# Patient Record
Sex: Male | Born: 1940 | Race: Black or African American | Hispanic: No | Marital: Married | Smoking: Former smoker
Health system: Southern US, Community
[De-identification: ages and names within clinical notes are randomized; demographics above are authoritative.]

## PROBLEM LIST (undated history)

## (undated) DIAGNOSIS — C799 Secondary malignant neoplasm of unspecified site: Secondary | ICD-10-CM

## (undated) DIAGNOSIS — C801 Malignant (primary) neoplasm, unspecified: Secondary | ICD-10-CM

## (undated) DIAGNOSIS — Z5189 Encounter for other specified aftercare: Secondary | ICD-10-CM

## (undated) DIAGNOSIS — R011 Cardiac murmur, unspecified: Secondary | ICD-10-CM

## (undated) HISTORY — PX: OTHER SURGICAL HISTORY: SHX169

## (undated) HISTORY — PX: ROTATOR CUFF REPAIR: SHX139

## (undated) HISTORY — PX: COLON SURGERY: SHX602

---
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
## 1998-10-04 ENCOUNTER — Ambulatory Visit: Admission: RE | Admit: 1998-10-04 | Discharge: 1998-10-04 | Payer: Self-pay | Admitting: Family Medicine

## 2001-05-01 ENCOUNTER — Ambulatory Visit (HOSPITAL_COMMUNITY): Admission: RE | Admit: 2001-05-01 | Discharge: 2001-05-01 | Payer: Self-pay | Admitting: Family Medicine

## 2001-05-01 ENCOUNTER — Encounter: Payer: Self-pay | Admitting: Family Medicine

## 2001-09-19 ENCOUNTER — Encounter: Payer: Self-pay | Admitting: Family Medicine

## 2001-09-19 ENCOUNTER — Ambulatory Visit (HOSPITAL_COMMUNITY): Admission: RE | Admit: 2001-09-19 | Discharge: 2001-09-19 | Payer: Self-pay | Admitting: Family Medicine

## 2004-05-09 ENCOUNTER — Emergency Department (HOSPITAL_COMMUNITY): Admission: EM | Admit: 2004-05-09 | Discharge: 2004-05-09 | Payer: Self-pay | Admitting: Emergency Medicine

## 2007-05-01 HISTORY — PX: ROBOT ASSISTED LAPAROSCOPIC RADICAL PROSTATECTOMY: SHX5141

## 2007-09-24 ENCOUNTER — Inpatient Hospital Stay (HOSPITAL_COMMUNITY): Admission: AD | Admit: 2007-09-24 | Discharge: 2007-09-25 | Payer: Self-pay | Admitting: Internal Medicine

## 2007-11-15 ENCOUNTER — Ambulatory Visit: Payer: Self-pay | Admitting: Internal Medicine

## 2007-11-15 ENCOUNTER — Inpatient Hospital Stay (HOSPITAL_COMMUNITY): Admission: EM | Admit: 2007-11-15 | Discharge: 2007-11-19 | Payer: Self-pay | Admitting: Emergency Medicine

## 2007-11-17 ENCOUNTER — Encounter (INDEPENDENT_AMBULATORY_CARE_PROVIDER_SITE_OTHER): Payer: Self-pay | Admitting: Internal Medicine

## 2007-12-03 ENCOUNTER — Ambulatory Visit (HOSPITAL_COMMUNITY): Admission: RE | Admit: 2007-12-03 | Discharge: 2007-12-03 | Payer: Self-pay | Admitting: Urology

## 2008-01-01 ENCOUNTER — Inpatient Hospital Stay (HOSPITAL_COMMUNITY): Admission: RE | Admit: 2008-01-01 | Discharge: 2008-01-02 | Payer: Self-pay | Admitting: Urology

## 2008-01-01 ENCOUNTER — Encounter (INDEPENDENT_AMBULATORY_CARE_PROVIDER_SITE_OTHER): Payer: Self-pay | Admitting: Urology

## 2008-01-10 ENCOUNTER — Inpatient Hospital Stay (HOSPITAL_COMMUNITY): Admission: EM | Admit: 2008-01-10 | Discharge: 2008-01-12 | Payer: Self-pay | Admitting: Emergency Medicine

## 2008-01-27 ENCOUNTER — Ambulatory Visit: Admission: RE | Admit: 2008-01-27 | Discharge: 2008-02-03 | Payer: Self-pay | Admitting: Radiation Oncology

## 2008-04-16 ENCOUNTER — Ambulatory Visit: Admission: RE | Admit: 2008-04-16 | Discharge: 2008-06-10 | Payer: Self-pay | Admitting: Radiation Oncology

## 2008-07-30 ENCOUNTER — Ambulatory Visit: Admission: RE | Admit: 2008-07-30 | Discharge: 2008-10-28 | Payer: Self-pay | Admitting: Radiation Oncology

## 2009-04-11 ENCOUNTER — Encounter (INDEPENDENT_AMBULATORY_CARE_PROVIDER_SITE_OTHER): Payer: Self-pay | Admitting: Surgery

## 2009-04-11 ENCOUNTER — Inpatient Hospital Stay (HOSPITAL_COMMUNITY): Admission: RE | Admit: 2009-04-11 | Discharge: 2009-04-14 | Payer: Self-pay | Admitting: Surgery

## 2009-04-25 ENCOUNTER — Inpatient Hospital Stay (HOSPITAL_COMMUNITY): Admission: AD | Admit: 2009-04-25 | Discharge: 2009-05-05 | Payer: Self-pay | Admitting: Surgery

## 2009-04-25 ENCOUNTER — Encounter: Admission: RE | Admit: 2009-04-25 | Discharge: 2009-04-25 | Payer: Self-pay | Admitting: Surgery

## 2009-05-16 ENCOUNTER — Encounter: Admission: RE | Admit: 2009-05-16 | Discharge: 2009-05-16 | Payer: Self-pay | Admitting: Surgery

## 2009-08-01 ENCOUNTER — Ambulatory Visit (HOSPITAL_COMMUNITY): Admission: RE | Admit: 2009-08-01 | Discharge: 2009-08-01 | Payer: Self-pay | Admitting: Family Medicine

## 2009-08-05 ENCOUNTER — Inpatient Hospital Stay (HOSPITAL_COMMUNITY): Admission: RE | Admit: 2009-08-05 | Discharge: 2009-08-08 | Payer: Self-pay | Admitting: Surgery

## 2009-08-05 ENCOUNTER — Encounter (INDEPENDENT_AMBULATORY_CARE_PROVIDER_SITE_OTHER): Payer: Self-pay | Admitting: Surgery

## 2009-11-25 ENCOUNTER — Ambulatory Visit (HOSPITAL_COMMUNITY): Admission: RE | Admit: 2009-11-25 | Discharge: 2009-11-25 | Payer: Self-pay | Admitting: Urology

## 2009-12-08 ENCOUNTER — Ambulatory Visit (HOSPITAL_COMMUNITY): Admission: RE | Admit: 2009-12-08 | Discharge: 2009-12-08 | Payer: Self-pay | Admitting: Urology

## 2010-03-10 ENCOUNTER — Emergency Department (HOSPITAL_COMMUNITY): Admission: EM | Admit: 2010-03-10 | Discharge: 2010-03-10 | Payer: Self-pay | Admitting: Emergency Medicine

## 2010-03-17 ENCOUNTER — Ambulatory Visit (HOSPITAL_COMMUNITY)
Admission: RE | Admit: 2010-03-17 | Discharge: 2010-03-17 | Payer: Self-pay | Source: Home / Self Care | Admitting: Urology

## 2010-04-30 HISTORY — PX: EYE SURGERY: SHX253

## 2010-05-21 ENCOUNTER — Encounter: Payer: Self-pay | Admitting: Surgery

## 2010-06-21 ENCOUNTER — Other Ambulatory Visit: Payer: Self-pay | Admitting: Urology

## 2010-06-21 ENCOUNTER — Ambulatory Visit (HOSPITAL_COMMUNITY)
Admission: RE | Admit: 2010-06-21 | Discharge: 2010-06-21 | Disposition: A | Discharge status: Home / Self Care | Payer: Medicare Other | Source: Ambulatory Visit | Attending: Urology | Admitting: Urology

## 2010-06-21 ENCOUNTER — Encounter (HOSPITAL_COMMUNITY): Payer: Medicare Other

## 2010-06-21 DIAGNOSIS — Z01812 Encounter for preprocedural laboratory examination: Secondary | ICD-10-CM | POA: Insufficient documentation

## 2010-06-21 DIAGNOSIS — Z01818 Encounter for other preprocedural examination: Secondary | ICD-10-CM | POA: Insufficient documentation

## 2010-06-21 LAB — CBC
HCT: 38.5 % — ABNORMAL LOW (ref 39.0–52.0)
MCHC: 30.9 g/dL (ref 30.0–36.0)
Platelets: 223 10*3/uL (ref 150–400)
RDW: 14.7 % (ref 11.5–15.5)
WBC: 5.6 10*3/uL (ref 4.0–10.5)

## 2010-06-21 LAB — BASIC METABOLIC PANEL
Calcium: 9.4 mg/dL (ref 8.4–10.5)
GFR calc Af Amer: 54 mL/min — ABNORMAL LOW (ref >60)
GFR calc non Af Amer: 45 mL/min — ABNORMAL LOW (ref >60)
Potassium: 4.5 mEq/L (ref 3.5–5.1)
Sodium: 139 mEq/L (ref 135–145)

## 2010-06-21 LAB — SURGICAL PCR SCREEN
MRSA, PCR: NEGATIVE
Staphylococcus aureus: NEGATIVE

## 2010-06-22 ENCOUNTER — Ambulatory Visit (HOSPITAL_COMMUNITY)
Admission: RE | Admit: 2010-06-22 | Discharge: 2010-06-22 | Disposition: A | Discharge status: Home / Self Care | Payer: Medicare Other | Source: Ambulatory Visit | Attending: Urology | Admitting: Urology

## 2010-06-22 DIAGNOSIS — N189 Chronic kidney disease, unspecified: Secondary | ICD-10-CM | POA: Insufficient documentation

## 2010-06-22 DIAGNOSIS — C801 Malignant (primary) neoplasm, unspecified: Secondary | ICD-10-CM | POA: Insufficient documentation

## 2010-06-22 DIAGNOSIS — Z85038 Personal history of other malignant neoplasm of large intestine: Secondary | ICD-10-CM | POA: Insufficient documentation

## 2010-06-22 DIAGNOSIS — C61 Malignant neoplasm of prostate: Secondary | ICD-10-CM | POA: Insufficient documentation

## 2010-06-22 DIAGNOSIS — N135 Crossing vessel and stricture of ureter without hydronephrosis: Secondary | ICD-10-CM | POA: Insufficient documentation

## 2010-06-22 DIAGNOSIS — Z01812 Encounter for preprocedural laboratory examination: Secondary | ICD-10-CM | POA: Insufficient documentation

## 2010-06-24 NOTE — Op Note (Signed)
  NAME:  Willie Gaines, Willie Gaines                 ACCOUNT NO.:  [id number]  MEDICAL RECORD NO.:  [id number]           PATIENT TYPE:  O  LOCATION:  DAYL                         FACILITY:  [facility]  PHYSICIAN:  Heloise Purpura, MD      DATE OF BIRTH:  1941/01/10  DATE OF PROCEDURE:  06/22/2010 DATE OF DISCHARGE:                              OPERATIVE REPORT   PREOPERATIVE DIAGNOSES: 1. Metastatic prostate cancer. 2. Left ureteral obstruction. 3. Chronic kidney disease.  POSTOPERATIVE DIAGNOSIS: 1. Metastatic prostate cancer. 2. Left ureteral obstruction. 3. Chronic kidney disease.  PROCEDURES: 1. Cystoscopy. 2. Left ureteral stent change (6 x 24).  SURGEON:  Heloise Purpura, MD  ANESTHESIA:  General.  COMPLICATIONS:  None.  INDICATIONS:  [name] is a 70 year old gentleman with left ureteral obstruction thought to be related to metastatic prostate cancer.  His left ureteral obstruction has been managed with an indwelling left ureteral stent, and he presents today for stent change.  The potential risks, complications, and alternative treatment options have been discussed in detail and informed consent obtained.  DESCRIPTION OF PROCEDURE:  The patient was taken to the operating room and a general anesthetic was administered.  He was given preoperative antibiotics, placed in the dorsal lithotomy position, and prepped and draped in the usual sterile fashion.  Next, a preoperative time-out was performed.  Cystourethroscopy was then performed which revealed a normal anterior urethra.  The prostate was surgically absent, and there was no evidence of a bladder neck contracture.  Inspection of the bladder revealed a partially encrusted left ureteral stent.  This was able to be brought out to the urethral meatus with flexible graspers without difficulty.  A 0.038 Sensor guidewire was then advanced through the stent up into the renal pelvis without significant difficulty.  The wire was then back  loaded on the cystoscope and a new 6 x 24 double-J ureteral stent was advanced over the wire using Seldinger technique.  He was positioned appropriately under fluoroscopic and cystoscopic guidance, and the wire was removed with a good curl noted in the renal pelvis as well as in the bladder.  The bladder was emptied and the procedure was ended.  He tolerated the procedure well and without complications.  He was able to be awakened and transferred to the recovery unit in satisfactory condition.     Heloise Purpura, MD     LB/MEDQ  D:  06/22/2010  T:  06/23/2010  Job:  [id number]  Electronically Signed by Heloise Purpura MD on 06/24/2010 01:20:03 PM

## 2010-07-11 LAB — COMPREHENSIVE METABOLIC PANEL
ALT: 20 U/L (ref 0–53)
Albumin: 4 g/dL (ref 3.5–5.2)
Calcium: 9.3 mg/dL (ref 8.4–10.5)
Glucose, Bld: 126 mg/dL — ABNORMAL HIGH (ref 70–99)
Sodium: 137 mEq/L (ref 135–145)
Total Protein: 7.2 g/dL (ref 6.0–8.3)

## 2010-07-11 LAB — BASIC METABOLIC PANEL
BUN: 25 mg/dL — ABNORMAL HIGH (ref 6–23)
CO2: 23 mEq/L (ref 19–32)
Calcium: 9.4 mg/dL (ref 8.4–10.5)
Chloride: 105 mEq/L (ref 96–112)
Creatinine, Ser: 1.74 mg/dL — ABNORMAL HIGH (ref 0.4–1.5)
Creatinine, Ser: 2.31 mg/dL — ABNORMAL HIGH (ref 0.4–1.5)
GFR calc Af Amer: 47 mL/min — ABNORMAL LOW (ref >60)
GFR calc non Af Amer: 28 mL/min — ABNORMAL LOW (ref >60)
Glucose, Bld: 94 mg/dL (ref 70–99)
Glucose, Bld: 96 mg/dL (ref 70–99)
Potassium: 4.4 mEq/L (ref 3.5–5.1)
Sodium: 137 mEq/L (ref 135–145)

## 2010-07-11 LAB — SURGICAL PCR SCREEN
MRSA, PCR: NEGATIVE
Staphylococcus aureus: NEGATIVE

## 2010-07-11 LAB — CBC
HCT: 35.2 % — ABNORMAL LOW (ref 39.0–52.0)
MCHC: 33.3 g/dL (ref 30.0–36.0)
Platelets: 272 10*3/uL (ref 150–400)
RDW: 15.4 % (ref 11.5–15.5)
WBC: 5 10*3/uL (ref 4.0–10.5)

## 2010-07-14 LAB — BASIC METABOLIC PANEL
CO2: 24 mEq/L (ref 19–32)
Glucose, Bld: 101 mg/dL — ABNORMAL HIGH (ref 70–99)
Potassium: 4.2 mEq/L (ref 3.5–5.1)
Sodium: 140 mEq/L (ref 135–145)

## 2010-07-14 LAB — CBC
HCT: 36.5 % — ABNORMAL LOW (ref 39.0–52.0)
Hemoglobin: 12.1 g/dL — ABNORMAL LOW (ref 13.0–17.0)
MCV: 70 fL — ABNORMAL LOW (ref 78.0–100.0)
RBC: 5.21 MIL/uL (ref 4.22–5.81)
WBC: 5.6 10*3/uL (ref 4.0–10.5)

## 2010-07-16 LAB — COMPREHENSIVE METABOLIC PANEL
ALT: 16 U/L (ref 0–53)
CO2: 22 mEq/L (ref 19–32)
Calcium: 7.6 mg/dL — ABNORMAL LOW (ref 8.4–10.5)
GFR calc non Af Amer: >60 mL/min (ref >60)
Glucose, Bld: 123 mg/dL — ABNORMAL HIGH (ref 70–99)
Sodium: 132 mEq/L — ABNORMAL LOW (ref 135–145)

## 2010-07-16 LAB — CBC
HCT: 31 % — ABNORMAL LOW (ref 39.0–52.0)
Hemoglobin: 10 g/dL — ABNORMAL LOW (ref 13.0–17.0)
Hemoglobin: 10.1 g/dL — ABNORMAL LOW (ref 13.0–17.0)
Hemoglobin: 9.3 g/dL — ABNORMAL LOW (ref 13.0–17.0)
MCHC: 32.7 g/dL (ref 30.0–36.0)
MCHC: 32.7 g/dL (ref 30.0–36.0)
MCV: 73.8 fL — ABNORMAL LOW (ref 78.0–100.0)
MCV: 74.4 fL — ABNORMAL LOW (ref 78.0–100.0)
Platelets: 597 10*3/uL — ABNORMAL HIGH (ref 150–400)
RBC: 4.13 MIL/uL — ABNORMAL LOW (ref 4.22–5.81)
RDW: 18.7 % — ABNORMAL HIGH (ref 11.5–15.5)
RDW: 19 % — ABNORMAL HIGH (ref 11.5–15.5)

## 2010-07-16 LAB — DIFFERENTIAL
Basophils Absolute: 0 10*3/uL (ref 0.0–0.1)
Lymphocytes Relative: 9 % — ABNORMAL LOW (ref 12–46)
Monocytes Absolute: 0.8 10*3/uL (ref 0.1–1.0)
Neutro Abs: 7 10*3/uL (ref 1.7–7.7)
Neutrophils Relative %: 80 % — ABNORMAL HIGH (ref 43–77)

## 2010-07-16 LAB — CREATININE, SERUM: GFR calc non Af Amer: >60 mL/min (ref >60)

## 2010-07-16 LAB — PREALBUMIN: Prealbumin: 14.6 mg/dL — ABNORMAL LOW (ref 18.0–45.0)

## 2010-07-19 LAB — BASIC METABOLIC PANEL
BUN: 16 mg/dL (ref 6–23)
BUN: 21 mg/dL (ref 6–23)
CO2: 21 mEq/L (ref 19–32)
Calcium: 9.4 mg/dL (ref 8.4–10.5)
Chloride: 109 mEq/L (ref 96–112)
Chloride: 109 mEq/L (ref 96–112)
Creatinine, Ser: 1.5 mg/dL (ref 0.4–1.5)
Creatinine, Ser: 1.53 mg/dL — ABNORMAL HIGH (ref 0.4–1.5)
GFR calc Af Amer: 46 mL/min — ABNORMAL LOW (ref >60)
GFR calc Af Amer: 55 mL/min — ABNORMAL LOW (ref >60)
GFR calc non Af Amer: 38 mL/min — ABNORMAL LOW (ref >60)
GFR calc non Af Amer: 46 mL/min — ABNORMAL LOW (ref >60)
Glucose, Bld: 92 mg/dL (ref 70–99)
Potassium: 3.9 mEq/L (ref 3.5–5.1)
Potassium: 4.3 mEq/L (ref 3.5–5.1)
Potassium: 4.3 mEq/L (ref 3.5–5.1)
Sodium: 133 mEq/L — ABNORMAL LOW (ref 135–145)
Sodium: 135 mEq/L (ref 135–145)

## 2010-07-19 LAB — CBC
HCT: 28.9 % — ABNORMAL LOW (ref 39.0–52.0)
HCT: 38 % — ABNORMAL LOW (ref 39.0–52.0)
Hemoglobin: 9 g/dL — ABNORMAL LOW (ref 13.0–17.0)
MCHC: 32.6 g/dL (ref 30.0–36.0)
MCHC: 32.6 g/dL (ref 30.0–36.0)
MCV: 72.4 fL — ABNORMAL LOW (ref 78.0–100.0)
MCV: 72.8 fL — ABNORMAL LOW (ref 78.0–100.0)
Platelets: 265 10*3/uL (ref 150–400)
Platelets: 323 10*3/uL (ref 150–400)
RBC: 3.81 MIL/uL — ABNORMAL LOW (ref 4.22–5.81)
RDW: 17.6 % — ABNORMAL HIGH (ref 11.5–15.5)
WBC: 5.8 10*3/uL (ref 4.0–10.5)
WBC: 5.8 10*3/uL (ref 4.0–10.5)
WBC: 6.9 10*3/uL (ref 4.0–10.5)

## 2010-07-31 LAB — CBC
HCT: 21.7 % — ABNORMAL LOW (ref 39.0–52.0)
HCT: 24.7 % — ABNORMAL LOW (ref 39.0–52.0)
HCT: 28 % — ABNORMAL LOW (ref 39.0–52.0)
Hemoglobin: 7.8 g/dL — ABNORMAL LOW (ref 13.0–17.0)
Hemoglobin: 7.9 g/dL — ABNORMAL LOW (ref 13.0–17.0)
Hemoglobin: 9.1 g/dL — ABNORMAL LOW (ref 13.0–17.0)
MCHC: 32.7 g/dL (ref 30.0–36.0)
MCV: 72.9 fL — ABNORMAL LOW (ref 78.0–100.0)
Platelets: 171 10*3/uL (ref 150–400)
Platelets: 417 10*3/uL — ABNORMAL HIGH (ref 150–400)
Platelets: 539 10*3/uL — ABNORMAL HIGH (ref 150–400)
RBC: 3.19 MIL/uL — ABNORMAL LOW (ref 4.22–5.81)
RBC: 3.37 MIL/uL — ABNORMAL LOW (ref 4.22–5.81)
RDW: 15.5 % (ref 11.5–15.5)
RDW: 16.3 % — ABNORMAL HIGH (ref 11.5–15.5)
RDW: 17.7 % — ABNORMAL HIGH (ref 11.5–15.5)
WBC: 8.2 10*3/uL (ref 4.0–10.5)

## 2010-07-31 LAB — CROSSMATCH

## 2010-07-31 LAB — BASIC METABOLIC PANEL
BUN: 17 mg/dL (ref 6–23)
BUN: 19 mg/dL (ref 6–23)
CO2: 25 mEq/L (ref 19–32)
Calcium: 6.8 mg/dL — ABNORMAL LOW (ref 8.4–10.5)
Calcium: 7.1 mg/dL — ABNORMAL LOW (ref 8.4–10.5)
Creatinine, Ser: 1.72 mg/dL — ABNORMAL HIGH (ref 0.4–1.5)
GFR calc Af Amer: >60 mL/min (ref >60)
GFR calc Af Amer: >60 mL/min (ref >60)
GFR calc non Af Amer: 40 mL/min — ABNORMAL LOW (ref >60)
GFR calc non Af Amer: 59 mL/min — ABNORMAL LOW (ref >60)
GFR calc non Af Amer: >60 mL/min (ref >60)
GFR calc non Af Amer: >60 mL/min (ref >60)
Glucose, Bld: 102 mg/dL — ABNORMAL HIGH (ref 70–99)
Glucose, Bld: 138 mg/dL — ABNORMAL HIGH (ref 70–99)
Potassium: 3.7 mEq/L (ref 3.5–5.1)
Potassium: 3.9 mEq/L (ref 3.5–5.1)
Sodium: 132 mEq/L — ABNORMAL LOW (ref 135–145)
Sodium: 132 mEq/L — ABNORMAL LOW (ref 135–145)
Sodium: 135 mEq/L (ref 135–145)

## 2010-07-31 LAB — URINE MICROSCOPIC-ADD ON

## 2010-07-31 LAB — ANAEROBIC CULTURE

## 2010-07-31 LAB — URINALYSIS, ROUTINE W REFLEX MICROSCOPIC
Glucose, UA: NEGATIVE mg/dL
Nitrite: NEGATIVE
Specific Gravity, Urine: 1.026 (ref 1.005–1.030)
pH: 5 (ref 5.0–8.0)

## 2010-07-31 LAB — HEMOGLOBIN: Hemoglobin: 7.4 g/dL — ABNORMAL LOW (ref 13.0–17.0)

## 2010-07-31 LAB — LIPID PANEL: VLDL: 29 mg/dL (ref 0–40)

## 2010-07-31 LAB — LIPASE, BLOOD: Lipase: <10 U/L — ABNORMAL LOW (ref 11–59)

## 2010-07-31 LAB — TISSUE CULTURE

## 2010-07-31 LAB — HEMOGLOBIN AND HEMATOCRIT, BLOOD: Hemoglobin: 7.6 g/dL — ABNORMAL LOW (ref 13.0–17.0)

## 2010-08-01 LAB — CBC
HCT: 26.2 % — ABNORMAL LOW (ref 39.0–52.0)
HCT: 34.1 % — ABNORMAL LOW (ref 39.0–52.0)
Hemoglobin: 13.7 g/dL (ref 13.0–17.0)
MCHC: 31.7 g/dL (ref 30.0–36.0)
MCHC: 32.5 g/dL (ref 30.0–36.0)
MCHC: 32.5 g/dL (ref 30.0–36.0)
MCV: 72.3 fL — ABNORMAL LOW (ref 78.0–100.0)
MCV: 72.7 fL — ABNORMAL LOW (ref 78.0–100.0)
MCV: 72.9 fL — ABNORMAL LOW (ref 78.0–100.0)
Platelets: 196 10*3/uL (ref 150–400)
Platelets: 256 10*3/uL (ref 150–400)
RBC: 3.6 MIL/uL — ABNORMAL LOW (ref 4.22–5.81)
RDW: 15.3 % (ref 11.5–15.5)
RDW: 15.5 % (ref 11.5–15.5)
WBC: 10.8 10*3/uL — ABNORMAL HIGH (ref 4.0–10.5)
WBC: 8.3 10*3/uL (ref 4.0–10.5)

## 2010-08-01 LAB — BASIC METABOLIC PANEL
BUN: 9 mg/dL (ref 6–23)
CO2: 26 mEq/L (ref 19–32)
Chloride: 105 mEq/L (ref 96–112)
Potassium: 3.9 mEq/L (ref 3.5–5.1)

## 2010-08-01 LAB — POTASSIUM: Potassium: 4.3 mEq/L (ref 3.5–5.1)

## 2010-08-01 LAB — CREATININE, SERUM: GFR calc Af Amer: >60 mL/min (ref >60)

## 2010-09-12 NOTE — H&P (Signed)
NAME:  Willie Gaines, CASTENEDA                 ACCOUNT NO.:  [id number]   MEDICAL RECORD NO.:  [id number]          PATIENT TYPE:  INP   LOCATION:  1228                         FACILITY:  [facility]   PHYSICIAN:  Bertram Millard. Dahlstedt, M.D.DATE OF BIRTH:  03/16/1941   DATE OF ADMISSION:  01/10/2008  DATE OF DISCHARGE:                              HISTORY & PHYSICAL   REASON FOR ADMISSION:  History of recent prostatectomy with significant  lower pelvic pain.   BRIEF HISTORY:  Willie Gaines is a 70 year old male who is just over a  week out from a robotic assisted laparoscopic radical prostatectomy and  bilateral pelvic lymph node dissection.  That was performed on September  3 by Dr. Heloise Purpura.  The patient had an uneventful postoperative  recovery.  He followed up with Dr. Laverle Patter in the office yesterday and  had a voiding trial.  He voided well and the catheter was left out.  About 8:00 at night yesterday he started experiencing some abdominal  pain.  Overnight, he had a low urinary output.  He called me this  morning and stated that he has had some abdominal pain, had rectal  urgency and I recommended that he come to the emergency room.  That was  at approximately 9:00 a.m.  He did not present until about 2:00 p.m., as  his wife did not want to bring him in because he was sleeping.  At 2:00  he had increased abdominal pain.  He still had rectal urgency which came  in spells.  He had some nausea and vomiting as well as heaves.  He has  not had fever or chills.  He has had a low urinary output, dribbling  fairly clear urine.  For the past few days he has been passing gas and  has had a few bowel movements.  His last bowel movement was yesterday.  In the emergency room it took a fair amount of pain medicine to get the  patient comfortable.  At this time chemistries are pending except for a  creatinine of 1.47.  His hematocrit is 48%, platelets are normal and  white count is not elevated - it is 9000.   CT with p.o. contrast was  eventually performed.  I viewed the images.  There is expected bilateral  iliac edema.  There is no significant free fluid in the abdomen.  There  is no significant perirectal edema or fluid collection.  There is a  minimal amount of urine within the bladder.  The patient is being  admitted for pain management, observation and IV antibiotic  administration.   PAST MEDICAL HISTORY:  Is significant for a UTI with sepsis after  transrectal ultrasound biopsy of the prostate.  He spent 3 days in the  hospital back in July.  He is status post appendectomy years ago.  He  has borderline hypertension and hyperlipidemia, both better controlled  without medications.   ADMISSION MEDICATIONS:  At time of admission included just Vicodin and  Bactrim.   ALLERGIES AND INTOLERANCES:  The patient apparently has nausea from  AMOXICILLIN.  No  specific allergies, however.   SOCIAL HISTORY:  The patient is married.  He is retired.  He does not  use tobacco or alcohol.   FAMILY HISTORY:  Significant for pancreatic cancer.   PHYSICAL EXAMINATION:  GENERAL:  Exam revealed a pleasant but sedated 35-  year-old male, appearing younger than his stated age.  VITAL SIGNS:  As recorded.  He had no supraclavicular cervical  adenopathy.  LUNGS:  Lung sounds were clear bilaterally.  HEART:  Normal rate and rhythm.  He had a grade 2/6 systolic ejection  murmur heard best at the left upper sternal border.  ABDOMEN/GU:  Abdomen was slightly distended.  Laparoscopic incisions  were all healing well.  There is no redness or discharge.  No specific  tenderness in these areas.  His abdomen was slightly tender in the  suprapubic area.  There was no guarding or rigidity.  There was no CVA  tenderness.  Bowel sounds were diminished.  Phallus circumcised.  No  lesions.  No discharge of blood at the meatus.  Scrotal skin and  testicles normal.  He had no peripheral edema.   LABORATORY:  At this  time include basically a normal CBC and a pending  comprehensive metabolic panel (this has been drawn twice but specimen  was inadequate).  Creatinine was 1.47.  CT urogram as dictated in the  HPI.   IMPRESSION:  1. Prostate cancer.  Status post robotic assisted laparoscopic radical      prostatectomy on the 3rd of this month.  Pathologic stage T3B N0      MX.  Gleason 4+3.  Less than 24 hours after removal of his catheter      he has had increased pelvic pain.  2. Pelvic/lower abdominal pain.  I am not quite sure of the etiology      of this yet.  I do suspect that he may have something related to      the catheter coming out yesterday - perhaps a very small urine      leak.  I am not concerned at this point about significant bowel      injury as he is a week out.  However, we need to rule out      perirectal injury.   PLAN:  1. I will admit and place on broad-spectrum IV antibiotics, pain      medications and IV hydration.  2. He will be n.p.o. at the present time.  3. In the morning I will repeat a CT scan with bladder, followed by      rectal contrast.  4. I have spoken with the patient and his family regarding our plan of      action.      Bertram Millard. Dahlstedt, [name]  Electronically Signed     SMD/MEDQ  D:  01/10/2008  T:  01/11/2008  Job:  [id number]

## 2010-09-12 NOTE — Discharge Summary (Signed)
NAME:  Willie Gaines, Willie Gaines                 ACCOUNT NO.:  [id number]   MEDICAL RECORD NO.:  [id number]          PATIENT TYPE:  INP   LOCATION:  1416                         FACILITY:  [facility]   PHYSICIAN:  Hollice Espy, M.D.DATE OF BIRTH:  [birth year]   DATE OF ADMISSION:  11/15/2007  DATE OF DISCHARGE:  11/19/2007                               DISCHARGE SUMMARY   PRIMARY CARE PHYSICIAN:  [name] A. Nicholos Johns, [name]   UROLOGIST:  Lindaann Slough, [name]   DISCHARGE DIAGNOSES:  1. Escherichia coli urinary tract infection.  2. Secondary sepsis.  3. Urinary retention secondary to enlarged prostate.  4. Microcytic anemia.   HOSPITAL COURSE:  The patient is a 70 year old African American male  with a history of borderline hypertension, hyperlipidemia, not on any  medications, who has been following with Dr. Brunilda Payor for an enlarged  prostate and underwent a prostate biopsy on November 13, 2007.  One day  later, he started complaining of fevers, chills and fatigue, and by November 15, 2007, he was feeling worse and weak.  He came into the emergency  room.  In the emergency room, he was noted be hypotensive and  tachycardic, had a temperature of 102.7 and a heart rate of 118.  Initial urinalysis  was not very impressive, and the attending physician  noted the patient to have a 2/6 systolic ejection murmur.  There was a  concern about the possibility of endocarditis, and the patient was  started on vancomycin and gentamicin with a 2-D echo ordered.  By  hospital day 2, the patient was still feeling rough and febrile.  He was  having extreme difficulty urinating, complaining of severe pain and  unable to void.  Given his urology history, I suspected that he may have  urinary retention leading to secondary UTI and possible sepsis.  A Foley  catheter was placed.  A repeat urinalysis was sent.  Please note that  the patient received Cipro before his procedure on the 16th prior to  coming in.  On the evening of  hospital day 2, the patient's blood  cultures came back positive for gram-negative rods.  At this point, he  was changed over to IV Rocephin.  He continued to spike temperatures,  and his antibiotics were changed to IV Primaxin given a penicillin  allergy and vancomycin because at this point it was not confirmed yet by  echo whether he did indeed have endocarditis and despite being on  Rocephin had spiked further febrile temps.  By November 17, 2007, the  patient was feeling better.  His white count had finally come down to  9.6.  His cultures came back noted as E. coli sensitive to  cephalosporins but resistant to Cipro.  The patient was tolerating well  on Primaxin and vancomycin.  A 2-D echo was done to ensure that there  were no other sources of infection.  This came back negative for any  signs of endocarditis although some moderate tricuspid regurg was noted.  By November 17, 2007, the patient was doing well.  I spoke with Dr. Brunilda Payor  about the patient's prostate biopsy results.  These are not yet back,  and Dr. Brunilda Payor said that he would follow up with the patient upon  discharge for these biopsy results.  In addition, when I notified him  about the patient's difficulty voiding, he recommended starting the  patient on Flomax 0.4 mg, and he said that the patient's Foley could be  discontinued prior to discharge.  In the interim prior to this, I spoke  with the patient about whether or not he had any cataracts.  He tells me  that he does not, and therefore we find it is safe to go ahead and start  Flomax in the interim.  By July 21, the patient is feeling markedly  better.  The plan will be to discontinue the Foley today, continue  Flomax, making sure that he voids.  We will also discontinue vancomycin  given that we have seen no evidence of any gram-positive infection.  If  the patient does well, then the plan will be to discharge him home on  November 19, 2007, with p.o. Ceftin.  He will follow up  either on July 22 or  July 23 with Dr. Brunilda Payor of urology.  The patient's other medical issues  were stable during this hospitalization.  Given his mild findings of  heart disease based on his echo, would recommend starting him on  antihypertensives down the line, but we will defer of course to his PCP.  The patient's overall disposition from initial presentation is improved.  Activity will be slowly increased.  His discharge diet will be a low-  sodium diet, and he will follow up with Dr. Brunilda Payor upon discharge and Dr.  Nicholos Johns 1 week after discharge.  He will be discharged to home.   DISCHARGE MEDICATIONS:  1. Ceftin 500 mg p.o. b.i.d. x11 days.  2. Flomax 0.4 p.o. daily.      Hollice Espy, [name]  Electronically Signed     SKK/MEDQ  D:  11/18/2007  T:  11/18/2007  Job:  [id number]   cc:   Lindaann Slough, [name]  Fax: [phone]   Elana Alm. Nicholos Johns, [name]  Fax: [phone]

## 2010-09-12 NOTE — H&P (Signed)
NAME:  Willie Gaines, Willie Gaines                 ACCOUNT NO.:  [id number]   MEDICAL RECORD NO.:  [id number]          PATIENT TYPE:  INP   LOCATION:  5120                         FACILITY:  [facility]   PHYSICIAN:  Hollice Espy, M.D.DATE OF BIRTH:  11-23-1940   DATE OF ADMISSION:  09/24/2007  DATE OF DISCHARGE:                              HISTORY & PHYSICAL   PRIMARY CARE PHYSICIAN:  [name], [name]   CHIEF COMPLAINT:  Abdominal pain with nausea and vomiting.   HISTORY OF PRESENT ILLNESS:  The patient is a 70 year old African-  American male with a past medical history of borderline hypertension and  hyperlipidemia who for the last several days has had the problems with  nausea and vomiting unable to keep anything down.  When he was evaluated  by Dr. Benjaman Pott partner, Dr. Wynelle Link, today, in the office; there was a  concern that the patient may have pancreatitis.  He was complaining of  some mid epigastric to left upper quadrant abdominal pain.  The patient  had labs done in the office which showed an amylase level elevated to  264, but a lipase level was only 52.  The rest of his labs were  evaluated, and were essentially normal; but with these findings, Dr. Wynelle Link  was concerned about the possibility of pancreatitis; and because the  patient was unable to keep anything down, it was felt best that he come  in for further evaluation and treatment.  She contacted the [facility]  hospitalist, and the patient was made as a direct admission.   The patient came up to the floor, and on the floor the patient was  complaining of some mild abdominal pain.  He feels slightly nauseated.  He describes the abdominal pain as mostly in the left upper quadrant, as  well as, he complains of some burning and pain starting from the  midepigastric area going up, consistent with some esophageal pain.  He  denies any headaches, vision changes, or dysphagia.  He denies any chest  pain, palpitations shortness breath, wheeze,  or cough.  He describes  abdominal pain as above, but no hematuria, dysuria, constipation,  diarrhea, bloody stools, dark black, or bright red.  No focal extremity  numbness, weakness, or pain.   REVIEW OF SYSTEMS:  Otherwise negative.   PAST MEDICAL HISTORY INCLUDES:  1. Hypertension which is borderline and currently being watched.  2. Hyperlipidemia which he took himself off of medication for.  3. A recent tooth extraction status post he took amoxicillin for a      dental cavity.  4. Obesity.   MEDICATIONS:  1. The patient recently took a course of amoxicillin.  2. He was on Lipitor, but stopped taking this.  3. He is not on any antihypertensives.   ALLERGIES:  No known drug allergies.   SOCIAL HISTORY:  No tobacco, heavy alcohol, or drug use.   FAMILY HISTORY:  Noted for pancreatic cancer.   PHYSICAL EXAMINATION:  VITALS:  On admission temperature 99.4, heart  rate 79, respirations 20, blood pressure 165/90, O2 saturation 99% on  room air.  GENERAL:  He is alert and oriented x3 in no apparent distress.  HEENT:  Normocephalic atraumatic.  His mucous membranes are moist.  NECK:  He has no carotid bruits.  HEART:  Regular rate and rhythm, S1-S2, with a 2/6 systolic ejection  murmur.  LUNGS:  Clear to auscultation bilaterally.  ABDOMEN:  Soft, nontender some mild distention.  Hypoactive bowel  sounds.  EXTREMITIES:  No clubbing, cyanosis, or edema.   LAB WORK:  I am repeating his CMET, lipase and CBC.  In the meantime,  his labs in the office, note a sodium 137, potassium 4.2, chloride 98,  bicarb 27, BUN 9, creatinine 0.9, glucose is 140.  LFTs are noted for an  albumin of 4.1, but essentially unremarkable.  His lipase level was on  the high end of normal at 52, his amylase level was at 264.  He has a  white count of 10.7 with an 83.8% shift.  His H&H is 14.7/45 with an MCV  of 71.   ASSESSMENT AND PLAN:  1. Abdominal pain.  This is more left-sided, upper quadrant with  some      what looks to be severe reflux.  His lipase is borderline, so there      is a question of whether or not this is true pancreatitis.  We will      repeat his labs, and check an abdominal CT with contrast, while in      the meantime symptomatically controlled.  2. Hypertension, borderline.  This is slightly elevated now because of      pain.  Continue to monitor.  3. History of hyperlipidemia.  4. Microcytic anemia.  No history in review of old records.  We will      further work this up.      Hollice Espy, [name]  Electronically Signed     SKK/MEDQ  D:  09/24/2007  T:  09/24/2007  Job:  [id number]   cc:   [name] A. Nicholos Johns, [name]

## 2010-09-12 NOTE — Discharge Summary (Signed)
NAME:  Willie Gaines, Willie Gaines                 ACCOUNT NO.:  [id number]   MEDICAL RECORD NO.:  [id number]          PATIENT TYPE:  INP   LOCATION:  1426                         FACILITY:  [facility]   PHYSICIAN:  Heloise Purpura, MD      DATE OF BIRTH:  1941/01/15   DATE OF ADMISSION:  01/01/2008  DATE OF DISCHARGE:  01/02/2008                               DISCHARGE SUMMARY   ADMISSION DIAGNOSIS:  Clinically localized adenocarcinoma of the  prostate versus possibly locally advanced adenocarcinoma of the  prostate.   POSTOPERATIVE DIAGNOSIS:  Clinically localized adenocarcinoma of the  prostate versus possibly locally advanced adenocarcinoma of the  prostate.   HISTORY AND PHYSICAL:  For full details, please see admission history  and physical.  Briefly, Willie Gaines is a 70 year old otherwise healthy  gentleman who was recently diagnosed with a high-grade, clinically  localized, adenocarcinoma of the prostate.  His digital rectal exam was  concerning for possibly locally advanced disease.  After a thorough  discussion regarding management options for treatment including androgen  deprivation in conjunction with external beam radiation therapy versus  multimodality therapy with a primary surgical approach, the patient  elected to proceed with radical prostatectomy and extended pelvic  lymphadenectomy.   HOSPITAL COURSE:  On January 01, 2008, the patient was taken to the  operating room and underwent a robotic-assisted laparoscopic radical  prostatectomy and extended bilateral pelvic lymphadenectomy.  He  tolerated this procedure well and without complications.  Postoperatively, was able to be transferred to a regular hospital room  following recovery from anesthesia.  His able to begin ambulating the  night of surgery, which he did without difficulty.  He was monitored  that evening and remained hemodynamically stable.  His hematocrit was  checked on postoperative day #1 and was found to be stable at  37.2.  He  did have excellent urine output, although did have slightly increased  output from his pelvic drain.  He was begun on a clear liquid diet which  he tolerated without difficulty and was transitioned to oral pain  medication.  His JP fluid was checked for a creatinine level on  postoperative day #1, due to the slightly increased output, and was  found to be 2.3.  A basic metabolic panel was then drawn and his serum  creatinine was found to be 1.2.  Based on  urine output, it was felt that if he did have a urine leak that it was  quite small.  In addition, it was felt that his JP creatinine level was  due likely to some urine that was in the field during surgery, but  likely did not result in a significant urine leak.  His drain was,  therefore, removed.  Based on the fact that he did not appear to be  developing renal insufficiency and was otherwise doing well, it was  decided to have him discharged home in excellent condition.   DISPOSITION:  Home.   DISCHARGE MEDICATIONS:  He was instructed to begin Vicodin as needed for  pain and told to use Colace as a stool softener.  Based on his history  of a resistant E. coli prostatitis, he was given antibiotic therapy with  Bactrim which did cover his previously resistant E. coli.   DISCHARGE INSTRUCTIONS:  He was instructed to be ambulatory but  specifically told to refrain from any heavy lifting, strenuous activity,  or driving.  He was given instructions on routine Foley catheter care.   FOLLOW UP:  He will follow-up in 1 week for removal of the Foley  catheter.  I will plan to have him undergo a cystogram at that time to  exclude any urine leak.      Heloise Purpura, MD  Electronically Signed     LB/MEDQ  D:  01/02/2008  T:  01/04/2008  Job:  [id number]

## 2010-09-12 NOTE — H&P (Signed)
NAME:  Willie Gaines, PROCH                 ACCOUNT NO.:  [id number]   MEDICAL RECORD NO.:  [id number]          PATIENT TYPE:  INP   LOCATION:  0102                         FACILITY:  [facility]   PHYSICIAN:  Therisa Doyne, MD    DATE OF BIRTH:  [birth year]   DATE OF ADMISSION:  11/15/2007  DATE OF DISCHARGE:                              HISTORY & PHYSICAL   This is an admission to the [facility] Team.   PRIMARY CARE Dashay Giesler:  Alanson Puls, [name]   CHIEF COMPLAINT:  Fever.   HISTORY OF PRESENT ILLNESS:  A 70 year old African American male who  presents with a past medical history significant for hypertension,  hyperlipidemia, who presents to the emergency room for evaluation of  fevers and fatigue.  The patient had been in his usual state of health  until approximately 1 month when he had oral surgery.  His oral surgery  was somewhat complicated and they recommended that he take amoxicillin  for 10 days.  However after beginning to take the amoxicillin, the  patient experienced nausea and fatigue.  He was seen and evaluated in  the emergency department and felt that his symptoms may be related to  the amoxicillin and this medication was discontinued.  The patient did  well for the next several weeks until November 13, 2007 when he underwent a  prostate biopsy which was approached transrectally.  The procedure went  well on November 13, 2007.  However on November 14, 2007, the patient reported  fevers up to 102, chills, fatigue and a dry cough.  Because of these  symptoms, he came to the emergency department for evaluation.  He denies  any chest pain, shortness of breath, rashes, joint pain or myalgias.   In the emergency department, the patient was given vancomycin and  gentamicin as well as 2 liters of normal saline.   REVIEW OF SYSTEMS:  All systems were reviewed and are negative except as  mentioned above in the history of present illness.   PAST MEDICAL HISTORY:  1. Borderline hypertension not on  any medications.  2. Borderline hyperlipidemia, diet controlled.   MEDICATIONS:  None.   ALLERGIES:  AMOXICILLIN, however, this may have been just a side effect  of nausea and not a true allergic reaction.   SOCIAL HISTORY:  The patient lives in [place].  He is retired.  He  denies tobacco, alcohol or drug use.   FAMILY HISTORY:  Positive for prostate disease.   PHYSICAL EXAMINATION:  VITAL SIGNS:  Temperature 102.7 on admission  which has now defervesced to 98.7, blood pressure 143/93, pulse 118,  respirations 20, oxygen saturation 97% on room air.  GENERAL:  No acute distress.  HEENT:  Normocephalic, atraumatic.  Pupils are equal, round, and  reactive to light and accommodation.  Extraocular movements are intact.  Oropharynx pink and moist without any lesions.  NECK:  Supple.  There is 3-4 cm of jugular venous distention.  CARDIOVASCULAR:  Regular rate and rhythm with a 3/6 systolic murmur  heard best at the left upper sternal border, right upper sternal border  and left lower sternal border.  No rubs or gallops were appreciated.  CHEST:  Clear to auscultation bilaterally.  ABDOMEN:  Positive bowel sounds, soft, nontender, nondistended.  EXTREMITIES:  No clubbing, cyanosis or edema.  SKIN:  No rashes.  BACK:  No CVA tenderness.  NEUROLOGIC:  Nonfocal examination.  No Janeway lesions or Osler's nodes.   LABORATORY VALUES:  White blood cell count 12,000, hemoglobin 14.4,  platelets 194.  CMP revealed potassium 3.4, total bilirubin of 1.4.  Urinalysis showed 7-10 white blood cells, 11-20 red blood cells,  negative nitrates.   DIAGNOSTICS:  1. Chest x-ray:  No acute cardiopulmonary disease.  2. EKG:  Sinus tachycardia with PACs, left atrial enlargement and no      ST/T wave changes.   ASSESSMENT/PLAN:  A 70 year old African American male who presents with  fever up to 102 degrees Fahrenheit, leukocytosis, systolic murmur all of  which are occurring after a recent  urological procedure and recent oral  surgery.   1. We will admit the patient to the [facility], Dr. Aurelio Jew care.   1. Fevers.  Concerning for endocarditis given his recent dental      procedure and urological procedure.  This is supported by the fact      that he has fevers, constitutional symptoms, leukocytosis and a      somewhat new heart murmur.  His heart murmur was noted on his      previous history and physical back in May.  The patient reports      that he did have a transthoracic echocardiogram performed at [facility] several years ago.  Because of the concern      for endocarditis, the patient was given vancomycin and gentamicin      in the emergency department.  We will continue these medications      empirically until more definitive data returns.  At this point, he      does not meet criteria to be diagnosed with endocarditis.  We will      check blood cultures from 2 different sites daily for the next 3      days.  Check transthoracic echocardiogram.  Consider a      transesophageal echocardiogram. Check erythrocytes, sedimentation      rate, CRP, rheumatoid factor and urine culture.   1. Fluids, electrolytes, nutrition:  Normal saline at 75 cc/hour.      Electrolytes are stable. Regular diet.   1. DVT prophylaxis with Lovenox.      Therisa Doyne, MD  Electronically Signed     SJT/MEDQ  D:  11/15/2007  T:  11/15/2007  Job:  [id number]

## 2010-09-12 NOTE — Op Note (Signed)
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   PROCEDURES:  1. Robotic-assisted laparoscopic radical prostatectomy (non-nerve-      sparing).  2. Extended bilateral pelvic lymphadenectomy.   SURGEON:  Heloise Purpura, MD   ASSISTANT:  Delia Chimes, nurse practitioner.   ANESTHESIA:  General.   COMPLICATIONS:  None.   ESTIMATED BLOOD LOSS:  250 mL.   INTRAVENOUS FLUIDS:  2 L of lactated Ringer's.   SPECIMENS:  1. Prostate and seminal vesicles.  2. Right pelvic lymph nodes.  3. Left pelvic lymph nodes.   DISPOSITION OF SPECIMENS:  TO pathology.   DRAINS:  1. A 20-French coude catheter.  2. A #19 Blake pelvic drain.   INDICATIONS:  Mr. 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  He underwent a metastatic  evaluation, which was negative.  We had a thorough discussion regarding  the significant risks that he might have of micro-metastatic disease.  After our discussion, he did elect to proceed with aggressive  multimodality therapy and decided to proceed with a radical  prostatectomy as his initial treatment.  Potential risks, complications,  and alternative options were discussed in detail with the patient and  informed consent was obtained.   DESCRIPTION OF PROCEDURE:  The patient was taken to the operating room  and a general anesthetic  was administered.  He was given preoperative  antibiotics, placed in the dorsal lithotomy position, and prepped and  draped in the usual sterile fashion.  Next a preoperative time-out was  performed.  A Foley catheter was then inserted into the bladder and a  site was selected just to the left of the umbilicus for placement of the  camera port.  This was placed using a standard open Hasson technique,  which allowed entry into the peritoneal cavity under direct vision  without difficulty.  A 12-mm port was then placed and a 0-degree lens  was used to inspect the abdomen and there was no evidence for any intra-  abdominal injuries or other abnormalities.  The remaining ports were  then placed bilateral 8-mm robotic ports placed 10 cm lateral to and  just inferior to the camera port site.  An additional 8-mm robotic port  was placed in the far left lateral abdominal wall.  A 5-mm port was  placed between the camera port and the right robotic port and an  additional 12-mm port was placed in the far right lateral abdominal wall  for laparoscopic assistance.  All ports were placed under direct vision  and without difficulty.  The surgical cart was then docked.  With the  aid of the cautery scissors, the bladder was reflected posteriorly  allowing entry into space of Retzius and identification of the  endopelvic fascia and prostate.  The endopelvic fascia was then incised  from the apex back to the base of the prostate bilaterally and the  underlying levator muscle fibers were swept laterally off the prostate.  This isolated the dorsal venous complex, which was then stapled and  divided with a 45-mm flex ETS stapler.  The bladder neck was identified  with the aid of Foley catheter manipulation and was divided anteriorly,  thereby exposing the Foley catheter.  The catheter balloon was deflated  and the catheter was brought into the operative field and used to  retract the prostate anteriorly.   This exposed the posterior bladder  neck, which was then divided, and dissection proceeded posteriorly  between the bladder and prostate until the vasa deferentia and seminal  vesicles were identified.  The vasa deferentia were isolated, divided  and lifted anteriorly.  These structures were noted to be markedly  fibrotic and adherent to the surrounding structures.  The seminal  vesicles were also identified and appeared to be extremely adherent to  the surrounding fatty tissue.  These structures were dissected down to  their tips with care to control the seminal vesicle arterial blood  supply and then lifted anteriorly.  The patient did have a history of  prostatitis after his prostate biopsy due to a resistant E. coli  infection.  He was administered broad-spectrum IV antibiotics of  vancomycin and gentamicin prior to his operation today.  During the  dissection posteriorly, there was not noted to be any clear plane  between the prostate and the rectum that was easily separated.  Therefore, careful sharp dissection was used to enter this plane and to  expand this plane distally.  The plane was subsequently able to be  developed allowing the pedicles of prostate to be isolated.  The  vascular pedicles of the prostate were then ligated in a wide, non-nerve-  sparing fashion with Hem-o-lok clips and divided with scissor  dissection.  The urethra was then sharply divided allowing the prostate  specimen to be disarticulated.  The pelvis was then copiously irrigated  and with irrigation in the pelvis, air was injected into the rectal  catheter and there was no evidence for a rectal injury.  Hemostasis was  ensured.  Attention then turned to the right pelvic sidewall.  And  extended pelvic lymphadenectomy was performed due to the patient's high-  risk disease.  The boundaries of the lymph node dissection included the  external iliac artery, confluence of the iliac vessels, hypogastric  artery,  and Cooper's ligament.  Care was taken to avoid injury to the  obturator nerve and Hem-o-lok clips were used for lymphostasis and  hemostasis.  This was then repeated with an identical procedure  performed on the contralateral side.  Attention then turned to the  urethral anastomosis.  A 2-0 Vicryl slip-knot was placed between the cut  end of Denonvilliers fascia, the posterior bladder neck and the  posterior urethra to help reapproximate these structures.  A double-  armed 3-0 Monocryl suture was then used to perform a 360-degrees running  tension-free anastomosis between the bladder neck and urethra.  A new 20-  [place] coude catheter was inserted into the bladder  and irrigated.  There were no blood clots within the bladder and the anastomosis  appeared to be watertight.  A #19 Blake pelvic drain was then brought  through the left robotic port and appropriately positioned in the  pelvis.  It was secured to the skin with a nylon suture.  At this point  the surgical cart was undocked.  The right lateral 12-mm port site was  closed with a 0 Vicryl suture placed with the aid of the suture passer  device.  The prostate specimen and bilateral pelvic lymph node packets,  which had been marked, were removed via the periumbilical port site with  the Endopouch retrieval bag.  This fascial opening was closed with a  running 0 Vicryl suture.  All ports were removed under direct  vision.  The port sites were then injected with 0.25% Marcaine and  reapproximated the skin with staples.  Sterile dressings were applied.  The patient appeared to tolerate the procedure well and without  complications.  He was able to be extubated and transferred to the  recovery unit in satisfactory condition.      Heloise Purpura, MD  Electronically Signed     LB/MEDQ  D:  01/01/2008  T:  01/01/2008  Job:  [id number]

## 2010-09-12 NOTE — Discharge Summary (Signed)
NAME:  Willie Gaines, Willie Gaines                 ACCOUNT NO.:  [id number]   MEDICAL RECORD NO.:  [id number]          PATIENT TYPE:  INP   LOCATION:  1434                         FACILITY:  [facility]   PHYSICIAN:  Heloise Purpura, MD      DATE OF BIRTH:  07/21/1940   DATE OF ADMISSION:  01/10/2008  DATE OF DISCHARGE:  01/12/2008                               DISCHARGE SUMMARY   ADMISSION DIAGNOSIS:  1. Prostate cancer.  2. Pelvic pain.   POSTOPERATIVE DIAGNOSIS:  1. Prostate cancer.  2. Pelvic pain.   ADMISSION HISTORY AND PHYSICAL:  For full details, please see admission  history and physical.  Briefly, [name] is a 70 year old gentleman  with locally advanced prostate cancer status post a robotic  prostatectomy and pelvic lymphadenectomy approximately one week ago.  He  returned to the clinic approximately 3 days ago and underwent a voiding  trial which he passed.  He subsequently went home and was doing well  until he developed pelvic pain the following day.  He described this as  severe spasms of the rectum and bladder.  He returned to the emergency  department for further evaluation and due to the severe pain, underwent  an evaluation including an abdominal and pelvic CT scan.  This did not  demonstrate any obvious objective findings concerning for abscess or  urinoma.  He was admitted for pain control and underwent a subsequent CT  of the pelvis which did demonstrate findings that were consistent with a  small urine leak.  The patient did have a Foley catheter that had been  replaced and was left to drainage.  He maintained excellent urine output  and his pelvic pain gradually improved.  He was able to be transitioned  to oral pain medication, did have continued bowel function and was  tolerating a regular diet.  Therefore, he will be discharged home on  January 12, 2008, in excellent condition.   DISPOSITION:  Home.   DISCHARGE MEDICATIONS:  He was instructed to resume his regular  home  medications.  It was decided to keep him on antibiotic therapy.  He,  therefore, was sent home on Bactrim double strength one tablet p.o.  b.i.d. for one week.  In addition, he was given a prescription for  Percocet to take as needed for pain.   DISCHARGE INSTRUCTIONS:  He was instructed to resume activity as before  his admission.  He was also instructed that he may resume a regular  diet.  Finally, he was instructed on routine Foley catheter care and  given a leg bag for daytime usage.   FOLLOW UP:  He will be scheduled to follow-up in one week where he will  undergo a cystogram and be considered for catheter removal at that time.      Heloise Purpura, MD  Electronically Signed     LB/MEDQ  D:  01/12/2008  T:  01/13/2008  Job:  [id number]

## 2010-10-13 ENCOUNTER — Other Ambulatory Visit: Payer: Self-pay | Admitting: Urology

## 2010-10-13 ENCOUNTER — Encounter (HOSPITAL_COMMUNITY): Payer: Medicare Other

## 2010-10-13 LAB — CBC
HCT: 39.5 % (ref 39.0–52.0)
MCHC: 31.6 g/dL (ref 30.0–36.0)
MCV: 69.8 fL — ABNORMAL LOW (ref 78.0–100.0)
Platelets: 267 10*3/uL (ref 150–400)
RDW: 14.5 % (ref 11.5–15.5)

## 2010-10-13 LAB — BASIC METABOLIC PANEL
BUN: 20 mg/dL (ref 6–23)
Creatinine, Ser: 1.47 mg/dL — ABNORMAL HIGH (ref 0.50–1.35)
GFR calc Af Amer: 57 mL/min — ABNORMAL LOW (ref >60)
GFR calc non Af Amer: 47 mL/min — ABNORMAL LOW (ref >60)

## 2010-10-19 ENCOUNTER — Ambulatory Visit (HOSPITAL_COMMUNITY)
Admission: RE | Admit: 2010-10-19 | Discharge: 2010-10-19 | Disposition: A | Discharge status: Home / Self Care | Payer: Medicare Other | Source: Ambulatory Visit | Attending: Urology | Admitting: Urology

## 2010-10-19 DIAGNOSIS — N135 Crossing vessel and stricture of ureter without hydronephrosis: Secondary | ICD-10-CM | POA: Insufficient documentation

## 2010-10-19 DIAGNOSIS — Z79899 Other long term (current) drug therapy: Secondary | ICD-10-CM | POA: Insufficient documentation

## 2010-10-19 DIAGNOSIS — Z01812 Encounter for preprocedural laboratory examination: Secondary | ICD-10-CM | POA: Insufficient documentation

## 2010-10-19 DIAGNOSIS — C801 Malignant (primary) neoplasm, unspecified: Secondary | ICD-10-CM | POA: Insufficient documentation

## 2010-10-19 DIAGNOSIS — Z9079 Acquired absence of other genital organ(s): Secondary | ICD-10-CM | POA: Insufficient documentation

## 2010-10-19 DIAGNOSIS — C61 Malignant neoplasm of prostate: Secondary | ICD-10-CM | POA: Insufficient documentation

## 2010-10-19 NOTE — Op Note (Signed)
  NAME:  Willie Gaines, MCFETRIDGE                 ACCOUNT NO.:  [id number]  MEDICAL RECORD NO.:  [id number]  LOCATION:  DAYL                         FACILITY:  [facility]  PHYSICIAN:  Heloise Purpura, MD      DATE OF BIRTH:  Mar 30, 1941  DATE OF PROCEDURE:  10/19/2010 DATE OF DISCHARGE:  10/19/2010                              OPERATIVE REPORT   PREOPERATIVE DIAGNOSIS:  Left ureteral obstruction.  POSTOPERATIVE DIAGNOSIS:  Left ureteral obstruction.  PROCEDURE: 1. Cystoscopy. 2. Left ureteral stent change (6 x 24).  SURGEON:  Heloise Purpura, [name]  ANESTHESIA:  General.  COMPLICATIONS:  None.  INDICATIONS:  Mr. Arata is a 70 year old gentleman with metastatic prostate cancer.  He has left ureteral obstruction as a result of his metastatic prostate cancer, which has been managed with chronic left ureteral stent changes.  He presents today for stent change.  The potential risks, complications and alternative treatment options have been discussed in detail and informed consent obtained.  DESCRIPTION OF PROCEDURE:  The patient was taken to the operating room and a general anesthetic was administered.  He was given preoperative antibiotics, placed in the dorsal lithotomy position and prepped and draped in the usual sterile fashion.  Next, the preoperative time-out was performed.  Cystourethroscopy was then performed, which revealed a normal anterior urethra.  The prostatic urethra was surgically absent. Inspection of the bladder revealed the distal curl of a left indwelling ureteral stent, which did appear to be encrusted distally.  The flexible graspers were then used to pull this stent out to the urethra without difficulty.  A 0.038 sensor guidewire was then advanced up to the stent into the left renal pelvis under fluoroscopic guidance.  The stent was removed and examined.  It only appeared to be encrusted distally without any other encrusted areas.  The wire was then back loaded on  the cystoscope and a new 6 x 24 double-J ureteral stent was advanced over the wire using Seldinger technique and positioned appropriately under fluoroscopic and cystoscopic guidance.  The wire was removed with good curl noted in the renal pelvis as well as in the bladder.  The patient tolerated the procedure well without complications.  He was able to be awakened and transferred to the recovery unit in satisfactory condition.     Heloise Purpura, MD     LB/MEDQ  D:  10/19/2010  T:  10/19/2010  Job:  [id number]  Electronically Signed by Heloise Purpura MD on 10/19/2010 11:37:11 PM

## 2010-11-13 ENCOUNTER — Other Ambulatory Visit (HOSPITAL_COMMUNITY): Payer: Self-pay | Admitting: Urology

## 2010-11-13 DIAGNOSIS — C61 Malignant neoplasm of prostate: Secondary | ICD-10-CM

## 2010-12-18 ENCOUNTER — Encounter (HOSPITAL_COMMUNITY)
Admission: RE | Admit: 2010-12-18 | Discharge: 2010-12-18 | Disposition: A | Discharge status: Home / Self Care | Payer: Medicare Other | Source: Ambulatory Visit | Attending: Urology | Admitting: Urology

## 2010-12-18 ENCOUNTER — Ambulatory Visit (HOSPITAL_COMMUNITY)
Admission: RE | Admit: 2010-12-18 | Discharge: 2010-12-18 | Disposition: A | Discharge status: Home / Self Care | Payer: Medicare Other | Source: Ambulatory Visit | Attending: Urology | Admitting: Urology

## 2010-12-18 DIAGNOSIS — C7951 Secondary malignant neoplasm of bone: Secondary | ICD-10-CM | POA: Insufficient documentation

## 2010-12-18 DIAGNOSIS — C7952 Secondary malignant neoplasm of bone marrow: Secondary | ICD-10-CM | POA: Insufficient documentation

## 2010-12-18 DIAGNOSIS — C61 Malignant neoplasm of prostate: Secondary | ICD-10-CM | POA: Insufficient documentation

## 2010-12-18 MED ORDER — TECHNETIUM TC 99M MEDRONATE IV KIT
25.0000 | PACK | Freq: Once | INTRAVENOUS | Status: AC | PRN
  Administered 2010-12-18: 25 via INTRAVENOUS

## 2011-01-24 LAB — COMPREHENSIVE METABOLIC PANEL
Albumin: 4
BUN: 10
Calcium: 9.5
Creatinine, Ser: 0.99
Total Bilirubin: 0.9
Total Protein: 7.6

## 2011-01-24 LAB — DIFFERENTIAL
Basophils Absolute: 0.1
Lymphocytes Relative: 16
Monocytes Absolute: 0.5
Monocytes Relative: 5
Neutro Abs: 8.6 — ABNORMAL HIGH

## 2011-01-24 LAB — CBC
HCT: 48
MCHC: 32.3
MCV: 70.9 — ABNORMAL LOW
Platelets: 300
RDW: 15

## 2011-01-24 LAB — LIPASE, BLOOD: Lipase: 19

## 2011-01-26 LAB — CBC
HCT: 39.4
HCT: 40.5
HCT: 42.2
HCT: 44.3
Hemoglobin: 12.8 — ABNORMAL LOW
Hemoglobin: 13.2
Hemoglobin: 14.4
MCHC: 32.4
MCV: 70.2 — ABNORMAL LOW
MCV: 70.3 — ABNORMAL LOW
MCV: 70.5 — ABNORMAL LOW
MCV: 70.7 — ABNORMAL LOW
Platelets: 145 — ABNORMAL LOW
Platelets: 148 — ABNORMAL LOW
Platelets: 188
RBC: 5.75
RBC: 6.31 — ABNORMAL HIGH
RDW: 14.7
RDW: 15.3
WBC: 12 — ABNORMAL HIGH
WBC: 12.2 — ABNORMAL HIGH
WBC: 9.6

## 2011-01-26 LAB — CULTURE, BLOOD (ROUTINE X 2)
Culture: NO GROWTH
Culture: NO GROWTH

## 2011-01-26 LAB — URINALYSIS, ROUTINE W REFLEX MICROSCOPIC
Glucose, UA: NEGATIVE
Protein, ur: 30 — AB
pH: 5.5

## 2011-01-26 LAB — COMPREHENSIVE METABOLIC PANEL
Albumin: 3.6
Alkaline Phosphatase: 63
BUN: 10
CO2: 23
Chloride: 105
Creatinine, Ser: 1.2
GFR calc non Af Amer: >60
Glucose, Bld: 143 — ABNORMAL HIGH
Potassium: 3.4 — ABNORMAL LOW
Total Bilirubin: 1.4 — ABNORMAL HIGH

## 2011-01-26 LAB — BASIC METABOLIC PANEL
BUN: 6
BUN: 7
Chloride: 103
Chloride: 106
Creatinine, Ser: 1.16
GFR calc Af Amer: >60
GFR calc non Af Amer: >60
GFR calc non Af Amer: >60
Glucose, Bld: 108 — ABNORMAL HIGH
Potassium: 3.8
Sodium: 139

## 2011-01-26 LAB — DIFFERENTIAL
Basophils Absolute: 0
Basophils Relative: 0
Eosinophils Absolute: 0
Lymphocytes Relative: 5 — ABNORMAL LOW
Monocytes Absolute: 0.8
Neutrophils Relative %: 88 — ABNORMAL HIGH

## 2011-01-26 LAB — URINE CULTURE: Colony Count: 40000

## 2011-01-26 LAB — URINE MICROSCOPIC-ADD ON

## 2011-01-26 LAB — C-REACTIVE PROTEIN: CRP: 18.7 — ABNORMAL HIGH (ref <0.6)

## 2011-01-31 LAB — BASIC METABOLIC PANEL
CO2: 22
CO2: 23
Calcium: 8.5
Chloride: 109
Creatinine, Ser: 1.19
Creatinine, Ser: 1.43
GFR calc Af Amer: 59 — ABNORMAL LOW
GFR calc Af Amer: >60
GFR calc non Af Amer: 49 — ABNORMAL LOW
Sodium: 132 — ABNORMAL LOW
Sodium: 139

## 2011-01-31 LAB — CBC
HCT: 47
Hemoglobin: 12.1 — ABNORMAL LOW
Hemoglobin: 14.8
MCHC: 32.3
RBC: 5.23
RBC: 6.65 — ABNORMAL HIGH
RDW: 15.1

## 2011-01-31 LAB — COMPREHENSIVE METABOLIC PANEL
ALT: 16
ALT: UNDETERMINED
AST: 15
Albumin: 3.5
Alkaline Phosphatase: 68
Alkaline Phosphatase: UNDETERMINED
BUN: 11
BUN: UNDETERMINED
CO2: 23
CO2: UNDETERMINED
Calcium: 8.9
Chloride: 103
Chloride: UNDETERMINED
Creatinine, Ser: 1.84 — ABNORMAL HIGH
GFR calc Af Amer: 44 — ABNORMAL LOW
GFR calc non Af Amer: 37 — ABNORMAL LOW
GFR calc non Af Amer: 47 — ABNORMAL LOW
Glucose, Bld: 121 — ABNORMAL HIGH
Glucose, Bld: 165 — ABNORMAL HIGH
Potassium: 5
Potassium: UNDETERMINED
Sodium: 133 — ABNORMAL LOW
Sodium: UNDETERMINED
Total Bilirubin: 0.7
Total Bilirubin: UNDETERMINED
Total Protein: 7.1

## 2011-01-31 LAB — DIFFERENTIAL
Basophils Absolute: 0.3 — ABNORMAL HIGH
Basophils Relative: 3 — ABNORMAL HIGH
Eosinophils Absolute: 0
Monocytes Absolute: 0.5
Neutrophils Relative %: 73

## 2011-01-31 LAB — URINALYSIS, ROUTINE W REFLEX MICROSCOPIC
Glucose, UA: NEGATIVE
Leukocytes, UA: NEGATIVE
Protein, ur: NEGATIVE
Specific Gravity, Urine: >1.046 — ABNORMAL HIGH
Urobilinogen, UA: 0.2

## 2011-01-31 LAB — URINE MICROSCOPIC-ADD ON

## 2011-01-31 LAB — CREATININE, FLUID (PLEURAL, PERITONEAL, JP DRAINAGE)

## 2011-01-31 LAB — URINE CULTURE: Culture: NO GROWTH

## 2011-01-31 LAB — HEMOGLOBIN AND HEMATOCRIT, BLOOD: Hemoglobin: 12.1 — ABNORMAL LOW

## 2011-02-14 ENCOUNTER — Encounter (HOSPITAL_COMMUNITY): Payer: Medicare Other

## 2011-02-14 ENCOUNTER — Other Ambulatory Visit: Payer: Self-pay | Admitting: Urology

## 2011-02-14 LAB — CBC
Hemoglobin: 12.3 g/dL — ABNORMAL LOW (ref 13.0–17.0)
MCV: 70.5 fL — ABNORMAL LOW (ref 78.0–100.0)
Platelets: 238 10*3/uL (ref 150–400)
RBC: 5.36 MIL/uL (ref 4.22–5.81)
WBC: 5 10*3/uL (ref 4.0–10.5)

## 2011-02-14 LAB — BASIC METABOLIC PANEL
CO2: 24 mEq/L (ref 19–32)
Chloride: 105 mEq/L (ref 96–112)
Glucose, Bld: 103 mg/dL — ABNORMAL HIGH (ref 70–99)
Sodium: 137 mEq/L (ref 135–145)

## 2011-02-14 LAB — SURGICAL PCR SCREEN
MRSA, PCR: NEGATIVE
Staphylococcus aureus: NEGATIVE

## 2011-02-16 ENCOUNTER — Ambulatory Visit (HOSPITAL_COMMUNITY)
Admission: RE | Admit: 2011-02-16 | Discharge: 2011-02-16 | Disposition: A | Discharge status: Home / Self Care | Payer: Medicare Other | Source: Ambulatory Visit | Attending: Urology | Admitting: Urology

## 2011-02-16 DIAGNOSIS — Z01810 Encounter for preprocedural cardiovascular examination: Secondary | ICD-10-CM | POA: Insufficient documentation

## 2011-02-16 DIAGNOSIS — Z466 Encounter for fitting and adjustment of urinary device: Secondary | ICD-10-CM | POA: Insufficient documentation

## 2011-02-16 DIAGNOSIS — N135 Crossing vessel and stricture of ureter without hydronephrosis: Secondary | ICD-10-CM | POA: Insufficient documentation

## 2011-02-16 DIAGNOSIS — C801 Malignant (primary) neoplasm, unspecified: Secondary | ICD-10-CM | POA: Insufficient documentation

## 2011-02-16 DIAGNOSIS — C61 Malignant neoplasm of prostate: Secondary | ICD-10-CM | POA: Insufficient documentation

## 2011-02-16 DIAGNOSIS — Z01812 Encounter for preprocedural laboratory examination: Secondary | ICD-10-CM | POA: Insufficient documentation

## 2011-02-18 NOTE — Op Note (Signed)
  NAME:  Willie Gaines, SCHNORR                 ACCOUNT NO.:  [id number]  MEDICAL RECORD NO.:  [id number]  LOCATION:  DAYL                         [facility]AC[facility]  PHYSICIAN:  Heloise Purpura, MD      DATE OF BIRTH:  01/28/1941  DATE OF PROCEDURE:  02/16/2011 DATE OF DISCHARGE:                              OPERATIVE REPORT   PREOPERATIVE DIAGNOSES: 1. Left ureteral obstruction. 2. Metastatic prostate cancer.  POSTOPERATIVE DIAGNOSES: 1. Left ureteral obstruction. 2. Metastatic prostate cancer.  PROCEDURES: 1. Cystoscopy. 2. Left ureteral stent change.  SURGEON:  Heloise Purpura, MD  ANESTHESIA:  General.  COMPLICATIONS:  None.  ESTIMATED BLOOD LOSS:  Minimal.  INDICATION:  [name] is a 70 year old gentleman with metastatic prostate cancer with left ureteral obstruction.  This has been managed with an indwelling left ureteral stent.  He presents today for stent change.  The potential risks, complications, and alternative options as well as expected outcomes have been discussed with the patient in detail and informed consent obtained.  DESCRIPTION OF PROCEDURE:  The patient was taken to the operating room and a general anesthetic was administered.  He was given preoperative antibiotics, placed in the dorsal lithotomy position, and prepped and draped in the usual sterile fashion.  Next, a preoperative time-out was performed.  Cystourethroscopy was performed which revealed a normal anterior and posterior urethra.  Inspection of the bladder revealed an indwelling left ureteral stent with significant encrustation of the distal curl.  This was able to be brought out to the urethral meatus with the aid of the flexible graspers.  Due to the encrustation, a wire could not be placed through this stent and it was simply removed and this was done without difficulty.  A 0.38 Sensor guidewire was then advanced into the left ureter under cystoscopic guidance and advanced up the ureter into the  renal pelvis under fluoroscopy.  A new 6 x 24 double- J ureteral stent was then advanced over the wire using Seldinger technique and positioned appropriately under fluoroscopic and cystoscopic guidance.  The wire was then removed with a good curl noted in the renal pelvis as well as in the bladder.  The patient tolerated the procedure well without complications.  He was able to be awakened and transferred to the recovery unit in satisfactory condition.     Heloise Purpura, MD     LB/MEDQ  D:  02/16/2011  T:  02/16/2011  Job:  [id number]  Electronically Signed by Heloise Purpura MD on 02/18/2011 10:01:58 PM

## 2011-05-08 ENCOUNTER — Ambulatory Visit (INDEPENDENT_AMBULATORY_CARE_PROVIDER_SITE_OTHER): Payer: Self-pay | Admitting: Family Medicine

## 2011-05-08 DIAGNOSIS — Z713 Dietary counseling and surveillance: Secondary | ICD-10-CM

## 2011-05-17 ENCOUNTER — Other Ambulatory Visit: Payer: Self-pay | Admitting: Urology

## 2011-05-22 ENCOUNTER — Encounter (HOSPITAL_COMMUNITY): Payer: Self-pay | Admitting: Pharmacy Technician

## 2011-05-23 ENCOUNTER — Encounter (HOSPITAL_COMMUNITY): Payer: Self-pay

## 2011-05-23 ENCOUNTER — Encounter (HOSPITAL_COMMUNITY)
Admission: RE | Admit: 2011-05-23 | Discharge: 2011-05-23 | Disposition: A | Discharge status: Home / Self Care | Payer: Medicare Other | Source: Ambulatory Visit | Attending: Urology | Admitting: Urology

## 2011-05-23 HISTORY — DX: Malignant (primary) neoplasm, unspecified: C80.1

## 2011-05-23 HISTORY — DX: Encounter for other specified aftercare: Z51.89

## 2011-05-23 LAB — SURGICAL PCR SCREEN
MRSA, PCR: NEGATIVE
Staphylococcus aureus: NEGATIVE

## 2011-05-23 LAB — CBC
HCT: 38.9 % — ABNORMAL LOW (ref 39.0–52.0)
Hemoglobin: 12.6 g/dL — ABNORMAL LOW (ref 13.0–17.0)
RBC: 5.52 MIL/uL (ref 4.22–5.81)

## 2011-05-23 LAB — BASIC METABOLIC PANEL
Chloride: 103 mEq/L (ref 96–112)
GFR calc Af Amer: 57 mL/min — ABNORMAL LOW (ref >90)
Potassium: 4.3 mEq/L (ref 3.5–5.1)
Sodium: 137 mEq/L (ref 135–145)

## 2011-05-23 NOTE — Patient Instructions (Signed)
20 Willie Gaines  05/23/2011   Your procedure is scheduled on:  06/01/2011 205pm-305 pm  Report to [facility] at 1200pm.  Call this number if you have problems the morning of surgery: [phone]   Remember:   Do not eat food:After Midnight.  May have clear liquids:until Midnight .  [name]  Take these medicines the morning of surgery with A SIP OF WATER:    Do not wear jewelry,   Do not wear lotions, powders, or perfumes.    Do not bring valuables to the hospital.  Contacts, dentures or bridgework may not be worn into surgery.      Patients discharged the day of surgery will not be allowed to drive home.  Name and phone number of your driver:   Special Instructions: CHG Shower Use Special Wash: 1/2 bottle night before surgery and 1/2 bottle morning of surgery. Shower chin to toes with CHG.  Wash face and private parts with regular soap.     Please read over the following fact sheets that you were given: MRSA Information, coughing and deep breathing exercises, leg exercises

## 2011-05-31 NOTE — H&P (Signed)
History of Present Illness     Mr. Willie Gaines is a 71 year old with the following urologic history:  1) Metastatic prostate cancer: He is s/p a non-nerve sparing RAL radical prostectomy on January 01, 2008. His postoperative course was complicated by a urine leak which resolved with catheter drainage. His pathology demonstrated pT3b N0 Mx (16 nodes negative), Gleason 4+3=7 (tertiary pattern 5) adenocarcinoma with positive surgical margins at the seminal vesicles. His PSA initially became undetectable after surgery and he proceeded with adjuvant radiation therapy which he completed in June 2010. He developed a biochemical recurrence in October 2010. 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 He began androgen deprivation with Lupron in August 2011.  2) Left ureteral obstruction: This was diagnosed in August 2011 and it has been managed with ureteral stent drainage. Last stent: 02/16/11 (6 x 24)  3) Testosterone deficiency/osteopenia: He takes Vitamin D and calcium supplementation for osteoporosis prevention. Current therapy: Lupron 30 mg (12/21/10) Last DXA: July 2011 (Osteopenia) Bone treatment: Xgeva 120 mg monthly  Interval history:  He follows up today for continued management of his metastatic prostate cancer in his left ureteral obstruction. He continues to tolerate Lupron relatively well aside from significant fatigue. He also has been taking Xgeva monthly and tolerating this well. He continues to take vitamin D and calcium supplementation. His PSA did begin to increase last fall although subsequently leveled off. He follows up today with his most recent PSA. His voiding symptoms continue to be mildly bothersome but are tolerable and he does not wish to pursue medical therapy.       Past Medical History Problems  1. History of  Acute Pancreatitis 577.0 2. 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 Prostate Cancer 185  Surgical History Problems  1. History of  Appendectomy 2. History of  Cystoscopy With Insertion Of Ureteral Stent Left 3. History of  Cystoscopy With Insertion Of Ureteral Stent Left 4. History of  Cystoscopy With Insertion Of Ureteral Stent Left 5. History of  Cystoscopy With Insertion Of Ureteral Stent Left 6. History of  Ileostomy 7. History of  Ileostomy Closure 8. History of  Partial Colectomy 9. History of  Prostatect Retropubic Radical W/ Nerve Sparing Laparoscopic 10. History of  Rotator Cuff Repair Bilateral  Current Meds 1. Bromday 0.09 % Ophthalmic Solution; Therapy: 22Aug2012 to 2. Calcium Citrate 250 MG Oral Tablet; Therapy: (Recorded:17Apr2012) to 3. Crestor 10 MG Oral Tablet; Therapy: (Recorded:17Apr2012) to 4. EQ Aspirin Low Dose 81 MG Oral Tablet; Therapy: (Recorded:17Apr2012) to 5. Multiple Vitamin TABS; Therapy: (Recorded:28Sep2011) to 6. Prolia 60 MG/ML Subcutaneous Solution; INJECT SUBCUTANEOUSLY  60 MG / 1 ML EVERY 6  MONTHS; To Be Done: 09May2012; Status: HOLD FOR - Administration 7. Xgeva 120 MG/1.7ML Subcutaneous Solution; INJECT 120  MG Subcutaneous Monthly; To Be  Done: 14Nov2012; Status: HOLD FOR - Administration  Allergies Medication  1. No Known Drug Allergies  Family History Problems  1. Paternal history of  Adenocarcinoma Of The Pancreas 2. Maternal history of  Asthma V17.5 3. Fraternal history of  Prostate Cancer V16.42  Social History Problems  1. Alcohol Use 2. Former Smoker V15.82 quit smoking in 1981 3. Marital History - Currently Married 4. Occupation: retired Nurse, children's  5. History of  Tobacco Use  Review of  Systems  Genitourinary: no hematuria.  Constitutional: no fever, no night sweats and no recent weight loss.    Vitals Vital Signs [Data Includes: Last 1  Day]  16Jan2013 02:00PM  BMI Calculated: 31.11 BSA Calculated: 2.11 Height: 5 ft 9 in Weight: 210 lb  Blood Pressure: 130 / 84 Heart Rate: 67  Physical Exam Constitutional: Well nourished and well developed . No acute distress.  Pulmonary: No respiratory distress and normal respiratory rhythm and effort.  Cardiovascular: Heart rate and rhythm are normal . No peripheral edema.    Results/Data Urine [Data Includes: Last 1 Day]   16Jan2013  COLOR YELLOW   APPEARANCE CLOUDY   SPECIFIC GRAVITY 1.020   pH 5.0   GLUCOSE NEG mg/dL  BILIRUBIN NEG   KETONE NEG mg/dL  BLOOD LARGE   PROTEIN 30 mg/dL  UROBILINOGEN 0.2 mg/dL  NITRITE NEG   LEUKOCYTE ESTERASE MOD   SQUAMOUS EPITHELIAL/HPF RARE   WBC 7-10 WBC/hpf  RBC 21-50 RBC/hpf  BACTERIA RARE   CRYSTALS NONE SEEN   CASTS NONE SEEN   Selected Results  PSA 27Dec2012 01:19PM Heloise Purpura  SPECIMEN TYPE: BLOOD   Test Name Result Flag Reference  PSA 3.22 ng/mL  <=4.00  Test Methodology: ECLIA PSA (Electrochemiluminescence Immunoassay)   TESTOSTERONE 27Dec2012 01:19PM Heloise Purpura  SPECIMEN TYPE: BLOOD   Test Name Result Flag Reference  TESTOSTERONE, TOTAL <10.00 ng/dL L [id number]  Result repeated and verified.           Tanner Stage       Male              Male               I              < 30 ng/dL        < 10 ng/dL               II             < 150 ng/dL       < 30 ng/dL               III            100-320 ng/dL     < 35 ng/dL               IV             200-970 ng/dL     04-54 ng/dL               V              250-890 ng/dL     09-81 ng/dL   PSA Flowsheet [Data Includes: Last 3 Years]   ** Printed in Appendix #1 below.    Urine has been cultured.  Assessment Assessed  1. Prostate Cancer 185 2. Ureteral Obstruction 593.4 3. Taking Medication For A Long Time V58.69 4. Metastasis Of Malignant Neoplasm To Bone 198.5 5. Male Stress Incontinence 788.32 6. Hypogonadism 257.2  Plan Health Maintenance (V70.0)    1. UA With REFLEX  Done: 16Jan2013 01:51PM Prostate Cancer (185)  2. Lupron Depot 30 MG Intramuscular Kit; INJECT 30 MG Intramuscular; Done: 16Jan2013  02:39PM; Status: COMPLETE 3. Follow-up Lab Only Office  Follow-up  Requested for: 19Mar2013 4. Follow-up Month x 4 Office  Follow-up 5. Follow-up Month x 4 Office  Follow-up  Requested for: 22May2013 6. BASIC METABOLIC PANEL  Requested for: 19Mar2013  7. PSA  Requested for: 15May2013 8. PSA  Requested for: 19Mar2013 9. TESTOSTERONE  Requested for: 15May2013 10. TESTOSTERONE  Requested for: 19Mar2013 Ureteral Obstruction (593.4)  11. Follow-up Schedule Surgery Office  Follow-up  Done: 16Jan2013  Discussion/Summary  1. Metastatic prostate cancer: His PSA fortunately has decreased slightly with no evidence to suggest obvious progression of his prostate cancer. This would indicate that he has not developed true castrate resistant disease at this time. He will receive Lupron 30 mg today and will have followup laboratory studies including a PSA in 2 months and then further labs and an office visit in 4 months for his next injection.   We did discuss options going forward and he likely will be an excellent candidate for one of the enzalutimide trials. We also discussed the recent data indicating the benefits of abiraterone in the prechemotherapy setting.  2. Left ureteral obstruction: He will be scheduled for his next ureteral stent in the next few weeks. His stent was mildly encrusted at his last stent change which had been 4 months since his prior change. We have reviewed the potential risks and benefits associated with this procedure and he gives his informed consent.  3. Testosterone deficiency/bone health: He will continue osteoporosis prophylaxis with vitamin D and calcium and will continue treatment with Xgeva. His serum calcium level will be checked with his next set of labs.  CC: Dr. Margaretmary Dys Dr. Elias Else   Verified  Results URINE CULTURE1 16Jan2013 [id number] Lyda Perone  Source : [facility] SPECIMEN TYPE: CLEAN CATCH  [May 18, 2011 9:58PM Hermenia Fritcher] Please let patient known culture result. I have called in Levaquin for him to begin 3 days prior to stent change.   Test Name Result Flag Reference  CULTURE, URINE1 Culture, Urine1    ===== COLONY COUNT: =====  >=100,000 COLONIES/ML   FINAL REPORT: ENTEROCOCCUS SPECIES   Sensitivity for: ENTEROCOCCUS SPECIES    AMPICILLIN                             Sensitive        <=2    LEVOFLOXACIN                           Sensitive          1    NITROFURANTOIN                         Sensitive       <=16    VANCOMYCIN                             Sensitive          1    TETRACYCLINE                           Resistant       >=16    END OF REPORT     1. Amended By: Heloise Purpura; 05/18/2011 9:58 PMEST  Signatures Electronically signed by : Heloise Purpura, [name]; May 18 2011  9:58PM

## 2011-06-01 ENCOUNTER — Ambulatory Visit (HOSPITAL_COMMUNITY): Payer: Medicare Other | Admitting: Anesthesiology

## 2011-06-01 ENCOUNTER — Ambulatory Visit (HOSPITAL_COMMUNITY)
Admission: RE | Admit: 2011-06-01 | Discharge: 2011-06-01 | Disposition: A | Discharge status: Home / Self Care | Payer: Medicare Other | Source: Ambulatory Visit | Attending: Urology | Admitting: Urology

## 2011-06-01 ENCOUNTER — Encounter (HOSPITAL_COMMUNITY): Payer: Self-pay | Admitting: *Deleted

## 2011-06-01 ENCOUNTER — Encounter (HOSPITAL_COMMUNITY)
Admission: RE | Disposition: A | Discharge status: Home / Self Care | Payer: Self-pay | Source: Ambulatory Visit | Attending: Urology

## 2011-06-01 ENCOUNTER — Encounter (HOSPITAL_COMMUNITY): Payer: Self-pay | Admitting: Anesthesiology

## 2011-06-01 DIAGNOSIS — C61 Malignant neoplasm of prostate: Secondary | ICD-10-CM | POA: Insufficient documentation

## 2011-06-01 DIAGNOSIS — N393 Stress incontinence (female) (male): Secondary | ICD-10-CM | POA: Insufficient documentation

## 2011-06-01 DIAGNOSIS — E291 Testicular hypofunction: Secondary | ICD-10-CM | POA: Insufficient documentation

## 2011-06-01 DIAGNOSIS — Z01812 Encounter for preprocedural laboratory examination: Secondary | ICD-10-CM | POA: Insufficient documentation

## 2011-06-01 DIAGNOSIS — Z7982 Long term (current) use of aspirin: Secondary | ICD-10-CM | POA: Insufficient documentation

## 2011-06-01 DIAGNOSIS — R5381 Other malaise: Secondary | ICD-10-CM | POA: Insufficient documentation

## 2011-06-01 DIAGNOSIS — M899 Disorder of bone, unspecified: Secondary | ICD-10-CM | POA: Insufficient documentation

## 2011-06-01 DIAGNOSIS — Z79899 Other long term (current) drug therapy: Secondary | ICD-10-CM | POA: Insufficient documentation

## 2011-06-01 DIAGNOSIS — C7951 Secondary malignant neoplasm of bone: Secondary | ICD-10-CM | POA: Insufficient documentation

## 2011-06-01 DIAGNOSIS — C7952 Secondary malignant neoplasm of bone marrow: Secondary | ICD-10-CM | POA: Insufficient documentation

## 2011-06-01 DIAGNOSIS — N135 Crossing vessel and stricture of ureter without hydronephrosis: Secondary | ICD-10-CM | POA: Insufficient documentation

## 2011-06-01 HISTORY — PX: CYSTOSCOPY W/ URETERAL STENT PLACEMENT: SHX1429

## 2011-06-01 SURGERY — CYSTOSCOPY, FLEXIBLE, WITH STENT REPLACEMENT
Anesthesia: General | Wound class: Clean Contaminated

## 2011-06-01 MED ORDER — PROMETHAZINE HCL 25 MG/ML IJ SOLN: 6.2500 mg | INTRAMUSCULAR | Status: DC | PRN

## 2011-06-01 MED ORDER — CIPROFLOXACIN IN D5W 400 MG/200ML IV SOLN
INTRAVENOUS | Status: AC
  Filled 2011-06-01: qty 200

## 2011-06-01 MED ORDER — PROPOFOL 10 MG/ML IV BOLUS
INTRAVENOUS | Status: DC | PRN
  Administered 2011-06-01: 180 mg via INTRAVENOUS

## 2011-06-01 MED ORDER — FENTANYL CITRATE 0.05 MG/ML IJ SOLN: 25.0000 ug | INTRAMUSCULAR | Status: DC | PRN

## 2011-06-01 MED ORDER — ONDANSETRON HCL 4 MG/2ML IJ SOLN
INTRAMUSCULAR | Status: DC | PRN
  Administered 2011-06-01: 4 mg via INTRAVENOUS

## 2011-06-01 MED ORDER — CIPROFLOXACIN IN D5W 400 MG/200ML IV SOLN
400.0000 mg | INTRAVENOUS | Status: AC
  Administered 2011-06-01: 400 mg via INTRAVENOUS

## 2011-06-01 MED ORDER — LACTATED RINGERS IV SOLN: INTRAVENOUS | Status: DC

## 2011-06-01 MED ORDER — SODIUM CHLORIDE 0.9 % IR SOLN
Status: DC | PRN
  Administered 2011-06-01: 3000 mL

## 2011-06-01 MED ORDER — LIDOCAINE HCL (CARDIAC) 20 MG/ML IV SOLN
INTRAVENOUS | Status: DC | PRN
  Administered 2011-06-01: 50 mg via INTRAVENOUS

## 2011-06-01 MED ORDER — MIDAZOLAM HCL 5 MG/5ML IJ SOLN
INTRAMUSCULAR | Status: DC | PRN
  Administered 2011-06-01: 2 mg via INTRAVENOUS

## 2011-06-01 MED ORDER — FENTANYL CITRATE 0.05 MG/ML IJ SOLN
INTRAMUSCULAR | Status: DC | PRN
  Administered 2011-06-01: 50 ug via INTRAVENOUS

## 2011-06-01 MED ORDER — LACTATED RINGERS IV SOLN
INTRAVENOUS | Status: DC | PRN
  Administered 2011-06-01: 14:00:00 via INTRAVENOUS

## 2011-06-01 MED ORDER — IOHEXOL 300 MG/ML  SOLN
INTRAMUSCULAR | Status: AC
  Filled 2011-06-01: qty 1

## 2011-06-01 MED ORDER — DEXAMETHASONE SODIUM PHOSPHATE 10 MG/ML IJ SOLN
INTRAMUSCULAR | Status: DC | PRN
  Administered 2011-06-01: 10 mg via INTRAVENOUS

## 2011-06-01 SURGICAL SUPPLY — 13 items
ADAPTER CATH URET PLST 4-6FR (CATHETERS) ×2 IMPLANT
BAG URO CATCHER STRL LF (DRAPE) ×2 IMPLANT
BASKET ZERO TIP NITINOL 2.4FR (BASKET) IMPLANT
CATH INTERMIT  6FR 70CM (CATHETERS) IMPLANT
CLOTH BEACON ORANGE TIMEOUT ST (SAFETY) ×2 IMPLANT
DRAPE CAMERA CLOSED 9X96 (DRAPES) ×2 IMPLANT
GLOVE BIOGEL M STRL SZ7.5 (GLOVE) ×2 IMPLANT
GOWN STRL NON-REIN LRG LVL3 (GOWN DISPOSABLE) ×4 IMPLANT
GUIDEWIRE ANG ZIPWIRE 038X150 (WIRE) IMPLANT
GUIDEWIRE STR DUAL SENSOR (WIRE) ×2 IMPLANT
MANIFOLD NEPTUNE II (INSTRUMENTS) ×2 IMPLANT
PACK CYSTO (CUSTOM PROCEDURE TRAY) ×2 IMPLANT
TUBING CONNECTING 10 (TUBING) ×2 IMPLANT

## 2011-06-01 NOTE — Anesthesia Postprocedure Evaluation (Signed)
  Anesthesia Post-op Note  Patient: Willie Gaines  Procedure(s) Performed:  CYSTOSCOPY WITH STENT REPLACEMENT - left stent   Patient Location: PACU  Anesthesia Type: General  Level of Consciousness: awake and alert   Airway and Oxygen Therapy: Patient Spontanous Breathing  Post-op Pain: mild  Post-op Assessment: Post-op Vital signs reviewed, Patient's Cardiovascular Status Stable, Respiratory Function Stable, Patent Airway and No signs of Nausea or vomiting  Post-op Vital Signs: stable  Complications: No apparent anesthesia complications

## 2011-06-01 NOTE — Op Note (Signed)
Preoperative diagnosis:  1. Left ureteral obstruction 2. Metastatic prostate cancer   Postoperative diagnosis:  1. Left ureteral obstruction 2. Metastatic prostate cancer   Procedure:  1. Cystoscopy 2. Left ureteral stent placement (6 x 24)  Surgeon: Rolly Salter, Montez Hageman. [name]  Anesthesia: General  Complications: None  Intraoperative findings: Indwelling left ureteral stent was mildly encrusted.  EBL: Minimal  Specimens: None  Indication: Willie Gaines is a 71 y.o. patient with ureteral obstruction. After reviewing the management options for treatment, he elected to proceed with the above surgical procedure(s). We have discussed the potential benefits and risks of the procedure, side effects of the proposed treatment, the likelihood of the patient achieving the goals of the procedure, and any potential problems that might occur during the procedure or recuperation. Informed consent has been obtained.  Description of procedure:  The patient was taken to the operating room and general anesthesia was induced.  The patient was placed in the dorsal lithotomy position, prepped and draped in the usual sterile fashion, and preoperative antibiotics were administered. A preoperative time-out was performed.   Cystourethroscopy was performed.  The patient's urethra was examined and was unremarkable. The bladder was then systematically examined in its entirety. There was no evidence for any bladder tumors, stones, or other mucosal pathology.    Attention then turned to the left ureteral orifice and the patient's indwelling ureteral stent was identified and brought out to the urethral meatus with the flexible graspers.  A 0.38 sensor guidewire was then advanced up the left ureter into the renal pelvis under fluoroscopic guidance.  The wire was then backloaded through the cystoscope and a ureteral stent was advance over the wire using Seldinger technique.  The stent was positioned appropriately  under fluoroscopic and cystoscopic guidance.  The wire was then removed with an adequate stent curl noted in the renal pelvis as well as in the bladder.  The bladder was then emptied and the procedure ended.  The patient appeared to tolerate the procedure well and without complications.  The patient was able to be awakened and transferred to the recovery unit in satisfactory condition.    Moody Bruins MD

## 2011-06-01 NOTE — Addendum Note (Signed)
Addendum  created 06/01/11 1506 by Steve Alivya Wegman, CRNA   Modules edited:Anesthesia Responsible Staff    

## 2011-06-01 NOTE — Anesthesia Preprocedure Evaluation (Addendum)
Anesthesia Evaluation  Patient identified by MRN, date of birth, ID band Patient awake    Reviewed: Allergy & Precautions, H&P , NPO status , Patient's Chart, lab work & pertinent test results  Airway Mallampati: II TM Distance: >3 FB Neck ROM: full    Dental  (+) Caps and Dental Advisory Given All front upper are caps:   Pulmonary neg pulmonary ROS,  clear to auscultation  Pulmonary exam normal       Cardiovascular Exercise Tolerance: Good METS: neg cardio ROS regular Normal    Neuro/Psych Negative Neurological ROS  Negative Psych ROS   GI/Hepatic negative GI ROS, Neg liver ROS,   Endo/Other  Negative Endocrine ROS  Renal/GU negative Renal ROS  Genitourinary negative   Musculoskeletal   Abdominal   Peds  Hematology negative hematology ROS (+)   Anesthesia Other Findings   Reproductive/Obstetrics negative OB ROS                          Anesthesia Physical Anesthesia Plan  ASA: I  Anesthesia Plan: General   Post-op Pain Management:    Induction: Intravenous  Airway Management Planned: LMA  Additional Equipment:   Intra-op Plan:   Post-operative Plan:   Informed Consent: I have reviewed the patients History and Physical, chart, labs and discussed the procedure including the risks, benefits and alternatives for the proposed anesthesia with the patient or authorized representative who has indicated his/her understanding and acceptance.   Dental Advisory Given  Plan Discussed with: CRNA and Surgeon  Anesthesia Plan Comments:         Anesthesia Quick Evaluation

## 2011-06-01 NOTE — Addendum Note (Signed)
Addendum  created 06/01/11 1506 by Joycie Peek, CRNA   Modules edited:Anesthesia Responsible Staff

## 2011-06-01 NOTE — Interval H&P Note (Signed)
History and Physical Interval Note:  06/01/2011 1:54 PM  Willie Gaines  has presented today for surgery, with the diagnosis of Left Ureteral Obstruction  The various methods of treatment have been discussed with the patient and family. After consideration of risks, benefits and other options for treatment, the patient has consented to  Procedure(s): CYSTOSCOPY WITH STENT REPLACEMENT as a surgical intervention .  The patients' history has been reviewed, patient examined, no change in status, stable for surgery.  I have reviewed the patients' chart and labs.  Questions were answered to the patient's satisfaction.     Khalfani Weideman,LES

## 2011-06-01 NOTE — Transfer of Care (Signed)
Immediate Anesthesia Transfer of Care Note  Patient: Willie Gaines  Procedure(s) Performed:  CYSTOSCOPY WITH STENT REPLACEMENT - left stent   Patient Location: PACU  Anesthesia Type: General  Level of Consciousness: alert  and oriented  Airway & Oxygen Therapy: Patient Spontanous Breathing and Patient connected to face mask oxygen  Post-op Assessment: Report given to PACU RN and Post -op Vital signs reviewed and stable  Post vital signs: Reviewed and stable  Complications: No apparent anesthesia complications

## 2011-06-19 ENCOUNTER — Encounter (HOSPITAL_COMMUNITY): Payer: Self-pay | Admitting: Urology

## 2011-07-10 ENCOUNTER — Other Ambulatory Visit: Payer: Self-pay | Admitting: Urology

## 2011-09-21 ENCOUNTER — Encounter (HOSPITAL_COMMUNITY): Payer: Self-pay | Admitting: Pharmacy Technician

## 2011-09-25 ENCOUNTER — Encounter: Payer: Medicare Other | Admitting: *Deleted

## 2011-09-26 ENCOUNTER — Other Ambulatory Visit (HOSPITAL_COMMUNITY): Payer: Self-pay | Admitting: Urology

## 2011-09-26 DIAGNOSIS — C61 Malignant neoplasm of prostate: Secondary | ICD-10-CM

## 2011-10-02 ENCOUNTER — Encounter (HOSPITAL_COMMUNITY)
Admission: RE | Admit: 2011-10-02 | Discharge: 2011-10-02 | Disposition: A | Discharge status: Home / Self Care | Payer: Medicare Other | Source: Ambulatory Visit | Attending: Urology | Admitting: Urology

## 2011-10-02 ENCOUNTER — Encounter (HOSPITAL_COMMUNITY): Payer: Self-pay

## 2011-10-02 LAB — BASIC METABOLIC PANEL
BUN: 15 mg/dL (ref 6–23)
CO2: 22 mEq/L (ref 19–32)
Chloride: 104 mEq/L (ref 96–112)
Creatinine, Ser: 1.39 mg/dL — ABNORMAL HIGH (ref 0.50–1.35)
Potassium: 4 mEq/L (ref 3.5–5.1)

## 2011-10-02 LAB — CBC
HCT: 37.6 % — ABNORMAL LOW (ref 39.0–52.0)
Hemoglobin: 12.2 g/dL — ABNORMAL LOW (ref 13.0–17.0)
MCV: 71.1 fL — ABNORMAL LOW (ref 78.0–100.0)
RBC: 5.29 MIL/uL (ref 4.22–5.81)
WBC: 5.3 10*3/uL (ref 4.0–10.5)

## 2011-10-02 NOTE — Patient Instructions (Signed)
YOUR SURGERY IS SCHEDULED ON:  Thursday  6/6  AT 3:00 PM  REPORT TO [facility] AT: 1:00 PM      PHONE # FOR SHORT STAY IS [phone]  DO NOT EAT  ANYTHING AFTER MIDNIGHT THE NIGHT BEFORE YOUR SURGERY.  NO FOOD, NO CHEWING GUM, NO MINTS, NO CANDIES.  YOU MAY HAVE CLEAR LIQUIDS TO DRINK FROM MIDNIGHT UNTIL  9:00 AM DAY OF YOUR SURGERY--SUCH AS WATER.  NOTHING TO DRINK AFTER 9:00 AM.  PLEASE TAKE THE FOLLOWING MEDICATIONS THE AM OF YOUR SURGERY WITH A FEW SIPS OF WATER:  NO MEDS TO TAKE    IF YOU USE INHALERS--USE YOUR INHALERS THE AM OF YOUR SURGERY AND BRING INHALERS TO THE HOSPITAL -TAKE TO SURGERY.    IF YOU ARE DIABETIC:  DO NOT TAKE ANY DIABETIC MEDICATIONS THE AM OF YOUR SURGERY.  IF YOU TAKE INSULIN IN THE EVENINGS--PLEASE ONLY TAKE 1/2 NORMAL EVENING DOSE THE NIGHT BEFORE YOUR SURGERY.  NO INSULIN THE AM OF YOUR SURGERY.  IF YOU HAVE SLEEP APNEA AND USE CPAP OR BIPAP--PLEASE BRING THE MASK --NOT THE MACHINE-NOT THE TUBING   -JUST THE MASK. DO NOT BRING VALUABLES, MONEY, CREDIT CARDS.  CONTACT LENS, DENTURES / PARTIALS, GLASSES SHOULD NOT BE WORN TO SURGERY AND IN MOST CASES-HEARING AIDS WILL NEED TO BE REMOVED.  BRING YOUR GLASSES CASE, ANY EQUIPMENT NEEDED FOR YOUR CONTACT LENS. FOR PATIENTS ADMITTED TO THE HOSPITAL--CHECK OUT TIME THE DAY OF DISCHARGE IS 11:00 AM.  ALL INPATIENT ROOMS ARE PRIVATE - WITH BATHROOM, TELEPHONE, TELEVISION AND WIFI INTERNET. IF YOU ARE BEING DISCHARGED THE SAME DAY OF YOUR SURGERY--YOU CAN NOT DRIVE YOURSELF HOME--AND SHOULD NOT GO HOME ALONE BY TAXI OR BUS.  NO DRIVING OR OPERATING MACHINERY FOR 24 HOURS FOLLOWING ANESTHESIA / PAIN MEDICATIONS.                            SPECIAL INSTRUCTIONS:  CHLORHEXIDINE SOAP SHOWER (other brand names are Betasept and Hibiclens ) PLEASE SHOWER WITH CHLORHEXIDINE THE NIGHT BEFORE YOUR SURGERY AND THE AM OF YOUR SURGERY. DO NOT USE CHLORHEXIDINE ON YOUR FACE OR PRIVATE AREAS--YOU MAY USE YOUR NORMAL  SOAP THOSE AREAS AND YOUR NORMAL SHAMPOO.  WOMEN SHOULD AVOID SHAVING UNDER ARMS AND SHAVING LEGS 48 HOURS BEFORE USING CHLORHEXIDINE TO AVOID SKIN IRRITATION.  DO NOT USE IF ALLERGIC TO CHLORHEXIDINE.  PLEASE READ OVER ANY  FACT SHEETS THAT YOU WERE GIVEN: MRSA INFORMATION

## 2011-10-02 NOTE — Pre-Procedure Instructions (Signed)
PT HAS EKG REPORT 02/14/11 FROM [facility] PRESURGRICAL TESTING-COPY ON PT'S CHART AND IN EPIC AND NOT REPEATED TODAY. CXR NOT NEEDED PER ANESTHESIOLOGIST'S GUIDELINES. CBC AND BMET WERE DONE TODAY AT [facility] PREOP.

## 2011-10-03 ENCOUNTER — Encounter (HOSPITAL_COMMUNITY)
Admission: RE | Admit: 2011-10-03 | Discharge: 2011-10-03 | Disposition: A | Discharge status: Home / Self Care | Payer: Medicare Other | Source: Ambulatory Visit | Attending: Urology | Admitting: Urology

## 2011-10-03 ENCOUNTER — Other Ambulatory Visit (HOSPITAL_COMMUNITY): Payer: Self-pay | Admitting: Urology

## 2011-10-03 DIAGNOSIS — C7951 Secondary malignant neoplasm of bone: Secondary | ICD-10-CM | POA: Insufficient documentation

## 2011-10-03 DIAGNOSIS — Z9079 Acquired absence of other genital organ(s): Secondary | ICD-10-CM | POA: Insufficient documentation

## 2011-10-03 DIAGNOSIS — C61 Malignant neoplasm of prostate: Secondary | ICD-10-CM

## 2011-10-03 MED ORDER — TECHNETIUM TC 99M MEDRONATE IV KIT
24.0000 | PACK | Freq: Once | INTRAVENOUS | Status: AC | PRN
  Administered 2011-10-03: 24 via INTRAVENOUS

## 2011-10-03 NOTE — H&P (Signed)
History of Present Illness  Willie Gaines is a 71 year old with the following urologic history:  1) Metastatic prostate cancer: He is s/p a non-nerve sparing RAL radical prostectomy on January 01, 2008. His postoperative course was complicated by a urine leak which resolved with catheter drainage. His pathology demonstrated pT3b N0 Mx (16 nodes negative), Gleason 4+3=7 (tertiary pattern 5) adenocarcinoma with positive surgical margins at the seminal vesicles. His PSA initially became undetectable after surgery and he proceeded with adjuvant radiation therapy which he completed in June 2010. He developed a biochemical recurrence in October 2010. 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 He began androgen deprivation with Lupron in August 2011. 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  2) Left ureteral obstruction: This was diagnosed in August 2011 and it has been managed with ureteral stent drainage. Last stent: 06/01/11 (6 x 24)  3) Testosterone deficiency/osteopenia: He takes Vitamin D and calcium supplementation for osteoporosis prevention. Current therapy: Lupron 30 mg (05/16/11) Last DXA: July 2011 (Osteopenia) Bone treatment: Xgeva 120 mg monthly  Interval history:  He follows up today for further surveillance and management of his metastatic prostate cancer and left ureteral obstruction. Overall, he remains asymptomatic without any new pain symptoms, weight loss, or urinary difficulties. He continues to have stable urinary frequency and urgency related to his stent although this is no worse than previously. He has had a gradually rising PSA although it did somewhat stabilize last fall.     Past  Medical History Problems  1. History of  Acute Pancreatitis 577.0 2. 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 Prostate Cancer 185  Surgical History Problems  1. History of  Appendectomy 2. History of  Cystoscopy With Insertion Of Ureteral Stent Left 3. History of  Cystoscopy With Insertion Of Ureteral Stent Left 4. History of  Cystoscopy With Insertion Of Ureteral Stent Left 5. History of  Cystoscopy With Insertion Of Ureteral Stent Left 6. History of  Cystoscopy With Insertion Of Ureteral Stent Left 7. History of  Ileostomy 8. History of  Ileostomy Closure 9. History of  Partial Colectomy 10. History of  Prostatect Retropubic Radical W/ Nerve Sparing Laparoscopic 11. History of  Rotator Cuff Repair Bilateral  Current Meds 1. Bromday 0.09 % Ophthalmic Solution; Therapy: 22Aug2012 to 2. Calcium Citrate 250 MG Oral Tablet; Therapy: (Recorded:17Apr2012) to 3. Crestor 10 MG Oral Tablet; Therapy: (Recorded:17Apr2012) to 4. EQ Aspirin Low Dose 81 MG TABS; Therapy: (Recorded:17Apr2012) to 5. Multiple Vitamin TABS; Therapy: (Recorded:28Sep2011) to 6. Prolia 60 MG/ML Subcutaneous Solution; INJECT SUBCUTANEOUSLY  60 MG / 1 ML EVERY 6  MONTHS; To Be Done: 09May2012; Status: HOLD FOR - Administration 7. Vitamin D TABS; Therapy: (Recorded:16Jan2013) to 8. Xgeva 120 MG/1.7ML Subcutaneous Solution; INJECT 120  MG Subcutaneous Monthly; To Be  Done: 14Nov2012; Status: HOLD FOR - Administration  Allergies Medication  1. No Known Drug Allergies  Family History Problems  1. Paternal history of  Adenocarcinoma Of The Pancreas 2. Maternal history of  Asthma V17.5 3. Fraternal history of  Prostate Cancer V16.42  Social History Problems  1. Alcohol Use 2. Former Smoker V15.82 quit smoking in 1981 3. Marital History - Currently Married 4. Occupation: retired Nurse, children's  5. History of  Tobacco Use  Vitals Vital Signs [Data Includes: Last 1 Day]  22May2013  01:53PM  BMI Calculated: 31.11 BSA Calculated: 2.11 Height: 5 ft 9 in Weight: 210 lb  Blood Pressure: 147 / 69 Heart Rate: 76  Physical Exam Constitutional: Well nourished and well developed . No acute distress.  ENT:. The ears and nose are normal in appearance.  Neck: The appearance of the neck is normal and no neck mass is present.  Pulmonary: No respiratory distress and normal respiratory rhythm and effort.  Abdomen: The abdomen is soft and nontender.  Neuro/Psych:. Mood and affect are appropriate.    Results/Data Urine [Data Includes: Last 1 Day]   22May2013  COLOR YELLOW   APPEARANCE CLEAR   SPECIFIC GRAVITY 1.025   pH 5.0   GLUCOSE NEG mg/dL  BILIRUBIN NEG   KETONE NEG mg/dL  BLOOD LARGE   PROTEIN TRACE mg/dL  UROBILINOGEN 0.2 mg/dL  NITRITE NEG   LEUKOCYTE ESTERASE SMALL   SQUAMOUS EPITHELIAL/HPF RARE   WBC 0-3 WBC/hpf  RBC 11-20 RBC/hpf  BACTERIA NONE SEEN   CRYSTALS NONE SEEN   CASTS NONE SEEN   Selected Results  TESTOSTERONE 15May2013 09:44AM Heloise Purpura  SPECIMEN TYPE: BLOOD   Test Name Result Flag Reference  TESTOSTERONE, TOTAL <10.00 ng/dL L 161-096  Result repeated and verified.  Please note change in reference range(s).              Tanner Stage       Male              Male               I              < 30 ng/dL        < 10 ng/dL               II             < 150 ng/dL       < 30 ng/dL               III            100-320 ng/dL     < 35 ng/dL               IV             200-970 ng/dL     04-54 ng/dL               V              250-890 ng/dL     09-81 ng/dL   PSA [id number] 62:13YQ Heloise Purpura  SPECIMEN TYPE: BLOOD   Test Name Result Flag Reference  PSA 7.35 ng/mL H <=4.00  Test Methodology: ECLIA PSA (Electrochemiluminescence Immunoassay)   For PSA values from 2.5-4.0, particularly in younger men <69 years old, the AUA and NCCN suggest testing for % Free PSA (3515) and evaluation of the rate of increase in PSA (PSA velocity).    BASIC METABOLIC PANEL 20Mar2013 10:58AM Heloise Purpura  SPECIMEN TYPE: BLOOD   Test Name Result Flag Reference  GLUCOSE 112 mg/dL H 65-78  BUN 17 mg/dL  4-69  CREATININE 6.29 mg/dL H 5.28-4.13  SODIUM 244 mEq/L  135-145  POTASSIUM 4.9 mEq/L  3.5-5.3  CHLORIDE 104 mEq/L  96-112  CO2 18 mEq/L L 19-32  CALCIUM 9.3 mg/dL  0.1-02.7  Est GFR, African American  52 mL/min L   Est GFR, NonAfrican American 45 mL/min L   The estimated GFR is a calculation valid for adults (32 to 73 years old) that uses the CKD-EPI algorithm to adjust for age and sex. It is not to be used for children, pregnant women, hospitalized patients, patients on dialysis, or with rapidly changing kidney function. According to the NKDEP, eGFR >89 is normal, 60-89 shows mild impairment, 30-59 shows moderate impairment, 15-29 shows severe impairment and <15 is ESRD.   PSA Flowsheet Tempie Donning Includes: Last 1 Year]   15May2013 20Mar2013 27Dec2012 11Oct2012 06Sep2012 16Aug2012  PSA 7.35 ng/mL 5.07 ng/mL 3.22 ng/mL 3.77 ng/mL 3.40 ng/mL 3.98 ng/mL  Free PSA        % Free PSA        Assessment Assessed  1. Prostate Cancer 185 2. 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 Hypogonadism 257.2 4. Ureteral Obstruction 593.4  Plan Health Maintenance (V70.0)  1. UA With REFLEX  Done: 22May2013 01:47PM Prostate Cancer (185)  2. Lupron Depot 30 MG Intramuscular Kit; INJECT 30 MG Intramuscular; Done: 22May2013  02:56PM; Status: COMPLETE 3. PSA  Requested for: 20Sep2013 4. TESTOSTERONE  Requested for: 20Sep2013 5. Follow-up Month x 4 Office  Follow-up  Requested for: 27Sep2013 Taking Medication For A Long Time (V58.69)  6. DEXA-Spine/Hip  Requested for: 27Sep2013  Discussion/Summary 1. Metastatic castrate resistant prostate cancer: We reviewed his PSA which continues to increase consistent with his diagnosis of castrate resistant disease. He will receive Lupron 30 mg today. We discussed options at this time for  additional treatment specifically focused on the option of androgen receptor blockade and sipeleucel T. He is a candidate for the STRIVE trial comparing bicalutamide and enzalutimide. We have previously discussed this trial and discuss this further today. He is interested in proceeding and will be put in contact with her research staff today to consider entry. We also discussed possibly proceeding with sipeleucel T later this summer or early fall pending his response to therapy and is coming staging studies.  At the very least, he will followup in 4 months for his next Lupron which will be scheduled today. If he does not enter STRIVE, I will have him consider starting bicalutamide and get another PSA in 2 months.  2. Left ureteral obstruction: He is scheduled for cystoscopy and left ureteral stent change. We have reviewed the potential risks, complications, and expected recovery from this procedure.  3. Testosterone deficiency/osteopenia/bone metastases: He will continue monthly therapy with Xgeva. His recent calcium level in March was normal. He will be due for a repeat bone density evaluation at his next visit in 4 months.  CC: Dr. Margaretmary Dys Dr. Elias Else   A total of 35 minutes were spent in the overall care of the patient today with 25 minutes in direct face to face consultation.    Signatures Electronically signed by : Heloise Purpura, [name]; Sep 19 2011  5:10PM

## 2011-10-04 ENCOUNTER — Encounter (HOSPITAL_COMMUNITY): Payer: Self-pay | Admitting: Anesthesiology

## 2011-10-04 ENCOUNTER — Ambulatory Visit (HOSPITAL_COMMUNITY)
Admission: RE | Admit: 2011-10-04 | Discharge: 2011-10-04 | Disposition: A | Discharge status: Home / Self Care | Payer: Medicare Other | Source: Ambulatory Visit | Attending: Urology | Admitting: Urology

## 2011-10-04 ENCOUNTER — Encounter (HOSPITAL_COMMUNITY): Payer: Self-pay | Admitting: *Deleted

## 2011-10-04 ENCOUNTER — Encounter (HOSPITAL_COMMUNITY)
Admission: RE | Disposition: A | Discharge status: Home / Self Care | Payer: Self-pay | Source: Ambulatory Visit | Attending: Urology

## 2011-10-04 ENCOUNTER — Ambulatory Visit (HOSPITAL_COMMUNITY): Payer: Medicare Other | Admitting: Anesthesiology

## 2011-10-04 DIAGNOSIS — N135 Crossing vessel and stricture of ureter without hydronephrosis: Secondary | ICD-10-CM | POA: Insufficient documentation

## 2011-10-04 DIAGNOSIS — C61 Malignant neoplasm of prostate: Secondary | ICD-10-CM | POA: Insufficient documentation

## 2011-10-04 DIAGNOSIS — Z01812 Encounter for preprocedural laboratory examination: Secondary | ICD-10-CM | POA: Insufficient documentation

## 2011-10-04 DIAGNOSIS — Z9079 Acquired absence of other genital organ(s): Secondary | ICD-10-CM | POA: Insufficient documentation

## 2011-10-04 DIAGNOSIS — Z79899 Other long term (current) drug therapy: Secondary | ICD-10-CM | POA: Insufficient documentation

## 2011-10-04 DIAGNOSIS — E291 Testicular hypofunction: Secondary | ICD-10-CM | POA: Insufficient documentation

## 2011-10-04 DIAGNOSIS — C7951 Secondary malignant neoplasm of bone: Secondary | ICD-10-CM | POA: Insufficient documentation

## 2011-10-04 HISTORY — PX: CYSTOSCOPY W/ URETERAL STENT PLACEMENT: SHX1429

## 2011-10-04 SURGERY — CYSTOSCOPY, FLEXIBLE, WITH STENT REPLACEMENT
Anesthesia: General | Laterality: Left | Wound class: Clean Contaminated

## 2011-10-04 MED ORDER — LACTATED RINGERS IV SOLN
INTRAVENOUS | Status: DC | PRN
  Administered 2011-10-04: 14:00:00 via INTRAVENOUS

## 2011-10-04 MED ORDER — STERILE WATER FOR IRRIGATION IR SOLN
Status: DC | PRN
  Administered 2011-10-04: 6000 mL

## 2011-10-04 MED ORDER — HYDROCODONE-ACETAMINOPHEN 5-300 MG PO TABS: 1.0000 | ORAL_TABLET | Freq: Four times a day (QID) | ORAL | Status: DC

## 2011-10-04 MED ORDER — LIDOCAINE HCL 1 % IJ SOLN
INTRAMUSCULAR | Status: DC | PRN
  Administered 2011-10-04: 60 mg via INTRADERMAL

## 2011-10-04 MED ORDER — FENTANYL CITRATE 0.05 MG/ML IJ SOLN
INTRAMUSCULAR | Status: DC | PRN
  Administered 2011-10-04 (×2): 50 ug via INTRAVENOUS

## 2011-10-04 MED ORDER — FENTANYL CITRATE 0.05 MG/ML IJ SOLN: 25.0000 ug | INTRAMUSCULAR | Status: DC | PRN

## 2011-10-04 MED ORDER — PROPOFOL 10 MG/ML IV EMUL
INTRAVENOUS | Status: DC | PRN
  Administered 2011-10-04: 200 mg via INTRAVENOUS

## 2011-10-04 MED ORDER — PROMETHAZINE HCL 25 MG/ML IJ SOLN: 6.2500 mg | INTRAMUSCULAR | Status: DC | PRN

## 2011-10-04 MED ORDER — KETOROLAC TROMETHAMINE 30 MG/ML IJ SOLN: 15.0000 mg | Freq: Once | INTRAMUSCULAR | Status: DC | PRN

## 2011-10-04 MED ORDER — CIPROFLOXACIN IN D5W 400 MG/200ML IV SOLN
INTRAVENOUS | Status: AC
  Filled 2011-10-04: qty 200

## 2011-10-04 MED ORDER — EPHEDRINE SULFATE 50 MG/ML IJ SOLN
INTRAMUSCULAR | Status: DC | PRN
  Administered 2011-10-04: 5 mg via INTRAVENOUS

## 2011-10-04 MED ORDER — CIPROFLOXACIN IN D5W 400 MG/200ML IV SOLN
400.0000 mg | INTRAVENOUS | Status: AC
  Administered 2011-10-04: 400 mg via INTRAVENOUS

## 2011-10-04 MED ORDER — ONDANSETRON HCL 4 MG/2ML IJ SOLN
INTRAMUSCULAR | Status: DC | PRN
  Administered 2011-10-04: 4 mg via INTRAVENOUS

## 2011-10-04 MED ORDER — IOHEXOL 300 MG/ML  SOLN
INTRAMUSCULAR | Status: AC
  Filled 2011-10-04: qty 1

## 2011-10-04 SURGICAL SUPPLY — 14 items
ADAPTER CATH URET PLST 4-6FR (CATHETERS) ×2 IMPLANT
BAG URO CATCHER STRL LF (DRAPE) ×2 IMPLANT
BASKET ZERO TIP NITINOL 2.4FR (BASKET) IMPLANT
CATH INTERMIT  6FR 70CM (CATHETERS) IMPLANT
CLOTH BEACON ORANGE TIMEOUT ST (SAFETY) ×2 IMPLANT
DRAPE CAMERA CLOSED 9X96 (DRAPES) ×2 IMPLANT
GLOVE BIOGEL M STRL SZ7.5 (GLOVE) ×2 IMPLANT
GOWN STRL NON-REIN LRG LVL3 (GOWN DISPOSABLE) ×4 IMPLANT
GUIDEWIRE ANG ZIPWIRE 038X150 (WIRE) IMPLANT
GUIDEWIRE STR DUAL SENSOR (WIRE) ×2 IMPLANT
MANIFOLD NEPTUNE II (INSTRUMENTS) ×2 IMPLANT
PACK CYSTO (CUSTOM PROCEDURE TRAY) ×2 IMPLANT
STENT CONTOUR 6FRX24X.038 (STENTS) ×2 IMPLANT
TUBING CONNECTING 10 (TUBING) ×2 IMPLANT

## 2011-10-04 NOTE — Interval H&P Note (Signed)
History and Physical Interval Note:  10/04/2011 1:45 PM  Willie Gaines  has presented today for surgery, with the diagnosis of LEFT URETERAL OBSTRUCTION  The various methods of treatment have been discussed with the patient and family. After consideration of risks, benefits and other options for treatment, the patient has consented to  Procedure(s) (LRB): CYSTOSCOPY WITH STENT REPLACEMENT (Left) as a surgical intervention .  The patients' history has been reviewed, patient examined, no change in status, stable for surgery.  I have reviewed the patients' chart and labs.  Questions were answered to the patient's satisfaction.     Anicka Stuckert,LES

## 2011-10-04 NOTE — Discharge Instructions (Signed)

## 2011-10-04 NOTE — Transfer of Care (Signed)
Immediate Anesthesia Transfer of Care Note  Patient: Willie Gaines  Procedure(s) Performed: Procedure(s) (LRB): CYSTOSCOPY WITH STENT REPLACEMENT (Left)  Patient Location: PACU  Anesthesia Type: General  Level of Consciousness: awake, oriented and patient cooperative  Airway & Oxygen Therapy: Patient Spontanous Breathing and Patient connected to face mask oxygen  Post-op Assessment: Report given to PACU RN, Post -op Vital signs reviewed and stable and Patient moving all extremities  Post vital signs: Reviewed and stable  Complications: No apparent anesthesia complications

## 2011-10-04 NOTE — Preoperative (Signed)
Beta Blockers   Reason not to administer Beta Blockers:Not Applicable 

## 2011-10-04 NOTE — Anesthesia Preprocedure Evaluation (Addendum)
Anesthesia Evaluation  Patient identified by MRN, date of birth, ID band Patient awake  General Assessment Comment:Metastatic prostate ca  Reviewed: Allergy & Precautions, H&P , NPO status , Patient's Chart, lab work & pertinent test results  Airway Mallampati: II TM Distance: >3 FB Neck ROM: Full    Dental No notable dental hx.    Pulmonary neg pulmonary ROS,  breath sounds clear to auscultation  Pulmonary exam normal       Cardiovascular negative cardio ROS  Rhythm:Regular Rate:Normal     Neuro/Psych negative neurological ROS  negative psych ROS   GI/Hepatic negative GI ROS, Neg liver ROS,   Endo/Other  negative endocrine ROS  Renal/GU negative Renal ROS  negative genitourinary   Musculoskeletal negative musculoskeletal ROS (+)   Abdominal   Peds negative pediatric ROS (+)  Hematology negative hematology ROS (+)   Anesthesia Other Findings   Reproductive/Obstetrics negative OB ROS                           Anesthesia Physical Anesthesia Plan  ASA: II  Anesthesia Plan: General   Post-op Pain Management:    Induction: Intravenous  Airway Management Planned: LMA  Additional Equipment:   Intra-op Plan:   Post-operative Plan:   Informed Consent: I have reviewed the patients History and Physical, chart, labs and discussed the procedure including the risks, benefits and alternatives for the proposed anesthesia with the patient or authorized representative who has indicated his/her understanding and acceptance.   Dental advisory given  Plan Discussed with: CRNA  Anesthesia Plan Comments:         Anesthesia Quick Evaluation

## 2011-10-04 NOTE — Op Note (Signed)
Preoperative diagnosis:  1. Left ureteral obstruction   Postoperative diagnosis:  1. Left ureteral obstruction   Procedure:  1. Cystoscopy 2. Left ureteral stent placement (6 x 24)  Surgeon: Rolly Salter, Montez Hageman. [name]  Anesthesia: General  Complications: None  EBL: Minimal  Specimens: None  Indication: Willie Gaines is a 71 y.o. patient with ureteral obstruction. After reviewing the management options for treatment, he elected to proceed with the above surgical procedure(s). We have discussed the potential benefits and risks of the procedure, side effects of the proposed treatment, the likelihood of the patient achieving the goals of the procedure, and any potential problems that might occur during the procedure or recuperation. Informed consent has been obtained.  Description of procedure:  The patient was taken to the operating room and general anesthesia was induced.  The patient was placed in the dorsal lithotomy position, prepped and draped in the usual sterile fashion, and preoperative antibiotics were administered. A preoperative time-out was performed.   Cystourethroscopy was performed.  The patient's urethra was examined and was normal. The bladder was then systematically examined in its entirety. There was no evidence for any bladder tumors, stones, or other mucosal pathology.    Attention then turned to the left ureteral orifice and the patient's indwelling ureteral stent was identified and brought out to the urethral meatus with the flexible graspers.  A 0.38 sensor guidewire was then advanced up the left ureter into the renal pelvis under fluoroscopic guidance.  The wire was then backloaded through the cystoscope and a ureteral stent was advance over the wire using Seldinger technique.  The stent was positioned appropriately under fluoroscopic and cystoscopic guidance.  The wire was then removed with an adequate stent curl noted in the renal pelvis as well as in the  bladder.  The bladder was then emptied and the procedure ended.  The patient appeared to tolerate the procedure well and without complications.  The patient was able to be awakened and transferred to the recovery unit in satisfactory condition.    Willie Bruins MD

## 2011-10-04 NOTE — Anesthesia Postprocedure Evaluation (Signed)
  Anesthesia Post-op Note  Patient: Willie Gaines  Procedure(s) Performed: Procedure(s) (LRB): CYSTOSCOPY WITH STENT REPLACEMENT (Left)  Patient Location: PACU  Anesthesia Type: General  Level of Consciousness: awake and alert   Airway and Oxygen Therapy: Patient Spontanous Breathing  Post-op Pain: mild  Post-op Assessment: Post-op Vital signs reviewed, Patient's Cardiovascular Status Stable, Respiratory Function Stable, Patent Airway and No signs of Nausea or vomiting  Post-op Vital Signs: stable  Complications: No apparent anesthesia complications

## 2011-10-05 ENCOUNTER — Encounter (HOSPITAL_COMMUNITY): Payer: Self-pay | Admitting: Urology

## 2011-10-16 ENCOUNTER — Encounter: Payer: Self-pay | Admitting: Cardiology

## 2011-10-23 ENCOUNTER — Other Ambulatory Visit (HOSPITAL_COMMUNITY): Payer: Self-pay | Admitting: Urology

## 2011-10-23 DIAGNOSIS — C61 Malignant neoplasm of prostate: Secondary | ICD-10-CM

## 2012-01-03 ENCOUNTER — Encounter (HOSPITAL_COMMUNITY)
Admission: RE | Admit: 2012-01-03 | Discharge: 2012-01-03 | Disposition: A | Discharge status: Home / Self Care | Payer: 59 | Source: Ambulatory Visit | Attending: Urology | Admitting: Urology

## 2012-01-03 DIAGNOSIS — C7951 Secondary malignant neoplasm of bone: Secondary | ICD-10-CM | POA: Insufficient documentation

## 2012-01-03 DIAGNOSIS — C61 Malignant neoplasm of prostate: Secondary | ICD-10-CM | POA: Insufficient documentation

## 2012-01-03 MED ORDER — TECHNETIUM TC 99M MEDRONATE IV KIT
25.0000 | PACK | Freq: Once | INTRAVENOUS | Status: AC | PRN
  Administered 2012-01-03: 25 via INTRAVENOUS

## 2012-01-09 ENCOUNTER — Other Ambulatory Visit (HOSPITAL_COMMUNITY): Payer: Self-pay | Admitting: Urology

## 2012-01-09 DIAGNOSIS — C7951 Secondary malignant neoplasm of bone: Secondary | ICD-10-CM

## 2012-01-10 ENCOUNTER — Telehealth: Payer: Self-pay | Admitting: Oncology

## 2012-01-10 NOTE — Telephone Encounter (Signed)
S/W pt in re NP appt 9/18 @ 10:30 w/ Dr. Clelia Croft.  Referring Dr. Heloise Purpura Dx- Prostate Ca NP packet mail.

## 2012-01-10 NOTE — Telephone Encounter (Signed)
C/D on 9/12 for appt 9/18

## 2012-01-11 ENCOUNTER — Other Ambulatory Visit: Payer: Self-pay | Admitting: Oncology

## 2012-01-11 ENCOUNTER — Ambulatory Visit (HOSPITAL_COMMUNITY)
Admission: RE | Admit: 2012-01-11 | Discharge: 2012-01-11 | Disposition: A | Discharge status: Home / Self Care | Payer: 59 | Source: Ambulatory Visit | Attending: Urology | Admitting: Urology

## 2012-01-11 DIAGNOSIS — M25559 Pain in unspecified hip: Secondary | ICD-10-CM | POA: Insufficient documentation

## 2012-01-11 DIAGNOSIS — M79609 Pain in unspecified limb: Secondary | ICD-10-CM | POA: Insufficient documentation

## 2012-01-11 DIAGNOSIS — C7952 Secondary malignant neoplasm of bone marrow: Secondary | ICD-10-CM | POA: Insufficient documentation

## 2012-01-11 DIAGNOSIS — C61 Malignant neoplasm of prostate: Secondary | ICD-10-CM

## 2012-01-11 DIAGNOSIS — N269 Renal sclerosis, unspecified: Secondary | ICD-10-CM | POA: Insufficient documentation

## 2012-01-11 DIAGNOSIS — C7951 Secondary malignant neoplasm of bone: Secondary | ICD-10-CM | POA: Insufficient documentation

## 2012-01-11 MED ORDER — GADOBENATE DIMEGLUMINE 529 MG/ML IV SOLN
19.0000 mL | Freq: Once | INTRAVENOUS | Status: AC | PRN
  Administered 2012-01-11: 19 mL via INTRAVENOUS

## 2012-01-14 ENCOUNTER — Other Ambulatory Visit: Payer: Self-pay | Admitting: Urology

## 2012-01-16 ENCOUNTER — Other Ambulatory Visit: Payer: Self-pay | Admitting: Lab

## 2012-01-16 ENCOUNTER — Ambulatory Visit (HOSPITAL_BASED_OUTPATIENT_CLINIC_OR_DEPARTMENT_OTHER): Payer: 59 | Admitting: Lab

## 2012-01-16 ENCOUNTER — Telehealth: Payer: Self-pay | Admitting: Oncology

## 2012-01-16 ENCOUNTER — Ambulatory Visit: Payer: Self-pay

## 2012-01-16 ENCOUNTER — Encounter: Payer: Self-pay | Admitting: Oncology

## 2012-01-16 ENCOUNTER — Ambulatory Visit (HOSPITAL_BASED_OUTPATIENT_CLINIC_OR_DEPARTMENT_OTHER): Payer: 59 | Admitting: Oncology

## 2012-01-16 VITALS — BP 136/85 | HR 92 | Temp 98.4°F | Wt 206.4 lb

## 2012-01-16 DIAGNOSIS — C61 Malignant neoplasm of prostate: Secondary | ICD-10-CM

## 2012-01-16 DIAGNOSIS — C7951 Secondary malignant neoplasm of bone: Secondary | ICD-10-CM

## 2012-01-16 DIAGNOSIS — M549 Dorsalgia, unspecified: Secondary | ICD-10-CM

## 2012-01-16 LAB — CBC WITH DIFFERENTIAL/PLATELET
Eosinophils Absolute: 0.2 10*3/uL (ref 0.0–0.5)
HCT: 41.1 % (ref 38.4–49.9)
HGB: 13.2 g/dL (ref 13.0–17.1)
LYMPH%: 27.9 % (ref 14.0–49.0)
MONO#: 0.6 10*3/uL (ref 0.1–0.9)
NEUT#: 4.2 10*3/uL (ref 1.5–6.5)
NEUT%: 60 % (ref 39.0–75.0)
Platelets: 323 10*3/uL (ref 140–400)
WBC: 7 10*3/uL (ref 4.0–10.3)

## 2012-01-16 LAB — COMPREHENSIVE METABOLIC PANEL (CC13)
CO2: 23 mEq/L (ref 22–29)
Calcium: 9.6 mg/dL (ref 8.4–10.4)
Chloride: 102 mEq/L (ref 98–107)
Creatinine: 1.5 mg/dL — ABNORMAL HIGH (ref 0.7–1.3)
Glucose: 110 mg/dl — ABNORMAL HIGH (ref 70–99)
Sodium: 137 mEq/L (ref 136–145)
Total Bilirubin: 0.4 mg/dL (ref 0.20–1.20)
Total Protein: 7.5 g/dL (ref 6.4–8.3)

## 2012-01-16 MED ORDER — HYDROCODONE-ACETAMINOPHEN 5-300 MG PO TABS: 1.0000 | ORAL_TABLET | Freq: Four times a day (QID) | ORAL | Status: DC

## 2012-01-16 MED ORDER — ABIRATERONE ACETATE 250 MG PO TABS: 1000.0000 mg | ORAL_TABLET | Freq: Every day | ORAL | Status: DC

## 2012-01-16 NOTE — Progress Notes (Signed)
Note dictated

## 2012-01-16 NOTE — Telephone Encounter (Signed)
gv pt appt schedule for October.  °

## 2012-01-16 NOTE — Progress Notes (Signed)
Called [facility], [id number], zytiga 250mg  has been approved from 12/26/11-01/15/13.

## 2012-01-16 NOTE — Progress Notes (Signed)
CC:   Willie Gaines, [name] Willie Purpura, Willie Gaines  REASON FOR CONSULTATION:  Prostate cancer.  HISTORY OF PRESENT ILLNESS:  This is a pleasant 71 year old gentleman, native of, [place], [place], moved to [place] in 1980.  He is a gentleman who worked at [employer] and currently retired.  Very healthy gentleman with past medical history significant for hyperlipidemia.  He also has a history of prostate cancer that dates back to 2009.  At that time he had presented with elevated PSA of about 9.5 and a had Gleason score of 4+3=7.  He underwent a radical prostatectomy on January 01, 2008, and the pathology from that, case number [id number], by Dr. Laverle Patter showed a prostatic adenocarcinoma, Gleason score of 4+3=7, involves both lobes.  Tumor extends into the extracapsular tissue, involvement of the seminal vesicle.  Lymph nodes were negative, and his pathological staging of T3b N0.  The patient did very well, did received adjuvant radiation therapy due to positive margins, and PSA was undetectable post surgery.  The patient developed biochemical relapse in October of 2010 and did not follow up due to multiple abdominal surgeries due to tubulovillous adenoma of the colon.  His PSA at that time had increased in July of 2011 and presented with metastatic disease and ureteral obstruction in August of 2011.  The patient started with androgen deprivation at that time as well had a stent placement.  The patient did well initially with hormonal deprivation up until March 2013, where he developed castration-resistant disease, again with a bone scan showed metastatic bony involvement.  The patient was enrolled in the STRIVE trial, a randomized trial for enzalutamide versus bicalutamide and after 90 days of therapy, his PSA was up to 54 and a repeat bone scan on January 03, 2012, showed a progression of disease with increase widespread skeletal metastasis.  There are new lesions in the  posterior and anterior ribs and increased intensity previously described.  He has also developed back pain and had an MRI of the lumbar spine done on 01/11/2012 that showed osseous metastasis involving the lumbar spine, most prominent at L3, with some anterior epidural tumor extension at that time.  The patient was prescribed hydrocodone and felt very well after taking it and really has very little back pain at this point.  He was referred to me for evaluation for further therapy.  Clinically, as mentioned, he does report some lower back pain, at times shooting pain down the leg, but that has subsided.  He does not any genitourinary complaints.  He is very active, has an excellent performance status, does not report any deterioration in his health.  REVIEW OF SYSTEMS:  He does not report any headaches, blurry vision, double vision.  Does not report any motor or sensory neuropathy.  He does not report any alteration in mental status.  He does not report any psychiatric issues, depression.  He does not report any fever, chills, sweats.  No cough, hemoptysis, hematemesis.  No nausea or vomiting, abdominal pain, hematochezia, melena, genitourinary complaints.  The rest of the review of systems unremarkable.  PAST MEDICAL HISTORY:  Significant for prostate cancer, hyperlipidemia, history of pancreatitis as well as tubulovillous adenoma and status post appendectomy, ileostomy, partial colectomy.  MEDICATIONS:  He is on Crestor, aspirin, multivitamin.  He is on [place] and Lupron.  ALLERGIES:  None.  SOCIAL HISTORY:  He is married.  He has 8 children.  Denied any alcohol or tobacco abuse.  Currently retired.  FAMILY HISTORY:  Four out of his 5 brothers have prostate cancer.  PHYSICAL EXAMINATION:  Alert, awake gentleman, appeared in no active distress.  His blood pressure is 136/85, pulse 92, respiration 20, weighs 206 pounds.  ECOG performance status is 0.  HEENT:  Head  is normocephalic, atraumatic.  Pupils are equal and round, reactive to light.  Oral mucosa moist and pink.  Neck:  Supple without lymphadenopathy.  Heart:  Regular rate and rhythm, S1 and S2.  Lungs: Clear to auscultation.  No rhonchi, wheeze or dullness to percussion. Abdomen:  Soft, nontender.  No hepatosplenomegaly.  Extremities:  No edema.  Neurologic:  Intact motor, sensory and deep tendon reflexes.  ASSESSMENT AND PLAN:  This is a 71 year old gentleman with the following issues: 1. Castration-resistant prostate cancer.  He has metastatic disease to     the bone.  I discussed today in detail the natural course of his     prostate cancer, most specifically advanced prostate cancer that is     castrate resistant.  He definitely has a rise in his prostate-     specific antigen with a rapid doubling time as well as a     progression on his bone scan.  The treatment options were discussed     today in detail with Willie Gaines that includes second-line hormone     manipulation with ketoconazole and prednisone, also an option with     systemic chemotherapy, a hormonal agent such as Zytiga, as well as     immune therapy such as Provenge.  Risks and benefits of all these     treatments were discussed today in detail, and I felt that he might     not be a good candidate for Provenge given the rapid advancement of     his cancer, really needs treatment rather quickly.  The benefit of     ketoconazole is marginal, so really limited to either Zytiga or     systemic chemotherapy, and he favors Zytiga at this point.  The     risks and benefits and toxicity associated with Zytiga were     discussed today.  Complications include GI toxicity, fluid     retention, electrolyte imbalance, hepatic toxicity, drug-drug     interactions were discussed.  I will give him Zytiga without     prednisone at this time.  Anticipate to start that after October     2nd in compliance with his clinical trial parameters.   I will see     him after being on this treatment for a few weeks to discuss     toxicity and remeasure his PSA. 2. Back pain and L3 involvement of his cancer.  Again, this case was     discussed in the tumor board, and I think he will probably benefit     from radiation therapy at some point.  He is minimally symptomatic     from this, but I fear that this might be a problem down the line.     He is known to Dr. Kathrynn Running.  I will have a low threshold to send     him to Dr. Kathrynn Running for radiation therapy. 3. Hormonal deprivation.  This will continue with the Lupron. 4. Bone-directed therapy.  I have recommended continued Xgeva at this     time.  He is receiving both Lupron and Xgeva at Dr. Vevelyn Royals     office, and I told him that I will be happy to continue those  here     if he would like, but for the time being it does not really matter     where he gets it as long as he is receiving both post treatments. 5. Pain, very minimal at this point.  I have given a prescription for     hydrocodone in case he runs. All his questions were answered today.    ______________________________ Willie Gaines, [name] FNS/MEDQ  D:  01/16/2012  T:  01/16/2012  Job:  [id number]

## 2012-01-17 ENCOUNTER — Encounter: Payer: Self-pay | Admitting: Oncology

## 2012-01-17 NOTE — Progress Notes (Signed)
Called Mr. Orrego about his $2000 zytiga copay.  Asked him to return my call because Imay be able to get him assistance through [employer].

## 2012-01-21 ENCOUNTER — Encounter (HOSPITAL_COMMUNITY): Payer: Self-pay | Admitting: Pharmacy Technician

## 2012-01-23 ENCOUNTER — Encounter: Payer: Self-pay | Admitting: Oncology

## 2012-01-23 NOTE — Progress Notes (Signed)
Patient is approved for zytiga copay assistance through [place] [facility] [place] from 01/22/12-01/21/13; informed [facility] of his approval dates and information:  ID # [id number] Group # [id number] [facility] # [id number]

## 2012-01-24 ENCOUNTER — Encounter (HOSPITAL_COMMUNITY): Payer: Self-pay

## 2012-01-24 ENCOUNTER — Telehealth: Payer: Self-pay | Admitting: *Deleted

## 2012-01-24 ENCOUNTER — Encounter: Payer: Self-pay | Admitting: Cardiology

## 2012-01-24 ENCOUNTER — Encounter (HOSPITAL_COMMUNITY)
Admission: RE | Admit: 2012-01-24 | Discharge: 2012-01-24 | Disposition: A | Discharge status: Home / Self Care | Payer: Medicare Other | Source: Ambulatory Visit | Attending: Urology | Admitting: Urology

## 2012-01-24 LAB — CBC
Hemoglobin: 13.4 g/dL (ref 13.0–17.0)
MCHC: 32.3 g/dL (ref 30.0–36.0)
Platelets: 321 10*3/uL (ref 150–400)

## 2012-01-24 LAB — BASIC METABOLIC PANEL
Calcium: 9.2 mg/dL (ref 8.4–10.5)
GFR calc Af Amer: 61 mL/min — ABNORMAL LOW (ref >90)
GFR calc non Af Amer: 53 mL/min — ABNORMAL LOW (ref >90)
Glucose, Bld: 113 mg/dL — ABNORMAL HIGH (ref 70–99)
Potassium: 4.5 mEq/L (ref 3.5–5.1)
Sodium: 133 mEq/L — ABNORMAL LOW (ref 135–145)

## 2012-01-24 LAB — SURGICAL PCR SCREEN
MRSA, PCR: NEGATIVE
Staphylococcus aureus: NEGATIVE

## 2012-01-24 NOTE — Patient Instructions (Addendum)
20 DAVYON BROSZ  01/24/2012   Your procedure is scheduled on: 01/28/12 at  4:00 pm   Report to [facility]  AT:  1:30P M.  Call this number if you have problems the morning of surgery: [phone]   Remember:   Do not eat food or drink liquids AFTER MIDNIGHT  May have clear liquids UNTIL 6 HOURS BEFORE SURGERY (10:00 AM)  Clear liquids include soda, tea, black coffee, apple or grape juice, broth.  Take these medicines the morning of surgery with A SIP OF WATER: ZYTIGA / HYDROCODONDE   Do not wear jewelry, make-up   Do not wear lotions, powders, or perfumes.   Do not shave legs or underarms 12 hrs. before surgery (men may shave face)  Do not bring valuables to the hospital.  Contacts, dentures or bridgework may not be worn into surgery.  Leave suitcase in the car. After surgery it may be brought to your room.  For patients admitted to the hospital more than one night, checkout time is 11:00 AM the day of discharge.   Patients discharged the day of surgery will not be allowed to drive home  If going home same day of surgery, must have someone stay with you first  24 hrs at home and arrange for some one to drive you home from hospital.    Special Instructions:   Please read over the following fact sheets that you were given:               1. MRSA  INFORMATION                      2. [place] PREPARING FOR SURGERY SHEET                                     X_______________________________________________________________

## 2012-01-24 NOTE — Telephone Encounter (Signed)
Patient called with concerns of when to start taking Zytiga.  Per Dr. Clelia Croft, patient to take after October 2nd in compliance with clinical trial.  Advised patient via voicemail to begin taking as soon as October 3rd.

## 2012-01-25 NOTE — Progress Notes (Signed)
This encounter was created in error - please disregard.

## 2012-01-26 NOTE — H&P (Signed)
History of Present Illness  Willie Gaines is a 71 year old with the following urologic history:  1) Metastatic prostate cancer: He is s/p a non-nerve sparing RAL radical prostectomy on January 01, 2008. His postoperative course was complicated by a urine leak which resolved with catheter drainage. His pathology demonstrated pT3b N0 Mx (16 nodes negative), Gleason 4+3=7 (tertiary pattern 5) adenocarcinoma with positive surgical margins at the seminal vesicles. His PSA initially became undetectable after surgery and he proceeded with adjuvant radiation therapy which he completed in June 2010. He developed a biochemical recurrence in October 2010. 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 He began androgen deprivation with Lupron in August 2011. 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 He enrolled in the STRIVE trial in May 2013 randomizing patients to enzalutimide vs. bicalutimide. He was noted to have progression of his bone metastases in September 2013 as well as a rapid increase in his PSA.  2) Left ureteral obstruction: This was diagnosed in August 2011 and it has been managed with ureteral stent drainage. Last stent: 10/04/11 (6 x 24)  3) Testosterone deficiency/osteopenia/CRPC with bone metastases: He takes Vitamin D and calcium supplementation for osteoporosis prevention. Current therapy: Lupron 30 mg (09/19/11), STRIVE trial Last DXA: July 2011 (Osteopenia) Bone treatment: Xgeva 120 mg monthly  Interval history:  He follows up today for further evaluation of his ureteral obstruction and metastatic prostate cancer. He did see Dr. Clelia Croft in consultation and discussed treatment options going forward.  He will begin treatment with abiraterone in the near future under Dr. Alver Fisher care. His back pain which appears to be related to a metastatic lesion at L3 based on his recent MRI is improved although he is requiring oxycodone for pain management at this time. His pain is relatively minimal as long as he does take oxycodone. Without the medication, he is in severe pain. Dr. Clelia Croft has discussed the possibility of radiation therapy to his lumbar spine. He also is due to have his stent change and this has been tentatively scheduled for next week.     Past Medical History Problems  1. History of  Acute Pancreatitis 577.0 2. 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 Prostate Cancer 185  Surgical History Problems  1. History of  Appendectomy 2. History of  Cystoscopy With Insertion Of Ureteral Stent Left 3. History of  Cystoscopy With Insertion Of Ureteral Stent Left 4. History of  Cystoscopy With Insertion Of Ureteral Stent Left 5. History of  Cystoscopy With Insertion Of Ureteral Stent Left 6. History of  Cystoscopy With Insertion Of Ureteral Stent Left 7. History of  Cystoscopy With Insertion Of Ureteral Stent Left 8. History of  Ileostomy 9. History of  Ileostomy Closure 10. History of  Partial Colectomy 11. History of  Prostatect Retropubic Radical W/ Nerve Sparing Laparoscopic 12. History of  Rotator Cuff Repair Bilateral  Current Meds 1. Atorvastatin Calcium 10 MG Oral Tablet; Therapy: 28Jun2013 to 2. Calcium 600 TABS; Therapy: (Recorded:07Jun2013) to 3. Crestor 10 MG Oral Tablet; Therapy: (Recorded:17Apr2012) to 4. EQ Aspirin Low Dose 81 MG TABS; Therapy: (Recorded:17Apr2012) to 5. Immunicare Oral Capsule; Graviola- takes 2 caps tid; Therapy: (Recorded:11Jun2013) to 6. Iron TABS; Therapy: (Recorded:11Jun2013)  to 7. Multiple Vitamin TABS; Therapy: (Recorded:28Sep2011) to 8. Vitamin D TABS; Therapy: (Recorded:16Jan2013) to  Allergies Medication  1.  No Known Drug Allergies  Family History Problems  1. Paternal history of  Adenocarcinoma Of The Pancreas 2. Maternal history of  Asthma V17.5 3. Fraternal history of  Prostate Cancer V16.42  Social History Problems  1. Alcohol Use 2. Former Smoker V15.82 quit smoking in 1981 3. Marital History - Currently Married 4. Occupation: retired Nurse, children's  5. History of  Tobacco Use  Review of Systems  Genitourinary: no hematuria.  Constitutional: no fever and no recent weight loss.    Vitals Vital Signs [Data Includes: Last 1 Day]  27Sep2013 02:45PM  BMI Calculated: 29.62 BSA Calculated: 2.06 Height: 5 ft 9 in Weight: 200 lb  Blood Pressure: 140 / 85 Temperature: 97.8 F Heart Rate: 81 11Sep2013 10:03AM  Blood Pressure: 156 / 82 Temperature: 98.3 F Heart Rate: 85  Physical Exam Constitutional: Well nourished and well developed . No acute distress.  ENT:. The ears and nose are normal in appearance.  Neck: The appearance of the neck is normal and no neck mass is present.  Pulmonary: No respiratory distress and normal respiratory rhythm and effort.  Cardiovascular:. No peripheral edema.  Skin: Normal skin turgor, no visible rash and no visible skin lesions.  Neuro/Psych:. Mood and affect are appropriate.    Results/Data Selected Results  PSA 20Sep2013 10:30AM Heloise Purpura  SPECIMEN TYPE: BLOOD   Test Name Result Flag Reference  PSA 79.68 ng/mL H <=4.00  Test Methodology: ECLIA PSA (Electrochemiluminescence Immunoassay)   TESTOSTERONE 20Sep2013 10:30AM Heloise Purpura  SPECIMEN TYPE: BLOOD   Test Name Result Flag Reference  TESTOSTERONE, TOTAL <10.00 ng/dL L 161-096  Tanner Stage       Male              Male               I              < 30 ng/dL        < 10 ng/dL               II             < 150 ng/dL       < 30 ng/dL               III            100-320 ng/dL     < 35 ng/dL               IV             200-970 ng/dL     04-54 ng/dL               V               300-890 ng/dL     09-81 ng/dL     He was unable to void today.  Assessment Assessed  1. Metastasis Of Malignant Neoplasm To Bone 198.5 2. Prostate Cancer 185 3. Ureteral Obstruction 593.4  Plan Metastasis Of Malignant Neoplasm To Bone (198.5)  1. Xgeva 120 MG/1.7ML Subcutaneous Solution; INJECT 120  MG Subcutaneous; Done:  27Sep2013 03:52PM; Status: COMPLETE Prostate Cancer (185)  2. Lupron Depot 30 MG Intramuscular Kit; INJECT 30 MG Intramuscular; Done: 27Sep2013  04:03PM; Status: COMPLETE 3. BASIC METABOLIC PANEL  Done: 27Sep2013 4. VENIPUNCTURE  Done: 27Sep2013 Ureteral Obstruction (593.4)  5.  Follow-up Month x 3 Office  Follow-up  Requested for: 22Jan2014  Discussion/Summary 1. Metastatic castrate resistant prostate cancer: He will receive Lupron 30 mg today. He is now off his study medication and will plan to begin abiraterone in the near future under the care of Dr. Clelia Croft. I have provided him a new prescription for oxycodone at his request.  2. Bone metastases: I reviewed his bone density study today which indicates persistent osteopenia. He will continue vitamin D and calcium supplementation. His serum calcium level will be checked today. He will continue treatment with Xgeva monthly.  3. Ureteral obstruction: He is scheduled to undergo ureteral stent change next week. We have reviewed the potential risks and complications.  Cc: Dr. Eli Hose Dr. Margaretmary Dys Dr. Elias Else      Signatures Electronically signed by : Heloise Purpura, [name]; Jan 25 2012  8:19PM

## 2012-01-28 ENCOUNTER — Encounter (HOSPITAL_COMMUNITY): Payer: Self-pay | Admitting: Anesthesiology

## 2012-01-28 ENCOUNTER — Encounter (HOSPITAL_COMMUNITY)
Admission: RE | Disposition: A | Discharge status: Home / Self Care | Payer: Self-pay | Source: Ambulatory Visit | Attending: Urology

## 2012-01-28 ENCOUNTER — Ambulatory Visit (HOSPITAL_COMMUNITY)
Admission: RE | Admit: 2012-01-28 | Discharge: 2012-01-28 | Disposition: A | Discharge status: Home / Self Care | Payer: Medicare Other | Source: Ambulatory Visit | Attending: Urology | Admitting: Urology

## 2012-01-28 ENCOUNTER — Encounter (HOSPITAL_COMMUNITY): Payer: Self-pay | Admitting: *Deleted

## 2012-01-28 ENCOUNTER — Ambulatory Visit (HOSPITAL_COMMUNITY): Payer: Medicare Other | Admitting: Anesthesiology

## 2012-01-28 DIAGNOSIS — C61 Malignant neoplasm of prostate: Secondary | ICD-10-CM | POA: Insufficient documentation

## 2012-01-28 DIAGNOSIS — Z79899 Other long term (current) drug therapy: Secondary | ICD-10-CM | POA: Insufficient documentation

## 2012-01-28 DIAGNOSIS — N135 Crossing vessel and stricture of ureter without hydronephrosis: Secondary | ICD-10-CM | POA: Insufficient documentation

## 2012-01-28 DIAGNOSIS — Z01812 Encounter for preprocedural laboratory examination: Secondary | ICD-10-CM | POA: Insufficient documentation

## 2012-01-28 HISTORY — PX: CYSTOSCOPY W/ URETERAL STENT PLACEMENT: SHX1429

## 2012-01-28 SURGERY — CYSTOSCOPY, FLEXIBLE, WITH STENT REPLACEMENT
Anesthesia: General | Site: Ureter | Laterality: Left | Wound class: Clean Contaminated

## 2012-01-28 MED ORDER — LACTATED RINGERS IV SOLN
INTRAVENOUS | Status: DC
  Administered 2012-01-28: 1000 mL via INTRAVENOUS

## 2012-01-28 MED ORDER — FENTANYL CITRATE 0.05 MG/ML IJ SOLN
INTRAMUSCULAR | Status: AC
  Filled 2012-01-28: qty 2

## 2012-01-28 MED ORDER — CIPROFLOXACIN IN D5W 400 MG/200ML IV SOLN
400.0000 mg | INTRAVENOUS | Status: AC
  Administered 2012-01-28: 400 mg via INTRAVENOUS

## 2012-01-28 MED ORDER — PROMETHAZINE HCL 25 MG/ML IJ SOLN: 6.2500 mg | INTRAMUSCULAR | Status: DC | PRN

## 2012-01-28 MED ORDER — IOHEXOL 300 MG/ML  SOLN
INTRAMUSCULAR | Status: AC
  Filled 2012-01-28: qty 1

## 2012-01-28 MED ORDER — CIPROFLOXACIN IN D5W 400 MG/200ML IV SOLN
INTRAVENOUS | Status: AC
  Filled 2012-01-28: qty 200

## 2012-01-28 MED ORDER — PROPOFOL 10 MG/ML IV BOLUS
INTRAVENOUS | Status: DC | PRN
  Administered 2012-01-28: 150 mg via INTRAVENOUS

## 2012-01-28 MED ORDER — HYDROMORPHONE HCL PF 1 MG/ML IJ SOLN: 0.2500 mg | INTRAMUSCULAR | Status: DC | PRN

## 2012-01-28 MED ORDER — FENTANYL CITRATE 0.05 MG/ML IJ SOLN
INTRAMUSCULAR | Status: DC | PRN
  Administered 2012-01-28 (×4): 25 ug via INTRAVENOUS

## 2012-01-28 MED ORDER — LACTATED RINGERS IV SOLN: INTRAVENOUS | Status: DC

## 2012-01-28 MED ORDER — ONDANSETRON HCL 4 MG/2ML IJ SOLN
INTRAMUSCULAR | Status: DC | PRN
  Administered 2012-01-28: 4 mg via INTRAVENOUS

## 2012-01-28 MED ORDER — MIDAZOLAM HCL 5 MG/5ML IJ SOLN
INTRAMUSCULAR | Status: DC | PRN
  Administered 2012-01-28: 1 mg via INTRAVENOUS

## 2012-01-28 MED ORDER — FENTANYL CITRATE 0.05 MG/ML IJ SOLN
50.0000 ug | Freq: Once | INTRAMUSCULAR | Status: AC
  Administered 2012-01-28: 100 ug via INTRAVENOUS

## 2012-01-28 SURGICAL SUPPLY — 14 items
ADAPTER CATH URET PLST 4-6FR (CATHETERS) ×2 IMPLANT
BAG URO CATCHER STRL LF (DRAPE) ×2 IMPLANT
BASKET ZERO TIP NITINOL 2.4FR (BASKET) IMPLANT
CATH INTERMIT  6FR 70CM (CATHETERS) IMPLANT
CLOTH BEACON ORANGE TIMEOUT ST (SAFETY) ×2 IMPLANT
DRAPE CAMERA CLOSED 9X96 (DRAPES) ×2 IMPLANT
GLOVE BIOGEL M STRL SZ7.5 (GLOVE) ×2 IMPLANT
GOWN STRL NON-REIN LRG LVL3 (GOWN DISPOSABLE) ×4 IMPLANT
GUIDEWIRE ANG ZIPWIRE 038X150 (WIRE) IMPLANT
GUIDEWIRE STR DUAL SENSOR (WIRE) ×2 IMPLANT
MANIFOLD NEPTUNE II (INSTRUMENTS) ×2 IMPLANT
PACK CYSTO (CUSTOM PROCEDURE TRAY) ×2 IMPLANT
STENT CONTOUR 6FRX24X.038 (STENTS) ×2 IMPLANT
TUBING CONNECTING 10 (TUBING) ×2 IMPLANT

## 2012-01-28 NOTE — Transfer of Care (Signed)
Immediate Anesthesia Transfer of Care Note  Patient: SHAWNTE DEMAREST  Procedure(s) Performed: Procedure(s) (LRB) with comments: CYSTOSCOPY WITH STENT REPLACEMENT (Left) - LEFT URETERAL STENT CHANGE  C-ARM    Patient Location: PACU  Anesthesia Type: General  Level of Consciousness: awake, alert , oriented and patient cooperative  Airway & Oxygen Therapy: Patient Spontanous Breathing and Patient connected to face mask oxygen  Post-op Assessment: Report given to PACU RN and Post -op Vital signs reviewed and stable  Post vital signs: Reviewed and stable  Complications: No apparent anesthesia complications

## 2012-01-28 NOTE — Preoperative (Signed)
Beta Blockers   Reason not to administer Beta Blockers:Not Applicable 

## 2012-01-28 NOTE — Anesthesia Postprocedure Evaluation (Signed)
Anesthesia Post Note  Patient: Willie Gaines  Procedure(s) Performed: Procedure(s) (LRB): CYSTOSCOPY WITH STENT REPLACEMENT (Left)  Anesthesia type: General  Patient location: PACU  Post pain: Pain level controlled  Post assessment: Post-op Vital signs reviewed  Last Vitals:  Filed Vitals:   01/28/12 1656  BP: 128/80  Pulse: 79  Temp: 37 C  Resp: 16    Post vital signs: Reviewed  Level of consciousness: sedated  Complications: No apparent anesthesia complications

## 2012-01-28 NOTE — Anesthesia Preprocedure Evaluation (Signed)
Anesthesia Evaluation  Patient identified by MRN, date of birth, ID band Patient awake  General Assessment Comment:Metastatic prostate ca  Reviewed: Allergy & Precautions, H&P , NPO status , Patient's Chart, lab work & pertinent test results  Airway Mallampati: II TM Distance: >3 FB Neck ROM: Full    Dental No notable dental hx. (+) Teeth Intact and Caps,    Pulmonary neg pulmonary ROS,  breath sounds clear to auscultation  Pulmonary exam normal       Cardiovascular negative cardio ROS  Rhythm:Regular Rate:Normal     Neuro/Psych negative neurological ROS  negative psych ROS   GI/Hepatic negative GI ROS, Neg liver ROS,   Endo/Other  negative endocrine ROS  Renal/GU negative Renal ROS   History of Prostate CA; S/P Robotic Prostatectomy negative genitourinary   Musculoskeletal negative musculoskeletal ROS (+)   Abdominal   Peds  Hematology negative hematology ROS (+)   Anesthesia Other Findings   Reproductive/Obstetrics negative OB ROS                           Anesthesia Physical Anesthesia Plan  ASA: III  Anesthesia Plan: General   Post-op Pain Management:    Induction: Intravenous  Airway Management Planned: LMA  Additional Equipment:   Intra-op Plan:   Post-operative Plan: Extubation in OR  Informed Consent: I have reviewed the patients History and Physical, chart, labs and discussed the procedure including the risks, benefits and alternatives for the proposed anesthesia with the patient or authorized representative who has indicated his/her understanding and acceptance.   Dental advisory given  Plan Discussed with: CRNA  Anesthesia Plan Comments:         Anesthesia Quick Evaluation

## 2012-01-28 NOTE — Op Note (Signed)
Preoperative diagnosis:  1. Left ureteral obstruction 2. Metastatic prostate cancer   Postoperative diagnosis:  1. Left ureteral obstruction 2. Metastatic prostate cancer   Procedure:  1. Cystoscopy 2. Left ureteral stent placement (6 x 24)  Surgeon: Rolly Salter, Montez Hageman. [name]  Anesthesia: General  Complications: None  EBL: Minimal  Specimens: None  Indication: Willie Gaines is a 71 y.o. patient with ureteral obstruction. After reviewing the management options for treatment, he elected to proceed with the above surgical procedure(s). We have discussed the potential benefits and risks of the procedure, side effects of the proposed treatment, the likelihood of the patient achieving the goals of the procedure, and any potential problems that might occur during the procedure or recuperation. Informed consent has been obtained.  Description of procedure:  The patient was taken to the operating room and general anesthesia was induced.  The patient was placed in the dorsal lithotomy position, prepped and draped in the usual sterile fashion, and preoperative antibiotics were administered. A preoperative time-out was performed.   Cystourethroscopy was performed.  The patient's urethra was examined and was normal except for his prostatic urethra which was surgically absent . The bladder was then systematically examined in its entirety. There was no evidence for any bladder tumors, stones, or other mucosal pathology.    Attention then turned to the left ureteral orifice and the patient's indwelling ureteral stent was identified and brought out to the urethral meatus with the flexible graspers.  A 0.38 sensor guidewire was then advanced up the left ureter into the renal pelvis under fluoroscopic guidance.  The wire was then backloaded through the cystoscope and a ureteral stent was advance over the wire using Seldinger technique.  The stent was positioned appropriately under fluoroscopic and  cystoscopic guidance.  The wire was then removed with an adequate stent curl noted in the renal pelvis as well as in the bladder.  The bladder was then emptied and the procedure ended.  The patient appeared to tolerate the procedure well and without complications.  The patient was able to be awakened and transferred to the recovery unit in satisfactory condition.    Moody Bruins MD

## 2012-01-28 NOTE — Interval H&P Note (Signed)
History and Physical Interval Note:  01/28/2012 3:01 PM  Willie Gaines  has presented today for surgery, with the diagnosis of Left Ureteral Obstruction  The various methods of treatment have been discussed with the patient and family. After consideration of risks, benefits and other options for treatment, the patient has consented to  Procedure(s) (LRB) with comments: CYSTOSCOPY WITH STENT REPLACEMENT (Left) - LEFT URETERAL STENT CHANGE  C-ARM   as a surgical intervention .  The patient's history has been reviewed, patient examined, no change in status, stable for surgery.  I have reviewed the patient's chart and labs.  Questions were answered to the patient's satisfaction.     Lamar Naef,LES

## 2012-02-08 ENCOUNTER — Telehealth: Payer: Self-pay | Admitting: Oncology

## 2012-02-08 ENCOUNTER — Other Ambulatory Visit (HOSPITAL_BASED_OUTPATIENT_CLINIC_OR_DEPARTMENT_OTHER): Payer: 59 | Admitting: Lab

## 2012-02-08 ENCOUNTER — Ambulatory Visit (HOSPITAL_BASED_OUTPATIENT_CLINIC_OR_DEPARTMENT_OTHER): Payer: 59 | Admitting: Oncology

## 2012-02-08 VITALS — BP 143/93 | HR 79 | Temp 98.6°F | Resp 20 | Ht 69.0 in | Wt 201.9 lb

## 2012-02-08 DIAGNOSIS — C61 Malignant neoplasm of prostate: Secondary | ICD-10-CM

## 2012-02-08 DIAGNOSIS — C7952 Secondary malignant neoplasm of bone marrow: Secondary | ICD-10-CM

## 2012-02-08 DIAGNOSIS — C7951 Secondary malignant neoplasm of bone: Secondary | ICD-10-CM

## 2012-02-08 LAB — COMPREHENSIVE METABOLIC PANEL (CC13)
Albumin: 3.5 g/dL (ref 3.5–5.0)
Alkaline Phosphatase: 219 U/L — ABNORMAL HIGH (ref 40–150)
BUN: 9 mg/dL (ref 7.0–26.0)
Creatinine: 1.3 mg/dL (ref 0.7–1.3)
Glucose: 109 mg/dl — ABNORMAL HIGH (ref 70–99)
Potassium: 4.1 mEq/L (ref 3.5–5.1)

## 2012-02-08 LAB — CBC WITH DIFFERENTIAL/PLATELET
Basophils Absolute: 0 10*3/uL (ref 0.0–0.1)
Eosinophils Absolute: 0.3 10*3/uL (ref 0.0–0.5)
HCT: 37.5 % — ABNORMAL LOW (ref 38.4–49.9)
HGB: 12.2 g/dL — ABNORMAL LOW (ref 13.0–17.1)
LYMPH%: 31.5 % (ref 14.0–49.0)
MCH: 22.7 pg — ABNORMAL LOW (ref 27.2–33.4)
MCV: 70 fL — ABNORMAL LOW (ref 79.3–98.0)
MONO%: 8.9 % (ref 0.0–14.0)
NEUT#: 3.7 10*3/uL (ref 1.5–6.5)
NEUT%: 54.8 % (ref 39.0–75.0)
Platelets: 309 10*3/uL (ref 140–400)
RDW: 14.6 % (ref 11.0–14.6)

## 2012-02-08 NOTE — Progress Notes (Signed)
Hematology and Oncology Follow Up Visit  Willie Gaines [id number] 11-19-1940 71 y.o. 02/08/2012 9:16 AM   Principle Diagnosis:  71 year-old gentleman with Castration-resistant prostate cancer. He has disease to the bone. Diagnosed 2009. Gleason score 4+3 = 7 PSA 9.5   Prior Therapy: He underwent a radical prostatectomy on January 01, 2008, and the pathology from that, case number [id number], by Dr. Laverle Patter showed a prostatic adenocarcinoma, Gleason score of 4+3=7, involves both lobes. Tumor extends into the extracapsular tissue, involvement of the seminal vesicle. Lymph nodes were negative, and his pathological staging of T3b N0.  The patient did very well, did received adjuvant radiation therapy due to positive margins, and PSA was undetectable post surgery.  The patient developed biochemical relapse in October of 2010 July of 2011 and presented with metastatic disease and ureteral obstruction in August of 2011. The patient started with androgen deprivation at that time as well had a stent placement The patient did well initially with hormonal deprivation up until March 2013, where he developed castration-resistant disease, again with a bone scan showed  metastatic bony involvement. The patient was enrolled in the STRIVE trial, a randomized trial for enzalutamide versus bicalutamide and after  90 days of therapy, his PSA was up to 54 and a repeat bone scan on January 03, 2012, showed a progression of disease with increase widespread skeletal metastasis.   Current therapy: Zytiga 1000 mg daily started on 02/01/2012.   Interim History:  Willie Gaines presents today for a follow up visit. He reports he is doing well. No complications from Zytiga. He reports no vomiting, but some nausea. His back pain is a lot better at this time. He has not taken Oxycodone for the last 3 days. He does not any genitourinary  complaints. He is very active, has an excellent performance status, does not report any  deterioration in his health. No leg edema or any other symptoms.    Medications: I have reviewed the patient's current medications. Current outpatient prescriptions:abiraterone Acetate (ZYTIGA) 250 MG tablet, Take 4 tablets (1,000 mg total) by mouth daily. Take on an empty stomach 1 hour before or 2 hours after a meal, Disp: 120 tablet, Rfl: 0;  aspirin EC 81 MG tablet, Take 81 mg by mouth daily after breakfast., Disp: , Rfl: ;  atorvastatin (LIPITOR) 10 MG tablet, Take 10 mg by mouth at bedtime., Disp: , Rfl:  Calcium Citrate-Vitamin D (CITRACAL + D PO), Take 2 tablets by mouth daily., Disp: , Rfl: ;  denosumab (XGEVA) 120 MG/1.7ML SOLN, Inject 120 mg into the skin every 30 (thirty) days., Disp: , Rfl: ;  Hydrocodone-Acetaminophen 5-300 MG TABS, Take 1 tablet by mouth every 6 (six) hours., Disp: 30 each, Rfl: 0;  IRON CR PO, Take 1 tablet by mouth daily., Disp: , Rfl:  Leuprolide Acetate (LUPRON IJ), Inject as directed. EVERY 6 MONTHS-AT DR. BORDEN'S OFFICE, Disp: , Rfl: ;  Multiple Vitamin (MULITIVITAMIN WITH MINERALS) TABS, Take 1 tablet by mouth daily., Disp: , Rfl: ;  OVER THE COUNTER MEDICATION, Take 20 drops by mouth 2 (two) times daily. Graviola, Disp: , Rfl: ;  oxyCODONE (OXY IR/ROXICODONE) 5 MG immediate release tablet, Take 5-10 mg by mouth Every 6 hours as needed., Disp: , Rfl:   Allergies:  Allergies  Allergen Reactions  . Other     SOFT SHELL CRAB-HIVES ALL OTHER    Past Medical History, Surgical history, Social history, and Family History were reviewed and updated.  Review of Systems: Constitutional:  Negative for fever, chills, night sweats, anorexia, weight loss, pain. Cardiovascular: no chest pain or dyspnea on exertion Respiratory: negative Neurological: negative Dermatological: negative ENT: negative Skin: Negative. Gastrointestinal: negative Genito-Urinary: negative Hematological and Lymphatic: negative Breast: negative Musculoskeletal: negative Remaining ROS  negative. Physical Exam: Blood pressure 143/93, pulse 79, temperature 98.6 F (37 C), temperature source Oral, resp. rate 20, height 5\' 9"  (1.753 m), weight 201 lb 14.4 oz (91.581 kg). ECOG: 0 General appearance: alert Head: Normocephalic, without obvious abnormality, atraumatic Neck: no adenopathy, no carotid bruit, no JVD, supple, symmetrical, trachea midline and thyroid not enlarged, symmetric, no tenderness/mass/nodules Lymph nodes: Cervical, supraclavicular, and axillary nodes normal. Heart:regular rate and rhythm, S1, S2 normal, no murmur, click, rub or gallop Lung:chest clear, no wheezing, rales, normal symmetric air entry Abdomin: soft, non-tender, without masses or organomegaly EXT:no erythema, induration, or nodules   Lab Results: Lab Results  Component Value Date   WBC 6.8 02/08/2012   HGB 12.2* 02/08/2012   HCT 37.5* 02/08/2012   MCV 70.0* 02/08/2012   PLT 309 02/08/2012     Chemistry      Component Value Date/Time   NA 133* 01/24/2012 1040   NA 137 01/16/2012 1209   K 4.5 01/24/2012 1040   K 4.4 01/16/2012 1209   CL 99 01/24/2012 1040   CL 102 01/16/2012 1209   CO2 25 01/24/2012 1040   CO2 23 01/16/2012 1209   BUN 16 01/24/2012 1040   BUN 15.0 01/16/2012 1209   CREATININE 1.30 01/24/2012 1040   CREATININE 1.5* 01/16/2012 1209      Component Value Date/Time   CALCIUM 9.2 01/24/2012 1040   CALCIUM 9.6 01/16/2012 1209   ALKPHOS 188* 01/16/2012 1209   ALKPHOS 71 03/10/2010 1805   AST 18 01/16/2012 1209   AST 27 03/10/2010 1805   ALT 12 01/16/2012 1209   ALT 20 03/10/2010 1805   BILITOT 0.40 01/16/2012 1209   BILITOT 0.5 03/10/2010 1805         Impression and Plan: This is a 71 year old gentleman with the following  issues:  1. Castration-resistant prostate cancer. He is on Zytiga for the last week. No complications for the time being. The plan is to continue that and follow him monthly and follow his PSA as well.   2. Back pain and L3 involvement of his cancer.  I  think he will probably benefit from radiation therapy at some point. He is minimally symptomatic  from this, but I fear that this might be a problem down the line. He is known to Dr. Kathrynn Running. I will have a low threshold to send  him to Dr. Kathrynn Running for radiation therapy.   3. Hormonal deprivation. This will continue with the Lupron.   4. Bone-directed therapy. I have recommended continued Xgeva at this time. He is receiving both Lupron and Xgeva at Dr. Vevelyn Royals  office, and I told him that I will be happy to continue those here if he would like, but for the time being it does not really matter  where he gets it as long as he is receiving both post treatments.   5. Pain, very minimal at this point. Not taking any Oxycodone but he has them.    Amelya Mabry, MD 10/11/20139:16 AM

## 2012-02-08 NOTE — Telephone Encounter (Signed)
appts made and printed for pt aom °

## 2012-02-09 ENCOUNTER — Emergency Department (HOSPITAL_COMMUNITY): Payer: Medicare Other

## 2012-02-09 ENCOUNTER — Encounter (HOSPITAL_COMMUNITY): Payer: Self-pay | Admitting: Emergency Medicine

## 2012-02-09 ENCOUNTER — Emergency Department (HOSPITAL_COMMUNITY)
Admission: EM | Admit: 2012-02-09 | Discharge: 2012-02-09 | Disposition: A | Discharge status: Home / Self Care | Payer: Medicare Other | Attending: Emergency Medicine | Admitting: Emergency Medicine

## 2012-02-09 DIAGNOSIS — R109 Unspecified abdominal pain: Secondary | ICD-10-CM | POA: Insufficient documentation

## 2012-02-09 DIAGNOSIS — C7952 Secondary malignant neoplasm of bone marrow: Secondary | ICD-10-CM | POA: Insufficient documentation

## 2012-02-09 DIAGNOSIS — R112 Nausea with vomiting, unspecified: Secondary | ICD-10-CM | POA: Insufficient documentation

## 2012-02-09 DIAGNOSIS — C7951 Secondary malignant neoplasm of bone: Secondary | ICD-10-CM | POA: Insufficient documentation

## 2012-02-09 DIAGNOSIS — C61 Malignant neoplasm of prostate: Secondary | ICD-10-CM | POA: Insufficient documentation

## 2012-02-09 LAB — CBC WITH DIFFERENTIAL/PLATELET
Basophils Absolute: 0 10*3/uL (ref 0.0–0.1)
Eosinophils Absolute: 0.1 10*3/uL (ref 0.0–0.7)
Lymphocytes Relative: 17 % (ref 12–46)
MCHC: 32.8 g/dL (ref 30.0–36.0)
Monocytes Relative: 9 % (ref 3–12)
Neutro Abs: 4.8 10*3/uL (ref 1.7–7.7)
Neutrophils Relative %: 73 % (ref 43–77)
Platelets: 305 10*3/uL (ref 150–400)
RBC: 5.22 MIL/uL (ref 4.22–5.81)
RDW: 14.2 % (ref 11.5–15.5)

## 2012-02-09 LAB — URINE MICROSCOPIC-ADD ON

## 2012-02-09 LAB — COMPREHENSIVE METABOLIC PANEL
ALT: 9 U/L (ref 0–53)
Albumin: 3.6 g/dL (ref 3.5–5.2)
Alkaline Phosphatase: 215 U/L — ABNORMAL HIGH (ref 39–117)
Chloride: 99 mEq/L (ref 96–112)
Glucose, Bld: 119 mg/dL — ABNORMAL HIGH (ref 70–99)
Potassium: 3.8 mEq/L (ref 3.5–5.1)
Sodium: 134 mEq/L — ABNORMAL LOW (ref 135–145)
Total Protein: 7.3 g/dL (ref 6.0–8.3)

## 2012-02-09 LAB — URINALYSIS, ROUTINE W REFLEX MICROSCOPIC
Bilirubin Urine: NEGATIVE
Specific Gravity, Urine: 1.014 (ref 1.005–1.030)
pH: 7.5 (ref 5.0–8.0)

## 2012-02-09 MED ORDER — PROMETHAZINE HCL 25 MG PO TABS: 25.0000 mg | ORAL_TABLET | Freq: Four times a day (QID) | ORAL | Status: DC | PRN

## 2012-02-09 MED ORDER — PROMETHAZINE HCL 25 MG/ML IJ SOLN
12.5000 mg | Freq: Once | INTRAMUSCULAR | Status: AC
  Administered 2012-02-09: 12.5 mg via INTRAVENOUS
  Filled 2012-02-09: qty 1

## 2012-02-09 MED ORDER — ONDANSETRON 8 MG PO TBDP: 8.0000 mg | ORAL_TABLET | Freq: Three times a day (TID) | ORAL | Status: DC | PRN

## 2012-02-09 MED ORDER — SODIUM CHLORIDE 0.9 % IV SOLN
1000.0000 mL | INTRAVENOUS | Status: DC
  Administered 2012-02-09: 1000 mL via INTRAVENOUS

## 2012-02-09 MED ORDER — ONDANSETRON HCL 4 MG/2ML IJ SOLN
4.0000 mg | Freq: Once | INTRAMUSCULAR | Status: AC
  Administered 2012-02-09: 4 mg via INTRAVENOUS
  Filled 2012-02-09: qty 2

## 2012-02-09 MED ORDER — SODIUM CHLORIDE 0.9 % IV SOLN
1000.0000 mL | Freq: Once | INTRAVENOUS | Status: AC
  Administered 2012-02-09: 1000 mL via INTRAVENOUS

## 2012-02-09 NOTE — ED Notes (Signed)
Reports nausea & vomitingx15 started last night around 2100hrs. Skin is pale & jaundice. New prescription started  Jean Rosenthal unsure if this is related.

## 2012-02-09 NOTE — ED Notes (Signed)
Pt discharged to home. DC instructions given using tech back. No concerns voiced. Wife at bedside and signed at  Discharge. Prescriptions x 2 given for antiemetic.

## 2012-02-09 NOTE — ED Provider Notes (Signed)
History     CSN: [id number]  Arrival date & time 02/09/12  0803   First MD Initiated Contact with Patient 02/09/12 0818      Chief Complaint  Patient presents with  . Nausea    (Consider location/radiation/quality/duration/timing/severity/associated sxs/prior treatment) Patient is a 71 y.o. male presenting with vomiting. The history is provided by the patient.  Emesis  This is a new problem. The current episode started yesterday. The problem occurs more than 10 times per day. The problem has not changed since onset.The emesis has an appearance of stomach contents and bilious material. There has been no fever. Pertinent negatives include no abdominal pain, no cough, no diarrhea and no fever.  Pt does have history of prostate CA.  It has spread and caused a ureteral obstruction.  He had a stent placed.  Past Medical History  Diagnosis Date  . Blood transfusion     ?   [name] Cancer     hx of prostate cancer -S/P ROBOTIC PROSTATECTOMY --THEN RADIATION TX FOR 4 MONTHS--;LAST TX JUNE 2010 ?     NOW HAS METASTATIC DISEASE AND LEFT URETERAL OBSTRUCTION  AND  HAS LEFT URETERAL  STENT    Past Surgical History  Procedure Date  . Other surgical history     cystoscopy x multiple times with stent change   . Other surgical history     prostatectomy   . Cystoscopy w/ ureteral stent placement 06/01/2011    Procedure: CYSTOSCOPY WITH STENT REPLACEMENT;  Surgeon: Crecencio Mc, MD;  Location: [facility];  Service: Urology;  Laterality: N/A;  left stent   . Robot assisted laparoscopic radical prostatectomy 2009  . Rotator cuff repair     BILATERAL  . Appendectomy 1955  . Eye surgery 2012    7/12 and 8/12  CATARACT EXTRACTIONS  . Colon surgery     04/11/09 LAPAROSCOPIC ASSISTED RIGHT HEMI-COLECTOMY FOR TUBULOVILLOUS ADENOMA AND POLYP.  04/25/09 EXPLORATORY LAP, LYSIS  ADHESIONS, EVACUATION INFECTED HEMATOMA AND ABSCESS AND DIVERTING LOOP ILEOSTOMY.   08/05/09  LYSIS OF ADHESIONS AND ILEOSTOMY TAKEDOWN.  [name]  Cystoscopy w/ ureteral stent placement 10/04/2011    Procedure: CYSTOSCOPY WITH STENT REPLACEMENT;  Surgeon: Crecencio Mc, MD;  Location: [facility];  Service: Urology;  Laterality: Left;  . Cystoscopy w/ ureteral stent placement 01/28/2012    Procedure: CYSTOSCOPY WITH STENT REPLACEMENT;  Surgeon: Crecencio Mc, MD;  Location: [facility];  Service: Urology;  Laterality: Left;  LEFT URETERAL STENT CHANGE  C-ARM      History reviewed. No pertinent family history.  History  Substance Use Topics  . Smoking status: Former Smoker    Quit date: 04/30/1978  . Smokeless tobacco: Never Used  . Alcohol Use: Yes     occasional       Review of Systems  Constitutional: Negative for fever.  Respiratory: Negative for cough.   Gastrointestinal: Positive for vomiting. Negative for abdominal pain and diarrhea.  All other systems reviewed and are negative.    Allergies  Other  Home Medications   Current Outpatient Rx  Name Route Sig Dispense Refill  . ABIRATERONE ACETATE 250 MG PO TABS Oral Take 4 tablets (1,000 mg total) by mouth daily. Take on an empty stomach 1 hour before or 2 hours after a meal 120 tablet 0  . ASPIRIN EC 81 MG PO TBEC Oral Take 81 mg by mouth daily after breakfast.    . ATORVASTATIN CALCIUM 10 MG PO TABS Oral Take 10 mg by mouth at bedtime.    [name]  CITRACAL + D PO Oral Take 2 tablets by mouth daily.    . DENOSUMAB 120 MG/1.7ML [place] SOLN Subcutaneous Inject 120 mg into the skin every 30 (thirty) days.    [name] HYDROCODONE-ACETAMINOPHEN 5-300 MG PO TABS Oral Take 1 tablet by mouth every 6 (six) hours. 30 each 0  . IRON CR PO Oral Take 1 tablet by mouth daily.    [name] LUPRON IJ Injection Inject as directed. EVERY 6 MONTHS-AT DR. BORDEN'S OFFICE    . ADULT MULTIVITAMIN W/MINERALS CH Oral Take 1 tablet by mouth daily.    [name] OVER THE COUNTER MEDICATION Oral Take 20 drops by mouth 2 (two) times daily. Graviola    . OXYCODONE HCL 5 MG PO TABS Oral Take 5-10 mg by mouth Every 6 hours as needed.       BP 168/80  Pulse 70  Temp 98.8 F (37.1 C) (Oral)  Resp 16  SpO2 96%  Physical Exam  Nursing note and vitals reviewed. Constitutional: He appears well-developed and well-nourished. Distressed: intermittent vomiting.  HENT:  Head: Normocephalic and atraumatic.  Right Ear: External ear normal.  Left Ear: External ear normal.  Eyes: Conjunctivae normal are normal. Right eye exhibits no discharge. Left eye exhibits no discharge. No scleral icterus.  Neck: Neck supple. No tracheal deviation present.  Cardiovascular: Normal rate, regular rhythm and intact distal pulses.   Pulmonary/Chest: Effort normal and breath sounds normal. No stridor. No respiratory distress. He has no wheezes. He has no rales.  Abdominal: Soft. Bowel sounds are normal. He exhibits no distension. There is no tenderness. There is no rebound and no guarding.  Musculoskeletal: He exhibits no edema and no tenderness.  Neurological: He is alert. He has normal strength. No sensory deficit. Cranial nerve deficit:  no gross defecits noted. He exhibits normal muscle tone. He displays no seizure activity. Coordination normal.  Skin: Skin is warm and dry. No petechiae and no rash noted.       No jaundice   Psychiatric: He has a normal mood and affect.    ED Course  Procedures (including critical care time)  Labs Reviewed  COMPREHENSIVE METABOLIC PANEL - Abnormal; Notable for the following:    Sodium 134 (*)     Glucose, Bld 119 (*)     Alkaline Phosphatase 215 (*)     GFR calc non Af Amer 61 (*)     GFR calc Af Amer 71 (*)     All other components within normal limits  URINALYSIS, ROUTINE W REFLEX MICROSCOPIC - Abnormal; Notable for the following:    Hgb urine dipstick TRACE (*)     Leukocytes, UA TRACE (*)     All other components within normal limits  CBC WITH DIFFERENTIAL - Abnormal; Notable for the following:    Hemoglobin 11.7 (*)     HCT 35.7 (*)     MCV 68.4 (*)     MCH 22.4 (*)     All other  components within normal limits  LIPASE, BLOOD  URINE MICROSCOPIC-ADD ON   Dg Abd Acute W/chest  02/09/2012  *RADIOLOGY REPORT*  Clinical Data: Abdominal pain and nausea. Metastatic prostate carcinoma. Recent left ureteral stent placement  ACUTE ABDOMEN SERIES (ABDOMEN 2 VIEW & CHEST 1 VIEW)  Comparison: None  Findings: The left ureteral stent is seen in the expected position. No radiopaque urinary calculi identified.  Several pelvic phleboliths noted.  The bowel gas pattern is normal.  No evidence of free air.  An mass-like staples seen  within the right abdomen. Sclerotic bone metastasis seen involving the L3 vertebral body and within the pelvis.  The heart size is within normal limits.  Both lungs are clear. Sclerotic bone lesions are seen involving the right scapula and several ribs, consistent with sclerotic bone metastases.  IMPRESSION:  1.  Normal bowel gas pattern. 2.  Left ureteral stent in appropriate position. 3.  No active cardiopulmonary disease. 4.  Sclerotic bone metastases.   Original Report Authenticated By: Danae Orleans, [name]      1. Nausea and vomiting       MDM  Patient has no evidence of serious dehydration based on his laboratory results. He has no tenderness on exam. His laboratory results are unremarkable. I doubt bowel obstruction. It is possible his symptoms could be related to his chemotherapy agents but there does not seem to be any serious complication associated with it. He does not have any evidence of jaundice or significant liver abnormalities. Patient was given IV fluids and anti-emetics. He tolerated oral fluids in the emergency department. He'll be discharged home with Zofran        Celene Kras, MD 02/09/12 1424

## 2012-02-09 NOTE — ED Notes (Signed)
Gave patient some ginger ale per MD Lynelle Doctor

## 2012-02-11 ENCOUNTER — Telehealth: Payer: Self-pay | Admitting: *Deleted

## 2012-02-11 NOTE — Telephone Encounter (Signed)
Spoke to patient this am, he has had n/v over the weekend and has not taken his zytiga. Per dr Clelia Croft, take zofran and phenergan, wait and try clear liquids first, then maybe some chicken noodle soup. Called patient back at 1650 and he has not vomited, has had water, soup and is drinking propel. He will take his zytiga and vitamins starting tomorrow. Note to dr Clelia Croft.

## 2012-02-13 ENCOUNTER — Telehealth: Payer: Self-pay | Admitting: *Deleted

## 2012-02-13 NOTE — Telephone Encounter (Signed)
Patient calling back to say after he felt better yesterday and ate and drank w/o n/v, he took his zytiga. patient vomited at 3:00 am this morning. Patient is at the airport now on his way to his daughter's wedding. Per kristen, stop zytiga this weekend. Re start on Monday and let us know how he feels. Patient verbalizes understanding.

## 2012-03-04 ENCOUNTER — Other Ambulatory Visit: Payer: Self-pay | Admitting: Oncology

## 2012-03-12 ENCOUNTER — Encounter: Payer: Self-pay | Admitting: Oncology

## 2012-03-12 ENCOUNTER — Ambulatory Visit (HOSPITAL_BASED_OUTPATIENT_CLINIC_OR_DEPARTMENT_OTHER): Payer: Medicare Other | Admitting: Oncology

## 2012-03-12 ENCOUNTER — Other Ambulatory Visit (HOSPITAL_BASED_OUTPATIENT_CLINIC_OR_DEPARTMENT_OTHER): Payer: Medicare Other | Admitting: Lab

## 2012-03-12 ENCOUNTER — Telehealth: Payer: Self-pay | Admitting: Oncology

## 2012-03-12 VITALS — BP 150/91 | HR 82 | Temp 97.1°F | Resp 20 | Ht 69.0 in | Wt 192.6 lb

## 2012-03-12 DIAGNOSIS — C61 Malignant neoplasm of prostate: Secondary | ICD-10-CM | POA: Insufficient documentation

## 2012-03-12 DIAGNOSIS — C7951 Secondary malignant neoplasm of bone: Secondary | ICD-10-CM

## 2012-03-12 DIAGNOSIS — M549 Dorsalgia, unspecified: Secondary | ICD-10-CM

## 2012-03-12 DIAGNOSIS — E291 Testicular hypofunction: Secondary | ICD-10-CM

## 2012-03-12 LAB — CBC WITH DIFFERENTIAL/PLATELET
BASO%: 0.5 % (ref 0.0–2.0)
EOS%: 4.9 % (ref 0.0–7.0)
HCT: 38 % — ABNORMAL LOW (ref 38.4–49.9)
LYMPH%: 38.6 % (ref 14.0–49.0)
MCH: 23.4 pg — ABNORMAL LOW (ref 27.2–33.4)
MCHC: 33.8 g/dL (ref 32.0–36.0)
MONO#: 0.6 10*3/uL (ref 0.1–0.9)
NEUT%: 45.9 % (ref 39.0–75.0)
RBC: 5.49 10*6/uL (ref 4.20–5.82)
WBC: 5.9 10*3/uL (ref 4.0–10.3)
lymph#: 2.3 10*3/uL (ref 0.9–3.3)

## 2012-03-12 LAB — COMPREHENSIVE METABOLIC PANEL (CC13)
ALT: 9 U/L (ref 0–55)
AST: 21 U/L (ref 5–34)
Alkaline Phosphatase: 289 U/L — ABNORMAL HIGH (ref 40–150)
CO2: 20 mEq/L — ABNORMAL LOW (ref 22–29)
Creatinine: 1.2 mg/dL (ref 0.7–1.3)
Sodium: 138 mEq/L (ref 136–145)
Total Bilirubin: 0.59 mg/dL (ref 0.20–1.20)
Total Protein: 6.5 g/dL (ref 6.4–8.3)

## 2012-03-12 MED ORDER — ZOLPIDEM TARTRATE 5 MG PO TABS: 5.0000 mg | ORAL_TABLET | Freq: Every evening | ORAL | Status: DC | PRN

## 2012-03-12 NOTE — Telephone Encounter (Signed)
appts made and printed for  Pt aom °

## 2012-03-12 NOTE — Progress Notes (Signed)
Hematology and Oncology Follow Up Visit  Willie Gaines [id number] 1941-04-22 71 y.o. 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 Diagnosed 2009. Gleason score 4+3 = 7 PSA 9.5   Prior Therapy: He underwent a radical prostatectomy on January 01, 2008, and the pathology from that, case number [id number], by Dr. Laverle Patter showed a prostatic adenocarcinoma, Gleason score of 4+3=7, involves both lobes. Tumor extends into the extracapsular tissue, involvement of the seminal vesicle. Lymph nodes were negative, and his pathological staging of T3b N0.  The patient did very well, did received adjuvant radiation therapy due to positive margins, and PSA was undetectable post surgery.  The patient developed biochemical relapse in October of 2010 July of 2011 and presented with metastatic disease and ureteral obstruction in August of 2011. The patient started with androgen deprivation at that time as well had a stent placement The patient did well initially with hormonal deprivation up until March 2013, where he developed castration-resistant disease, again with a bone scan showed  metastatic bony involvement. The patient was enrolled in the STRIVE trial, a randomized trial for enzalutamide versus bicalutamide and after  90 days of therapy, his PSA was up to 54 and a repeat bone scan on January 03, 2012, showed a progression of disease with increase widespread skeletal metastasis.   Current therapy: Zytiga 1000 mg daily started on 02/01/2012.   Interim History:  Willie Gaines presents today for a follow up visit. He had problems with nausea and vomiting that he though was related to the Zytiga. He stopped all of his medications for approximately one week. He restarted as IT only. He has not had any further nausea or vomiting. He does report increased fatigue, decreased appetite. He has lost about 9 pounds in one month. He has  not yet restarted any of his other medications other than the as Zytiga at this time. His back pain is a lot better at this time.  He does not any genitourinary complaints. He is very active, has an excellent performance status, does not report any deterioration in his health. No leg edema or any other symptoms.    Medications: I have reviewed the patient's current medications. Current outpatient prescriptions:aspirin EC 81 MG tablet, Take 81 mg by mouth daily after breakfast., Disp: , Rfl: ;  atorvastatin (LIPITOR) 10 MG tablet, Take 10 mg by mouth at bedtime., Disp: , Rfl: ;  Calcium Citrate-Vitamin D (CITRACAL + D PO), Take 2 tablets by mouth daily., Disp: , Rfl: ;  denosumab (XGEVA) 120 MG/1.7ML SOLN, Inject 120 mg into the skin every 30 (thirty) days., Disp: , Rfl:  diphenhydramine-acetaminophen (TYLENOL PM) 25-500 MG TABS, Take 1 tablet by mouth at bedtime as needed. PAIN/SLEEP, Disp: , Rfl: ;  ferrous sulfate 325 (65 FE) MG tablet, Take 325 mg by mouth daily with breakfast., Disp: , Rfl: ;  Leuprolide Acetate (LUPRON IJ), Inject as directed. EVERY 6 MONTHS-AT DR. BORDEN'S OFFICE, Disp: , Rfl: ;  Multiple Vitamin (MULITIVITAMIN WITH MINERALS) TABS, Take 1 tablet by mouth daily., Disp: , Rfl:  ondansetron (ZOFRAN ODT) 8 MG disintegrating tablet, Take 1 tablet (8 mg total) by mouth every 8 (eight) hours as needed for nausea., Disp: 20 tablet, Rfl: 0;  OVER THE COUNTER MEDICATION, Take 20 drops by mouth 2 (two) times daily. Graviola, Disp: , Rfl: ;  oxyCODONE (OXY IR/ROXICODONE) 5 MG immediate release tablet, Take 5-10 mg by mouth Every 6 hours  as needed. PAIN, Disp: , Rfl:  promethazine (PHENERGAN) 25 MG tablet, Take 1 tablet (25 mg total) by mouth every 6 (six) hours as needed for nausea., Disp: 30 tablet, Rfl: 0;  zolpidem (AMBIEN) 5 MG tablet, Take 1 tablet (5 mg total) by mouth at bedtime as needed for sleep., Disp: 30 tablet, Rfl: 1;  ZYTIGA 250 MG tablet, TAKE 4 TABLETS BY MOUTH ONCE DAILY. TAKE ON  EMPTY STOMACH 1 HOUR BEFORE OR 2 HOURS AFTER A MEAL., Disp: 120 tablet, Rfl: 0  Allergies:  Allergies  Allergen Reactions  . Other     SOFT SHELL CRAB-HIVES ALL OTHER    Past Medical History, Surgical history, Social history, and Family History were reviewed and updated.  Review of Systems: Constitutional:  Negative for fever, chills, night sweats, anorexia, weight loss, pain. Cardiovascular: no chest pain or dyspnea on exertion Respiratory: negative Neurological: negative Dermatological: negative ENT: negative Skin: Negative. Gastrointestinal: negative Genito-Urinary: negative Hematological and Lymphatic: negative Breast: negative Musculoskeletal: negative Remaining ROS negative.  Physical Exam: Blood pressure 150/91, pulse 82, temperature 97.1 F (36.2 C), temperature source Oral, resp. rate 20, height 5\' 9"  (1.753 m), weight 192 lb 9.6 oz (87.363 kg). ECOG: 0 General appearance: alert Head: Normocephalic, without obvious abnormality, atraumatic Neck: no adenopathy, no carotid bruit, no JVD, supple, symmetrical, trachea midline and thyroid not enlarged, symmetric, no tenderness/mass/nodules Lymph nodes: Cervical, supraclavicular, and axillary nodes normal. Heart:regular rate and rhythm, S1, S2 normal, no murmur, click, rub or gallop Lung:chest clear, no wheezing, rales, normal symmetric air entry Abdomen: soft, non-tender, without masses or organomegaly EXT:no erythema, induration, or nodules   Lab Results: Lab Results  Component Value Date   WBC 5.9 03/12/2012   HGB 12.8* 03/12/2012   HCT 38.0* 03/12/2012   MCV 69.3* 03/12/2012   PLT 267 03/12/2012     Chemistry      Component Value Date/Time   NA 138 03/12/2012 0909   NA 134* 02/09/2012 0935   K 3.6 03/12/2012 0909   K 3.8 02/09/2012 0935   CL 110* 03/12/2012 0909   CL 99 02/09/2012 0935   CO2 20* 03/12/2012 0909   CO2 24 02/09/2012 0935   BUN 8.0 03/12/2012 0909   BUN 9 02/09/2012 0935   CREATININE  1.2 03/12/2012 0909   CREATININE 1.15 02/09/2012 0935      Component Value Date/Time   CALCIUM 8.4 03/12/2012 0909   CALCIUM 8.7 02/09/2012 0935   ALKPHOS 289* 03/12/2012 0909   ALKPHOS 215* 02/09/2012 0935   AST 21 03/12/2012 0909   AST 18 02/09/2012 0935   ALT 9 03/12/2012 0909   ALT 9 02/09/2012 0935   BILITOT 0.59 03/12/2012 0909   BILITOT 0.3 02/09/2012 0935     Results for ZAYAAN, KOZAK (MRN [id number]) as of 03/12/2012 15:26  Ref. Range 09/25/2007 09:25 02/08/2012 08:43  PSA Latest Range: <=4.00 ng/mL 6.48... (H) 83.07 (H)    Impression and Plan: This is a 71 year old gentleman with the following issues:   1. Castration-resistant prostate cancer. He is on Zytiga. He had nausea and vomiting. This has now resolved. It is unclear if it was related to his Zytiga or not. He has increased fatigue with a decreased appetite. Recommend that he continue the Zytiga, but try taking it at night to reduce the fatigue. PSA is pending.  2. Back pain and L3 involvement of his cancer.  I think he will probably benefit from radiation therapy at some point. He is minimally symptomatic  from  this, but I fear that this might be a problem down the line. He is known to Dr. Kathrynn Running. I will have a low threshold to send him to Dr. Kathrynn Running for radiation therapy.   3. Hormonal deprivation. This will continue with the Lupron.   4. Bone-directed therapy. I have recommended continue Xgeva at this time. He is receiving both Lupron and Xgeva at Dr. Vevelyn Royals  office, and I told him that I will be happy to continue those here if he would like, but for the time being it does not really matter  where he gets it as long as he is receiving both post treatments.   5. Pain, very minimal at this point. Not taking any Oxycodone but he has them.   6. Insomnia. I have prescribed Ambien to be used at bedtime as needed.  7. followup. The patient will be seen back in 4 weeks time.   Clenton Pare 11/13/20133:26 PM

## 2012-03-12 NOTE — Patient Instructions (Addendum)
Results for Willie Gaines, Willie Gaines (MRN [id number]) as of 03/12/2012 09:36  Ref. Range 09/25/2007 09:25 02/08/2012 08:43  PSA Latest Range: <=4.00 ng/mL 6.48... (H) 83.07 (H)

## 2012-03-21 ENCOUNTER — Encounter: Payer: Self-pay | Admitting: Oncology

## 2012-03-21 ENCOUNTER — Telehealth: Payer: Self-pay | Admitting: *Deleted

## 2012-03-21 NOTE — Telephone Encounter (Signed)
Called pt w/ lab results from 03/12/12 per Clenton Pare, NP.  Explained that lab results can take up to 10 days to cross over to MyChart.  Pt verbalized understanding.

## 2012-04-04 ENCOUNTER — Other Ambulatory Visit: Payer: Self-pay | Admitting: Oncology

## 2012-04-16 ENCOUNTER — Telehealth: Payer: Self-pay | Admitting: Oncology

## 2012-04-16 ENCOUNTER — Other Ambulatory Visit (HOSPITAL_BASED_OUTPATIENT_CLINIC_OR_DEPARTMENT_OTHER): Payer: Medicare Other | Admitting: Lab

## 2012-04-16 ENCOUNTER — Ambulatory Visit (HOSPITAL_BASED_OUTPATIENT_CLINIC_OR_DEPARTMENT_OTHER): Payer: Medicare Other | Admitting: Oncology

## 2012-04-16 VITALS — BP 148/86 | HR 72 | Temp 98.9°F | Resp 18 | Ht 69.0 in | Wt 190.0 lb

## 2012-04-16 DIAGNOSIS — C61 Malignant neoplasm of prostate: Secondary | ICD-10-CM

## 2012-04-16 DIAGNOSIS — C7951 Secondary malignant neoplasm of bone: Secondary | ICD-10-CM

## 2012-04-16 DIAGNOSIS — C7952 Secondary malignant neoplasm of bone marrow: Secondary | ICD-10-CM

## 2012-04-16 DIAGNOSIS — M549 Dorsalgia, unspecified: Secondary | ICD-10-CM

## 2012-04-16 DIAGNOSIS — G4700 Insomnia, unspecified: Secondary | ICD-10-CM

## 2012-04-16 LAB — CBC WITH DIFFERENTIAL/PLATELET
BASO%: 0.1 % (ref 0.0–2.0)
Basophils Absolute: 0 10*3/uL (ref 0.0–0.1)
EOS%: 6.1 % (ref 0.0–7.0)
HCT: 37.8 % — ABNORMAL LOW (ref 38.4–49.9)
HGB: 12.4 g/dL — ABNORMAL LOW (ref 13.0–17.1)
LYMPH%: 39 % (ref 14.0–49.0)
MCH: 23 pg — ABNORMAL LOW (ref 27.2–33.4)
MCHC: 32.9 g/dL (ref 32.0–36.0)
MCV: 70.1 fL — ABNORMAL LOW (ref 79.3–98.0)
NEUT%: 45.7 % (ref 39.0–75.0)
Platelets: 275 10*3/uL (ref 140–400)

## 2012-04-16 LAB — COMPREHENSIVE METABOLIC PANEL (CC13)
ALT: 9 U/L (ref 0–55)
AST: 20 U/L (ref 5–34)
BUN: 10 mg/dL (ref 7.0–26.0)
CO2: 23 mEq/L (ref 22–29)
Calcium: 8.5 mg/dL (ref 8.4–10.4)
Chloride: 108 mEq/L — ABNORMAL HIGH (ref 98–107)
Creatinine: 1.3 mg/dL (ref 0.7–1.3)
Total Bilirubin: 0.62 mg/dL (ref 0.20–1.20)

## 2012-04-16 LAB — PSA: PSA: 58.41 ng/mL — ABNORMAL HIGH (ref <=4.00)

## 2012-04-16 NOTE — Telephone Encounter (Signed)
gv and printed appt schedule to pt for Jan 2014 °

## 2012-04-16 NOTE — Progress Notes (Signed)
Hematology and Oncology Follow Up Visit  Willie Gaines [id number] 1940/10/30 71 y.o. 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 Diagnosed 2009. Gleason score 4+3 = 7 PSA 9.5  Prior Therapy: He underwent a radical prostatectomy on January 01, 2008, and the pathology from that, case number [id number], by Dr. Laverle Patter showed a prostatic adenocarcinoma, Gleason score of 4+3=7, involves both lobes. Tumor extends into the extracapsular tissue, involvement of the seminal vesicle. Lymph nodes were negative, and his pathological staging of T3b N0.  The patient did very well, did received adjuvant radiation therapy due to positive margins, and PSA was undetectable post surgery.  The patient developed biochemical relapse in October of 2010 July of 2011 and presented with metastatic disease and ureteral obstruction in August of 2011. The patient started with androgen deprivation at that time as well had a stent placement The patient did well initially with hormonal deprivation up until March 2013, where he developed castration-resistant disease, again with a bone scan showed  metastatic bony involvement. The patient was enrolled in the STRIVE trial, a randomized trial for enzalutamide versus bicalutamide and after  90 days of therapy, his PSA was up to 54 and a repeat bone scan on January 03, 2012, showed a progression of disease with increase widespread skeletal metastasis.   Current therapy: Zytiga 1000 mg daily started on 02/01/2012.   Interim History:  Willie Gaines presents today for a follow up visit. He had problems with nausea and vomiting that he though was related to the Zytiga. He stopped all of his medications for approximately one week. He restarted Zytiga only. He has not had any further nausea or vomiting. He does report increased energy since last time. He has lost about 9 pounds in one month but now is  stable. He has not yet restarted any of his other medications other than the as Zytiga at this time. His back pain is a lot better at this time.  He does not any genitourinary complaints. He is very active, has an excellent performance status, does not report any deterioration in his health. No leg edema or any other symptoms.    Medications: I have reviewed the patient's current medications. Current outpatient prescriptions:aspirin EC 81 MG tablet, Take 81 mg by mouth daily after breakfast., Disp: , Rfl: ;  atorvastatin (LIPITOR) 10 MG tablet, Take 10 mg by mouth at bedtime., Disp: , Rfl: ;  Calcium Citrate-Vitamin D (CITRACAL + D PO), Take 2 tablets by mouth daily., Disp: , Rfl: ;  denosumab (XGEVA) 120 MG/1.7ML SOLN, Inject 120 mg into the skin every 30 (thirty) days., Disp: , Rfl:  Leuprolide Acetate (LUPRON IJ), Inject as directed. EVERY 6 MONTHS-AT DR. BORDEN'S OFFICE, Disp: , Rfl: ;  ondansetron (ZOFRAN ODT) 8 MG disintegrating tablet, Take 1 tablet (8 mg total) by mouth every 8 (eight) hours as needed for nausea., Disp: 20 tablet, Rfl: 0;  oxyCODONE (OXY IR/ROXICODONE) 5 MG immediate release tablet, Take 5-10 mg by mouth Every 6 hours as needed. PAIN, Disp: , Rfl:  promethazine (PHENERGAN) 25 MG tablet, Take 1 tablet (25 mg total) by mouth every 6 (six) hours as needed for nausea., Disp: 30 tablet, Rfl: 0;  zolpidem (AMBIEN) 5 MG tablet, Take 1 tablet (5 mg total) by mouth at bedtime as needed for sleep., Disp: 30 tablet, Rfl: 1;  ZYTIGA 250 MG tablet, TAKE 4 TABLETS BY MOUTH ONCE DAILY. TAKE ON EMPTY  STOMACH 1 HOUR BEFORE OR 2 HOURS AFTER A MEAL., Disp: 120 tablet, Rfl: 0  Allergies:  Allergies  Allergen Reactions  . Other     SOFT SHELL CRAB-HIVES ALL OTHER    Past Medical History, Surgical history, Social history, and Family History were reviewed and updated.  Review of Systems: Constitutional:  Negative for fever, chills, night sweats, anorexia, weight loss, pain. Cardiovascular: no  chest pain or dyspnea on exertion Respiratory: negative Neurological: negative Dermatological: negative ENT: negative Skin: Negative. Gastrointestinal: negative Genito-Urinary: negative Hematological and Lymphatic: negative Breast: negative Musculoskeletal: negative Remaining ROS negative.  Physical Exam: Blood pressure 148/86, pulse 72, temperature 98.9 F (37.2 C), temperature source Oral, resp. rate 18, height 5\' 9"  (1.753 m), weight 190 lb (86.183 kg). ECOG: 0 General appearance: alert Head: Normocephalic, without obvious abnormality, atraumatic Neck: no adenopathy, no carotid bruit, no JVD, supple, symmetrical, trachea midline and thyroid not enlarged, symmetric, no tenderness/mass/nodules Lymph nodes: Cervical, supraclavicular, and axillary nodes normal. Heart:regular rate and rhythm, S1, S2 normal, no murmur, click, rub or gallop Lung:chest clear, no wheezing, rales, normal symmetric air entry Abdomen: soft, non-tender, without masses or organomegaly EXT:no erythema, induration, or nodules   Lab Results: Lab Results  Component Value Date   WBC 4.8 04/16/2012   HGB 12.4* 04/16/2012   HCT 37.8* 04/16/2012   MCV 70.1* 04/16/2012   PLT 275 04/16/2012     Chemistry      Component Value Date/Time   NA 138 03/12/2012 0909   NA 134* 02/09/2012 0935   K 3.6 03/12/2012 0909   K 3.8 02/09/2012 0935   CL 110* 03/12/2012 0909   CL 99 02/09/2012 0935   CO2 20* 03/12/2012 0909   CO2 24 02/09/2012 0935   BUN 8.0 03/12/2012 0909   BUN 9 02/09/2012 0935   CREATININE 1.2 03/12/2012 0909   CREATININE 1.15 02/09/2012 0935      Component Value Date/Time   CALCIUM 8.4 03/12/2012 0909   CALCIUM 8.7 02/09/2012 0935   ALKPHOS 289* 03/12/2012 0909   ALKPHOS 215* 02/09/2012 0935   AST 21 03/12/2012 0909   AST 18 02/09/2012 0935   ALT 9 03/12/2012 0909   ALT 9 02/09/2012 0935   BILITOT 0.59 03/12/2012 0909   BILITOT 0.3 02/09/2012 0935      Results for Willie Gaines, Willie Gaines (MRN  [id number]) as of 04/16/2012 09:44  Ref. Range 02/08/2012 08:43 03/12/2012 09:09  PSA Latest Range: <=4.00 ng/mL 83.07 (H) 63.83 (H)    Impression and Plan: This is a 71 year old gentleman with the following issues:   1. Castration-resistant prostate cancer. He is on Zytiga. He had nausea and vomiting that has resolved. I Recommend that he continue the Zytiga at night to reduce the fatigue. This has helped in the last month. His PSA dropped down as well.  PSA from today is better.  2. Back pain and L3 involvement of his cancer.  I think he will probably benefit from radiation therapy at some point. He is minimally symptomatic  from this, but I fear that this might be a problem down the line. He is known to Dr. Kathrynn Running. I will have a low threshold to send him to Dr. Kathrynn Running for radiation therapy.   3. Hormonal deprivation. This will continue with the Lupron.   4. Bone-directed therapy. I have recommended continue Xgeva at this time. He is receiving both Lupron and Xgeva at Dr. Vevelyn Royals  office, and I told him that I will be happy to continue those  here if he would like, but for the time being it does not really matter  where he gets it as long as he is receiving both post treatments.   5. Pain, very minimal at this point. Not taking any Oxycodone but he has them.   6. Insomnia. I have prescribed Ambien to be used at bedtime as needed.  7. followup. The patient will be seen back in 4 weeks time.   Ammanda Dobbins 12/18/201310:05 AM

## 2012-04-16 NOTE — Telephone Encounter (Signed)
Pt requested early appt schedule lab and est for 1.15.14

## 2012-04-17 ENCOUNTER — Telehealth: Payer: Self-pay | Admitting: *Deleted

## 2012-04-17 NOTE — Telephone Encounter (Signed)
Called patient with last PSA result. 

## 2012-05-05 ENCOUNTER — Other Ambulatory Visit: Payer: Self-pay | Admitting: Oncology

## 2012-05-05 NOTE — Telephone Encounter (Signed)
Received message from pt (taken off from triage), stating that he needed something for pain.  Called pt back, and he states he needs a refill on his Zytiga, and also wanted to know if Dr. Clelia Croft would refill his oxycodone that he takes prn for his back pain, which was originally prescribed by Dr. Vevelyn Royals office in 12/2011.  Pt states he would just like to have everything in one place.  Pt states his pain is stable and has not increased, and he only uses the medication sparingly.  Spoke with Clenton Pare, NP- will have to check with Dr. Clelia Croft upon return to office tomorrow.  Informed pt of this, and he verbalizes understanding.

## 2012-05-06 ENCOUNTER — Other Ambulatory Visit: Payer: Self-pay | Admitting: *Deleted

## 2012-05-06 MED ORDER — OXYCODONE HCL 5 MG PO TABS
5.0000 mg | ORAL_TABLET | Freq: Four times a day (QID) | ORAL | Status: DC | PRN
Start: 2012-05-06 — End: 2012-06-20

## 2012-05-14 ENCOUNTER — Other Ambulatory Visit (HOSPITAL_BASED_OUTPATIENT_CLINIC_OR_DEPARTMENT_OTHER): Payer: Medicare Other | Admitting: Lab

## 2012-05-14 ENCOUNTER — Telehealth: Payer: Self-pay | Admitting: Oncology

## 2012-05-14 ENCOUNTER — Ambulatory Visit (HOSPITAL_BASED_OUTPATIENT_CLINIC_OR_DEPARTMENT_OTHER): Payer: Medicare Other | Admitting: Oncology

## 2012-05-14 ENCOUNTER — Encounter: Payer: Self-pay | Admitting: Oncology

## 2012-05-14 VITALS — BP 159/89 | HR 83 | Temp 98.5°F | Resp 20 | Ht 69.0 in | Wt 183.1 lb

## 2012-05-14 DIAGNOSIS — C61 Malignant neoplasm of prostate: Secondary | ICD-10-CM

## 2012-05-14 DIAGNOSIS — M549 Dorsalgia, unspecified: Secondary | ICD-10-CM

## 2012-05-14 DIAGNOSIS — F329 Major depressive disorder, single episode, unspecified: Secondary | ICD-10-CM

## 2012-05-14 DIAGNOSIS — C7952 Secondary malignant neoplasm of bone marrow: Secondary | ICD-10-CM

## 2012-05-14 LAB — PSA: PSA: 62.21 ng/mL — ABNORMAL HIGH (ref <=4.00)

## 2012-05-14 LAB — CBC WITH DIFFERENTIAL/PLATELET
BASO%: 0.2 % (ref 0.0–2.0)
LYMPH%: 36.6 % (ref 14.0–49.0)
MCHC: 33.3 g/dL (ref 32.0–36.0)
MONO#: 0.5 10*3/uL (ref 0.1–0.9)
Platelets: 293 10*3/uL (ref 140–400)
RBC: 5.52 10*6/uL (ref 4.20–5.82)
WBC: 5.1 10*3/uL (ref 4.0–10.3)
lymph#: 1.9 10*3/uL (ref 0.9–3.3)

## 2012-05-14 LAB — COMPREHENSIVE METABOLIC PANEL (CC13)
ALT: <6 U/L (ref 0–55)
AST: 15 U/L (ref 5–34)
CO2: 20 mEq/L — ABNORMAL LOW (ref 22–29)
Sodium: 139 mEq/L (ref 136–145)
Total Bilirubin: 0.51 mg/dL (ref 0.20–1.20)
Total Protein: 7 g/dL (ref 6.4–8.3)

## 2012-05-14 NOTE — Progress Notes (Signed)
Hematology and Oncology Follow Up Visit  Willie Gaines [id number] [birth year] 72 y.o. 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 Diagnosed 2009. Gleason score 4+3 = 7 PSA 9.5  Prior Therapy: He underwent a radical prostatectomy on January 01, 2008, and the pathology from that, case number [id number], by Dr. Laverle Patter showed a prostatic adenocarcinoma, Gleason score of 4+3=7, involves both lobes. Tumor extends into the extracapsular tissue, involvement of the seminal vesicle. Lymph nodes were negative, and his pathological staging of T3b N0.  The patient did very well, did received adjuvant radiation therapy due to positive margins, and PSA was undetectable post surgery.  The patient developed biochemical relapse in October of 2010 July of 2011 and presented with metastatic disease and ureteral obstruction in August of 2011. The patient started with androgen deprivation at that time as well had a stent placement The patient did well initially with hormonal deprivation up until March 2013, where he developed castration-resistant disease, again with a bone scan showed  metastatic bony involvement. The patient was enrolled in the STRIVE trial, a randomized trial for enzalutamide versus bicalutamide and after  90 days of therapy, his PSA was up to 54 and a repeat bone scan on January 03, 2012, showed a progression of disease with increase widespread skeletal metastasis.   Current therapy: Zytiga 1000 mg daily started on 02/01/2012.   Interim History:  Mr. Willie Gaines presents today for a follow up visit. Remains on Zytiga and takes this at night due to fatigue. He is able to exercise. He has lost about 7 lbs in 1 month. Appetite is poor. Wife states he forget to eat. No taking in any supplements. He has not yet restarted any of his other medications other than the as Zytiga at this time. His back pain is a lot better  at this time.  Using Oxycodone intermittently. He does not any genitourinary complaints. He is very active, has an excellent performance status, does not report any deterioration in his health. No leg edema or any other symptoms. Wife thinks he is depressed. Patient admits to being tearful this past weekend.   Medications: I have reviewed the patient's current medications. Current outpatient prescriptions:aspirin EC 81 MG tablet, Take 81 mg by mouth daily after breakfast., Disp: , Rfl: ;  atorvastatin (LIPITOR) 10 MG tablet, Take 10 mg by mouth at bedtime., Disp: , Rfl: ;  Calcium Citrate-Vitamin D (CITRACAL + D PO), Take 2 tablets by mouth daily., Disp: , Rfl: ;  denosumab (XGEVA) 120 MG/1.7ML SOLN, Inject 120 mg into the skin every 30 (thirty) days., Disp: , Rfl:  Leuprolide Acetate (LUPRON IJ), Inject as directed. EVERY 6 MONTHS-AT DR. BORDEN'S OFFICE, Disp: , Rfl: ;  ondansetron (ZOFRAN ODT) 8 MG disintegrating tablet, Take 1 tablet (8 mg total) by mouth every 8 (eight) hours as needed for nausea., Disp: 20 tablet, Rfl: 0;  oxyCODONE (OXY IR/ROXICODONE) 5 MG immediate release tablet, Take 1-2 tablets (5-10 mg total) by mouth every 6 (six) hours as needed for pain. PAIN, Disp: 60 tablet, Rfl: 0 promethazine (PHENERGAN) 25 MG tablet, Take 1 tablet (25 mg total) by mouth every 6 (six) hours as needed for nausea., Disp: 30 tablet, Rfl: 0;  zolpidem (AMBIEN) 5 MG tablet, Take 1 tablet (5 mg total) by mouth at bedtime as needed for sleep., Disp: 30 tablet, Rfl: 1;  ZYTIGA 250 MG tablet, TAKE 4 TABLETS BY MOUTH ONCE DAILY. TAKE  ON EMPTY STOMACH 1 HOUR BEFORE OR 2 HOURS AFTER A MEAL., Disp: 120 tablet, Rfl: 0  Allergies:  Allergies  Allergen Reactions  . Other     SOFT SHELL CRAB-HIVES ALL OTHER    Past Medical History, Surgical history, Social history, and Family History were reviewed and updated.  Review of Systems: Constitutional:  Negative for fever, chills, night sweats, anorexia, weight loss,  pain. Cardiovascular: no chest pain or dyspnea on exertion Respiratory: negative Neurological: negative Dermatological: negative ENT: negative Skin: Negative. Gastrointestinal: negative Genito-Urinary: negative Hematological and Lymphatic: negative Breast: negative Musculoskeletal: negative Remaining ROS negative.  Physical Exam: Blood pressure 159/89, pulse 83, temperature 98.5 F (36.9 C), temperature source Oral, resp. rate 20, height 5\' 9"  (1.753 m), weight 183 lb 1.6 oz (83.054 kg). ECOG: 0 General appearance: alert Head: Normocephalic, without obvious abnormality, atraumatic Neck: no adenopathy, no carotid bruit, no JVD, supple, symmetrical, trachea midline and thyroid not enlarged, symmetric, no tenderness/mass/nodules Lymph nodes: Cervical, supraclavicular, and axillary nodes normal. Heart:regular rate and rhythm, S1, S2 normal, no murmur, click, rub or gallop Lung:chest clear, no wheezing, rales, normal symmetric air entry Abdomen: soft, non-tender, without masses or organomegaly EXT:no erythema, induration, or nodules   Lab Results: Lab Results  Component Value Date   WBC 5.1 05/14/2012   HGB 12.6* 05/14/2012   HCT 37.8* 05/14/2012   MCV 68.4* 05/14/2012   PLT 293 05/14/2012     Chemistry      Component Value Date/Time   NA 139 05/14/2012 0837   NA 134* 02/09/2012 0935   K 3.4* 05/14/2012 0837   K 3.8 02/09/2012 0935   CL 110* 05/14/2012 0837   CL 99 02/09/2012 0935   CO2 20* 05/14/2012 0837   CO2 24 02/09/2012 0935   BUN 13.0 05/14/2012 0837   BUN 9 02/09/2012 0935   CREATININE 1.2 05/14/2012 0837   CREATININE 1.15 02/09/2012 0935      Component Value Date/Time   CALCIUM 8.4 05/14/2012 0837   CALCIUM 8.7 02/09/2012 0935   ALKPHOS 229* 05/14/2012 0837   ALKPHOS 215* 02/09/2012 0935   AST 15 05/14/2012 0837   AST 18 02/09/2012 0935   ALT <6 Repeated and Verified 05/14/2012 0837   ALT 9 02/09/2012 0935   BILITOT 0.51 05/14/2012 0837   BILITOT 0.3 02/09/2012 0935      Results for DEVLYN, RETTER (MRN [id number]) as of 05/14/2012 08:37  Ref. Range 09/25/2007 09:25 02/08/2012 08:43 03/12/2012 09:09 04/16/2012 09:36  PSA Latest Range: <=4.00 ng/mL 6.48... (H) 83.07 (H) 63.83 (H) 58.41 (H)    Impression and Plan: This is a 72 year old gentleman with the following issues:   1. Castration-resistant prostate cancer. He is on Zytiga. He had nausea and vomiting that has resolved. I Recommend that he continue the Zytiga at night to reduce the fatigue. This has helped in the last month. His PSA dropped down as well. PSA is pending today.  2. Back pain and L3 involvement of his cancer.  I think he will probably benefit from radiation therapy at some point. He is minimally symptomatic  from this, but I fear that this might be a problem down the line. He is known to Dr. Kathrynn Running. I will have a low threshold to send him to Dr. Kathrynn Running for radiation therapy. I have discussed pain medication use with him. He is reluctant to take pain meds. Discussed that he should take Oxycodone when pain level is 3/10.  3. Hormonal deprivation. This will continue with the Lupron.  Due March 2014 at [facility].  4. Bone-directed therapy. I have recommended continue Xgeva at this time. He is receiving both Lupron and Xgeva at Dr. Vevelyn Royals  office, and I told him that I will be happy to continue those here if he would like, but for the time being it does not really matter  where he gets it as long as he is receiving both post treatments.   5. Protein calorie malnutrition. Recommended that the patient eat 5-6 small meals per day and add in a supplement such as Boost or Ensure 2-3 times per day. I have offered him Megace, but he has declined. Prefers to see how he does over the next 1 month. He knows he can call prior to the next visit and we can prescribe this if he changes his mind. He has declined a referral to the Dietician.  6. Depression. Wife states he has been depressed in the past. I have  offered a prescription for Lexapro, but he has declined this.   7. Insomnia. I have prescribed Ambien to be used at bedtime as needed.  8. followup. The patient will be seen back in 4 weeks time.   Kentrail Shew 1/15/201410:25 AM

## 2012-05-14 NOTE — Telephone Encounter (Signed)
gv and printed appt schedule for pt for Feb..the patient needed Thursday appt.Marland KitchenMarland KitchenDone

## 2012-05-14 NOTE — Patient Instructions (Signed)
Results for Willie Gaines, Willie Gaines (MRN [id number]) as of 05/14/2012 08:39  Ref. Range 02/08/2012 08:43 03/12/2012 09:09 04/16/2012 09:36  PSA Latest Range: <=4.00 ng/mL 83.07 (H) 63.83 (H) 58.41 (H)

## 2012-05-27 ENCOUNTER — Other Ambulatory Visit: Payer: Self-pay | Admitting: Urology

## 2012-06-06 ENCOUNTER — Other Ambulatory Visit: Payer: Self-pay | Admitting: Oncology

## 2012-06-11 ENCOUNTER — Encounter (HOSPITAL_COMMUNITY): Payer: Self-pay | Admitting: Pharmacy Technician

## 2012-06-13 ENCOUNTER — Other Ambulatory Visit (HOSPITAL_COMMUNITY): Payer: Self-pay | Admitting: Urology

## 2012-06-13 NOTE — Patient Instructions (Addendum)
Willie Gaines  06/13/2012   Your procedure is scheduled on: 06-20-2012  Report to [facility] at 1130 AM.  Call this number if you have problems the morning of surgery [phone]   Remember:   Do not eat food  :After Midnight.  Clear liquids midnight until 0800 am day of surgery, then nothing by mouth.   Take these medicines the morning of surgery with A SIP OF WATER: zytiga, oxycodone if needed                                SEE [facility] PREPARING FOR SURGERY SHEET   Do not wear jewelry, make-up or nail polish.  Do not wear lotions, powders, or perfumes. You may wear deodorant.   Men may shave face and neck.  Do not bring valuables to the hospital.  Contacts, dentures or bridgework may not be worn into surgery.     Patients discharged the day of surgery will not be allowed to drive home.  Name and phone number of your driver: Bonita Quin (wife) [phone]     Please read over the following fact sheets that you were given: MRSA Information, clear liquids fact sheet  Call Birdie Sons RN pre op nurse if needed [phone]    FAILURE TO FOLLOW THESE INSTRUCTIONS MAY RESULT IN THE CANCELLATION OF YOUR SURGERY. PATIENT SIGNATURE___________________________________________

## 2012-06-13 NOTE — Progress Notes (Signed)
EKG 09-25-2011 EPIC DG ABD ACUTE WITH CHEST 1 VIEW 09-25-2011 EPIC

## 2012-06-14 ENCOUNTER — Other Ambulatory Visit: Payer: Self-pay

## 2012-06-16 ENCOUNTER — Encounter (HOSPITAL_COMMUNITY)
Admission: RE | Admit: 2012-06-16 | Discharge: 2012-06-16 | Disposition: A | Discharge status: Home / Self Care | Payer: Medicare Other | Source: Ambulatory Visit | Attending: Urology | Admitting: Urology

## 2012-06-16 ENCOUNTER — Encounter (HOSPITAL_COMMUNITY): Payer: Self-pay

## 2012-06-16 LAB — BASIC METABOLIC PANEL
BUN: 8 mg/dL (ref 6–23)
CO2: 24 mEq/L (ref 19–32)
Calcium: 7.6 mg/dL — ABNORMAL LOW (ref 8.4–10.5)
Glucose, Bld: 101 mg/dL — ABNORMAL HIGH (ref 70–99)
Sodium: 139 mEq/L (ref 135–145)

## 2012-06-16 LAB — CBC
HCT: 38.9 % — ABNORMAL LOW (ref 39.0–52.0)
MCH: 23.1 pg — ABNORMAL LOW (ref 26.0–34.0)
MCV: 68.6 fL — ABNORMAL LOW (ref 78.0–100.0)
RDW: 15.2 % (ref 11.5–15.5)
WBC: 5.4 10*3/uL (ref 4.0–10.5)

## 2012-06-19 ENCOUNTER — Other Ambulatory Visit: Payer: Medicare Other | Admitting: Lab

## 2012-06-19 ENCOUNTER — Ambulatory Visit (HOSPITAL_BASED_OUTPATIENT_CLINIC_OR_DEPARTMENT_OTHER): Payer: Medicare Other | Admitting: Oncology

## 2012-06-19 ENCOUNTER — Telehealth: Payer: Self-pay | Admitting: Oncology

## 2012-06-19 VITALS — BP 165/92 | HR 77 | Temp 98.2°F | Resp 18 | Ht 69.0 in | Wt 181.0 lb

## 2012-06-19 DIAGNOSIS — M549 Dorsalgia, unspecified: Secondary | ICD-10-CM

## 2012-06-19 DIAGNOSIS — C7951 Secondary malignant neoplasm of bone: Secondary | ICD-10-CM

## 2012-06-19 DIAGNOSIS — C61 Malignant neoplasm of prostate: Secondary | ICD-10-CM

## 2012-06-19 LAB — CBC WITH DIFFERENTIAL/PLATELET
Basophils Absolute: 0 10*3/uL (ref 0.0–0.1)
Eosinophils Absolute: 0.2 10*3/uL (ref 0.0–0.5)
HGB: 12.5 g/dL — ABNORMAL LOW (ref 13.0–17.1)
LYMPH%: 33.7 % (ref 14.0–49.0)
MCH: 22.3 pg — ABNORMAL LOW (ref 27.2–33.4)
MCV: 68.5 fL — ABNORMAL LOW (ref 79.3–98.0)
MONO%: 7.6 % (ref 0.0–14.0)
NEUT#: 3.4 10*3/uL (ref 1.5–6.5)
Platelets: 250 10*3/uL (ref 140–400)
RBC: 5.63 10*6/uL (ref 4.20–5.82)

## 2012-06-19 LAB — COMPREHENSIVE METABOLIC PANEL (CC13)
Alkaline Phosphatase: 231 U/L — ABNORMAL HIGH (ref 40–150)
BUN: 8.2 mg/dL (ref 7.0–26.0)
Creatinine: 1.1 mg/dL (ref 0.7–1.3)
Glucose: 104 mg/dl — ABNORMAL HIGH (ref 70–99)
Total Bilirubin: 0.69 mg/dL (ref 0.20–1.20)

## 2012-06-19 NOTE — Progress Notes (Signed)
Faxed a copy of patient's blood pressures from today's visit and from previous visit, to dr [name] reade, his primary physician.

## 2012-06-19 NOTE — Progress Notes (Signed)
Hematology and Oncology Follow Up Visit  Willie Gaines [id number] [birth year] 72 y.o. 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 Diagnosed 2009. Gleason score 4+3 = 7 PSA 9.5  Prior Therapy: He underwent a radical prostatectomy on January 01, 2008, and the pathology from that, case number [id number], by Dr. Laverle Patter showed a prostatic adenocarcinoma, Gleason score of 4+3=7, involves both lobes. Tumor extends into the extracapsular tissue, involvement of the seminal vesicle. Lymph nodes were negative, and his pathological staging of T3b N0.  The patient did very well, did received adjuvant radiation therapy due to positive margins, and PSA was undetectable post surgery.  The patient developed biochemical relapse in October of 2010 July of 2011 and presented with metastatic disease and ureteral obstruction in August of 2011. The patient started with androgen deprivation at that time as well had a stent placement The patient did well initially with hormonal deprivation up until March 2013, where he developed castration-resistant disease, again with a bone scan showed  metastatic bony involvement. The patient was enrolled in the STRIVE trial, a randomized trial for enzalutamide versus bicalutamide and after  90 days of therapy, his PSA was up to 54 and a repeat bone scan on January 03, 2012, showed a progression of disease with increase widespread skeletal metastasis.   Current therapy: Zytiga 1000 mg daily started on 02/01/2012.   Interim History:  Willie Gaines presents today for a follow up visit. He temains on Zytiga and takes it in the am with better tolerance. He reports less fatigue now. He is able to exercise. His appetite is better at this time.His back pain is a lot better at this time. He is using Oxycodone rarly. He does not any genitourinary complaints. He is very active, has an excellent performance  status, does not report any deterioration in his health. No leg edema or any other symptoms. No new complaints related to Zytiga. He is scheduled for a cytoscopy and left stent.   Medications: I have reviewed the patient's current medications. Current outpatient prescriptions:aspirin EC 81 MG tablet, Take 81 mg by mouth daily after breakfast., Disp: , Rfl: ;  atorvastatin (LIPITOR) 10 MG tablet, Take 10 mg by mouth at bedtime., Disp: , Rfl: ;  Calcium Citrate-Vitamin D (CITRACAL + D PO), Take 2 tablets by mouth daily., Disp: , Rfl: ;  denosumab (XGEVA) 120 MG/1.7ML SOLN, Inject 120 mg into the skin every 30 (thirty) days., Disp: , Rfl:  Leuprolide Acetate (LUPRON IJ), Inject as directed. EVERY 6 MONTHS-AT DR. BORDEN'S OFFICE, Disp: , Rfl: ;  oxyCODONE (OXY IR/ROXICODONE) 5 MG immediate release tablet, Take 1-2 tablets (5-10 mg total) by mouth every 6 (six) hours as needed for pain. PAIN, Disp: 60 tablet, Rfl: 0;  zolpidem (AMBIEN) 5 MG tablet, Take 1 tablet (5 mg total) by mouth at bedtime as needed for sleep., Disp: 30 tablet, Rfl: 1 ZYTIGA 250 MG tablet, TAKE 4 TABLETS BY MOUTH ONCE DAILY. TAKE ON EMPTY STOMACH 1 HOUR BEFORE OR 2 HOURS AFTER A MEAL., Disp: 120 tablet, Rfl: 0  Allergies:  Allergies  Allergen Reactions  . Other     SOFT SHELL CRAB-HIVES ALL OTHER    Past Medical History, Surgical history, Social history, and Family History were reviewed and updated.  Review of Systems: Constitutional:  Negative for fever, chills, night sweats, anorexia, weight loss, pain. Cardiovascular: no chest pain or dyspnea on exertion Respiratory: negative Neurological: negative  Dermatological: negative ENT: negative Skin: Negative. Gastrointestinal: negative Genito-Urinary: negative Hematological and Lymphatic: negative Breast: negative Musculoskeletal: negative Remaining ROS negative.  Physical Exam: Blood pressure 165/92, pulse 77, temperature 98.2 F (36.8 C), temperature source Oral, resp.  rate 18, height 5\' 9"  (1.753 m), weight 181 lb (82.101 kg). ECOG: 0 General appearance: alert Head: Normocephalic, without obvious abnormality, atraumatic Neck: no adenopathy, no carotid bruit, no JVD, supple, symmetrical, trachea midline and thyroid not enlarged, symmetric, no tenderness/mass/nodules Lymph nodes: Cervical, supraclavicular, and axillary nodes normal. Heart:regular rate and rhythm, S1, S2 normal, no murmur, click, rub or gallop Lung:chest clear, no wheezing, rales, normal symmetric air entry Abdomen: soft, non-tender, without masses or organomegaly EXT:no erythema, induration, or nodules   Lab Results: Lab Results  Component Value Date   WBC 6.2 06/19/2012   HGB 12.5* 06/19/2012   HCT 38.5 06/19/2012   MCV 68.5* 06/19/2012   PLT 250 06/19/2012     Chemistry      Component Value Date/Time   NA 139 06/16/2012 0920   NA 139 05/14/2012 0837   K 3.9 06/16/2012 0920   K 3.4* 05/14/2012 0837   CL 106 06/16/2012 0920   CL 110* 05/14/2012 0837   CO2 24 06/16/2012 0920   CO2 20* 05/14/2012 0837   BUN 8 06/16/2012 0920   BUN 13.0 05/14/2012 0837   CREATININE 1.10 06/16/2012 0920   CREATININE 1.2 05/14/2012 0837      Component Value Date/Time   CALCIUM 7.6* 06/16/2012 0920   CALCIUM 8.4 05/14/2012 0837   ALKPHOS 229* 05/14/2012 0837   ALKPHOS 215* 02/09/2012 0935   AST 15 05/14/2012 0837   AST 18 02/09/2012 0935   ALT <6 Repeated and Verified 05/14/2012 0837   ALT 9 02/09/2012 0935   BILITOT 0.51 05/14/2012 0837   BILITOT 0.3 02/09/2012 0935      Results for Willie, Gaines (MRN [id number]) as of 06/19/2012 10:01  Ref. Range 02/08/2012 08:43 03/12/2012 09:09 04/16/2012 09:36 05/14/2012 08:37  PSA Latest Range: <=4.00 ng/mL 83.07 (H) 63.83 (H) 58.41 (H) 62.21 (H)    Impression and Plan: This is a 72 year old gentleman with the following issues:   1. Castration-resistant prostate cancer. He is on Zytiga. He is tolerating it well. His PSA dropped from 83 to 58 but went up last month.  I Recommend that he continue the Zytiga at this time for at least the next two months.  2. Back pain and L3 involvement of his cancer.  This is much improved now. We can consider radiation in the future if needed.   3. Hormonal deprivation. This will continue with the Lupron. Due March 2014 at [facility].  4. Bone-directed therapy. I have recommended continue Xgeva at this time. He is receiving both Lupron and Xgeva at Dr. Vevelyn Royals office, and I told him that I will be happy to continue those here if he would like, but for the time being it does not really matter where he gets it as long as he is receiving both post treatments.   5. Insomnia. Better now.   6. followup. The patient will be seen back in 4 weeks time.   SHADAD,FIRAS 2/20/20149:53 AM

## 2012-06-19 NOTE — H&P (Signed)
History of Present Illness  Willie Gaines is a 72 year old with the following urologic history:  1) Metastatic prostate cancer: He is s/p a non-nerve sparing RAL radical prostectomy on January 01, 2008. His postoperative course was complicated by a urine leak which resolved with catheter drainage. His pathology demonstrated pT3b N0 Mx (16 nodes negative), Gleason 4+3=7 (tertiary pattern 5) adenocarcinoma with positive surgical margins at the seminal vesicles. His PSA initially became undetectable after surgery and he proceeded with adjuvant radiation therapy which he completed in June 2010. He developed a biochemical recurrence in October 2010. 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 He began androgen deprivation with Lupron in August 2011. 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 He enrolled in the STRIVE trial in May 2013 randomizing patients to enzalutimide vs. bicalutimide. He was noted to have progression of his bone metastases in September 2013 as well as a rapid increase in his PSA and came off study in early October 2013.  He then began treatment with abiraterone under the care of Dr. Clelia Croft in October 2013.  2) Left ureteral obstruction: This was diagnosed in August 2011 and it has been managed with ureteral stent drainage. Last stent: 01/28/12 (6 x 24)  3) Testosterone deficiency/osteopenia/CRPC with bone metastases: He takes Vitamin D and calcium supplementation for osteoporosis prevention. Current therapy: Lupron 30 mg (01/25/12), abiraterone July 2011: DXA (Osteopenia) Bone treatment: Xgeva 120 mg monthly  Interval history:  He follows up today for further evaluation of his ureteral  obstruction as well as his castrate resistant metastatic prostate cancer. He has been taking abiraterone under the care of Dr. Clelia Croft and has been tolerating this medication relatively well although has had some increased fatigue associated with it. His PSA apparently has declined from around 89 down to 58 and has stabilized. With regard to his urologic symptoms, he has had some increased problems related to urinary incontinence just over the past 2-3 weeks. Otherwise, his urinary frequency at baseline has been tolerable.     Past Medical History Problems  1. History of  Acute Pancreatitis 577.0 2. 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 Prostate Cancer 185  Surgical History Problems  1. History of  Appendectomy 2. History of  Cystoscopy With Insertion Of Ureteral Stent Left 3. History of  Cystoscopy With Insertion Of Ureteral Stent Left 4. History of  Cystoscopy With Insertion Of Ureteral Stent Left 5. History of  Cystoscopy With Insertion Of Ureteral Stent Left 6. History of  Cystoscopy With Insertion Of Ureteral Stent Left 7. History of  Cystoscopy With Insertion Of Ureteral Stent Left 8. History of  Cystoscopy With Insertion Of Ureteral Stent Left 9. History of  Ileostomy 10. History of  Ileostomy Closure 11. History of  Partial Colectomy 12. History of  Prostatect Retropubic Radical W/ Nerve Sparing Laparoscopic 13. History of  Rotator Cuff Repair Bilateral  Current Meds 1. Atorvastatin Calcium 10 MG Oral Tablet; Therapy: 28Jun2013 to 2. Calcium 600 TABS; Therapy: (Recorded:07Jun2013) to 3. Crestor 10 MG Oral Tablet; Therapy: (Recorded:17Apr2012) to 4. EQ Aspirin Low Dose 81 MG TABS; Therapy: (Recorded:17Apr2012) to 5. Iron TABS; Therapy: (Recorded:11Jun2013) to 6. Motrin 800 MG TABS; prn; Therapy: (Recorded:12Sep2013) to 7.  Multiple Vitamin TABS; Therapy: (Recorded:28Sep2011) to 8. Ondansetron 8 MG Oral Tablet Dispersible; Therapy: 12Oct2013  to 9. OxyCODONE HCl 5 MG Oral Tablet; Therapy: 27Sep2013 to 10. Promethazine HCl 25 MG Oral Tablet; Therapy: 12Oct2013 to 11. Vitamin D TABS; Therapy: (Recorded:16Jan2013) to 12. Zolpidem Tartrate 5 MG Oral Tablet; Therapy: 13Nov2013 to 13. ZzzQuil LIQD; Therapy: (Recorded:12Sep2013) to  Allergies Medication  1. No Known Drug Allergies  Family History Problems  1. Paternal history of  Adenocarcinoma Of The Pancreas 2. Maternal history of  Asthma V17.5 3. Fraternal history of  Prostate Cancer V16.42  Social History Problems  1. Alcohol Use 2. Former Smoker V15.82 quit smoking in 1981 3. Marital History - Currently Married 4. Occupation: retired Nurse, children's  5. History of  Tobacco Use  Review of Systems  Genitourinary: no dysuria and no hematuria.  Constitutional: no fever.    Vitals Vital Signs [Data Includes: Last 1 Day]  22Jan2014 11:06AM  BMI Calculated: 27.4 BSA Calculated: 2 Height: 5 ft 9 in Weight: 185 lb  Blood Pressure: 157 / 97 Heart Rate: 80  Physical Exam Constitutional: Well nourished and well developed . No acute distress.  Pulmonary: No respiratory distress and normal respiratory rhythm and effort.  Cardiovascular: Heart rate and rhythm are normal . No peripheral edema.  Abdomen: The abdomen is soft and nontender.  Neuro/Psych:. Mood and affect are appropriate.    Results/Data Urine [Data Includes: Last 1 Day]   22Jan2014  COLOR YELLOW   APPEARANCE CLEAR   SPECIFIC GRAVITY 1.025   pH 5.5   GLUCOSE NEG mg/dL  BILIRUBIN NEG   KETONE NEG mg/dL  BLOOD SMALL   PROTEIN TRACE mg/dL  UROBILINOGEN 0.2 mg/dL  NITRITE NEG   LEUKOCYTE ESTERASE MOD   SQUAMOUS EPITHELIAL/HPF RARE   WBC 21-50 WBC/hpf  RBC 3-6 RBC/hpf  BACTERIA MODERATE   CRYSTALS NONE SEEN   CASTS NONE SEEN     Urine has been cultured   Assessment Assessed  1. Prostate Cancer 185 2. Ureteral Obstruction 593.4 3. 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 UA With REFLEX  Done: 22Jan2014 10:56AM Metastasis Of Malignant Neoplasm To Bone (198.5)  2. Xgeva 120 MG/1.7ML Subcutaneous Solution; INJECT 120  MG Subcutaneous Monthly; Done:  22Jan2014 11:54AM; Status: COMPLETE 3. Follow-up Month x 4 Office  Follow-up  Requested for: 22Jan2014 4. Follow-up [place] Injection Office  Follow-up  Requested for: 22Jan2014 5. Follow-up Schedule Surgery Office  Follow-up  Done: 22Jan2014 6. BASIC METABOLIC PANEL  Requested for: 22Jan2014 Prostate Cancer (185)  7. Lupron Depot 30 MG Intramuscular Kit; INJECT 30 MG Intramuscular; Done: 22Jan2014  11:55AM; Status: COMPLETE  Discussion/Summary  1. Left ureteral obstruction: He will be scheduled for cystoscopy and left ureteral stent change in the near future. He will plan to followup in approximately 4 months in preparation for his next stent change. His urine has been cultured today in preparation for appropriate antibiotic treatment prior to his stent change.  2. Incontinence/urinary tract infection: The symptoms are slightly consistent with a urinary tract infection based on his urinalysis today. His urine has been cultured and he will be treated appropriately pending his culture results.  3. Bone metastases: He will be administered Xgeva 120 mg today and continue monthly treatments. A serum calcium level will be checked prior to his next visit in 4 months.  4. Metastatic castrate resistant prostate cancer: He received Lupron 30 mg today and will follow up in 4 months for his next  injection. He will continue followup with Dr. Clelia Croft as planned next month for ongoing treatment with abiraterone.  Cc: Dr. Eli Hose Dr. Margaretmary Dys Dr. Elias Else     Verified Results URINE CULTURE2 17Feb2014 10:15AM2 Randal Buba  SOURCE : CLEAN CATCH SPECIMEN TYPE: CLEAN CATCH  [Jun 19, 2012 7:03AM Ansel Ferrall] Notify patient of culture result. He should begin Cipro 500 mg po tonight and  will continue this medication for a few days after his procedure as well.   Test Name Result Flag Reference  CULTURE, URINE2 Culture, Urine2    ===== COLONY COUNT: =====  >=100,000 COLONIES/ML   FINAL REPORT: PROTEUS MIRABILIS   SENSITIVITY FOR: PROTEUS MIRABILIS    AMPICILLIN                             SENSITIVE        <=2    AMPICILLIN/SUL                         SENSITIVE        <=2    PIPERACILLIN/TAZO                      SENSITIVE        <=4    IMIPENEM                               INDETERMINATE      8    CEFAZOLIN                              SENSITIVE        <=4    CEFOXITIN                              SENSITIVE        <=4    CEFTRIAXONE                            SENSITIVE        <=1    CEFTAZIDIME                            SENSITIVE        <=1    CEFEPIME                               SENSITIVE        <=1    GENTAMICIN                             SENSITIVE        <=1    TOBRAMYCIN                             SENSITIVE        <=1    CIPROFLOXACIN                          SENSITIVE     <=0.25    LEVOFLOXACIN  SENSITIVE     <=0.12    NITROFURANTOIN                         RESISTANT        256    TRIMETH/SULFA                          SENSITIVE       <=20    END OF REPORT   URINE CULTURE1 22Jan2014 11:47AM1 Lyda Perone  Source : CLEAN CATCH SPECIMEN TYPE: CLEAN CATCH  [May 23, 2012 6:32AM Latoya Diskin] Please tell patient I have called in doxycycline 100 mg po bid for 1 week for him to begin taking. Thanks. Please schedule him for a drop off urine culture 5-7 days before his upcoming stent change. Thanks.   Test Name Result Flag Reference  CULTURE, URINE1 Culture, Urine1    ===== COLONY COUNT: =====  3,000 COLONIES/ML   FINAL REPORT: Insignificant Growth     1. Amended By: Heloise Purpura; 05/23/2012 6:32 AMEST  2. Amended By: Heloise Purpura; 06/19/2012 7:03 AMEST  Signatures Electronically signed by : Heloise Purpura, [name]; Jun 19 2012  7:03AM

## 2012-06-19 NOTE — Telephone Encounter (Signed)
Gave pt appt for March 2014 lab and ML, then MD on APril 2014 with lab

## 2012-06-20 ENCOUNTER — Encounter (HOSPITAL_COMMUNITY)
Admission: RE | Disposition: A | Discharge status: Home / Self Care | Payer: Self-pay | Source: Ambulatory Visit | Attending: Urology

## 2012-06-20 ENCOUNTER — Ambulatory Visit (HOSPITAL_COMMUNITY)
Admission: RE | Admit: 2012-06-20 | Discharge: 2012-06-20 | Disposition: A | Discharge status: Home / Self Care | Payer: Medicare Other | Source: Ambulatory Visit | Attending: Urology | Admitting: Urology

## 2012-06-20 ENCOUNTER — Encounter (HOSPITAL_COMMUNITY): Payer: Self-pay | Admitting: *Deleted

## 2012-06-20 ENCOUNTER — Ambulatory Visit (HOSPITAL_COMMUNITY): Payer: Medicare Other | Admitting: Anesthesiology

## 2012-06-20 ENCOUNTER — Other Ambulatory Visit: Payer: Self-pay | Admitting: *Deleted

## 2012-06-20 ENCOUNTER — Encounter (HOSPITAL_COMMUNITY): Payer: Self-pay | Admitting: Anesthesiology

## 2012-06-20 DIAGNOSIS — Z8042 Family history of malignant neoplasm of prostate: Secondary | ICD-10-CM | POA: Insufficient documentation

## 2012-06-20 DIAGNOSIS — E291 Testicular hypofunction: Secondary | ICD-10-CM | POA: Insufficient documentation

## 2012-06-20 DIAGNOSIS — Z800 Family history of malignant neoplasm of digestive organs: Secondary | ICD-10-CM | POA: Insufficient documentation

## 2012-06-20 DIAGNOSIS — R32 Unspecified urinary incontinence: Secondary | ICD-10-CM | POA: Insufficient documentation

## 2012-06-20 DIAGNOSIS — Z79899 Other long term (current) drug therapy: Secondary | ICD-10-CM | POA: Insufficient documentation

## 2012-06-20 DIAGNOSIS — Z01812 Encounter for preprocedural laboratory examination: Secondary | ICD-10-CM | POA: Insufficient documentation

## 2012-06-20 DIAGNOSIS — C61 Malignant neoplasm of prostate: Secondary | ICD-10-CM | POA: Insufficient documentation

## 2012-06-20 DIAGNOSIS — C7951 Secondary malignant neoplasm of bone: Secondary | ICD-10-CM | POA: Insufficient documentation

## 2012-06-20 DIAGNOSIS — N135 Crossing vessel and stricture of ureter without hydronephrosis: Secondary | ICD-10-CM | POA: Insufficient documentation

## 2012-06-20 DIAGNOSIS — Z9079 Acquired absence of other genital organ(s): Secondary | ICD-10-CM | POA: Insufficient documentation

## 2012-06-20 DIAGNOSIS — Z466 Encounter for fitting and adjustment of urinary device: Secondary | ICD-10-CM | POA: Insufficient documentation

## 2012-06-20 DIAGNOSIS — M899 Disorder of bone, unspecified: Secondary | ICD-10-CM | POA: Insufficient documentation

## 2012-06-20 HISTORY — PX: CYSTOSCOPY W/ URETERAL STENT PLACEMENT: SHX1429

## 2012-06-20 SURGERY — CYSTOSCOPY, FLEXIBLE, WITH STENT REPLACEMENT
Anesthesia: General | Laterality: Left | Wound class: Clean Contaminated

## 2012-06-20 MED ORDER — LIDOCAINE HCL (CARDIAC) 20 MG/ML IV SOLN
INTRAVENOUS | Status: DC | PRN
  Administered 2012-06-20: 50 mg via INTRAVENOUS

## 2012-06-20 MED ORDER — LIDOCAINE HCL 2 % EX GEL
CUTANEOUS | Status: DC | PRN
  Administered 2012-06-20: 1

## 2012-06-20 MED ORDER — MIDAZOLAM HCL 5 MG/5ML IJ SOLN
INTRAMUSCULAR | Status: DC | PRN
  Administered 2012-06-20: 2 mg via INTRAVENOUS

## 2012-06-20 MED ORDER — KETOROLAC TROMETHAMINE 30 MG/ML IJ SOLN: 15.0000 mg | Freq: Once | INTRAMUSCULAR | Status: DC | PRN

## 2012-06-20 MED ORDER — CIPROFLOXACIN IN D5W 400 MG/200ML IV SOLN
400.0000 mg | INTRAVENOUS | Status: AC
  Administered 2012-06-20: 400 mg via INTRAVENOUS

## 2012-06-20 MED ORDER — PROMETHAZINE HCL 25 MG/ML IJ SOLN: 6.2500 mg | INTRAMUSCULAR | Status: DC | PRN

## 2012-06-20 MED ORDER — OXYCODONE HCL 5 MG PO TABS: 5.0000 mg | ORAL_TABLET | Freq: Four times a day (QID) | ORAL | Status: DC | PRN

## 2012-06-20 MED ORDER — LACTATED RINGERS IV SOLN
INTRAVENOUS | Status: DC
  Administered 2012-06-20: 13:00:00 via INTRAVENOUS

## 2012-06-20 MED ORDER — EPHEDRINE SULFATE 50 MG/ML IJ SOLN
INTRAMUSCULAR | Status: DC | PRN
  Administered 2012-06-20: 5 mg via INTRAVENOUS

## 2012-06-20 MED ORDER — LIDOCAINE HCL 2 % EX GEL
CUTANEOUS | Status: AC
  Filled 2012-06-20: qty 10

## 2012-06-20 MED ORDER — STERILE WATER FOR IRRIGATION IR SOLN
Status: DC | PRN
  Administered 2012-06-20: 3000 mL

## 2012-06-20 MED ORDER — PHENAZOPYRIDINE HCL 100 MG PO TABS: 100.0000 mg | ORAL_TABLET | Freq: Three times a day (TID) | ORAL | Status: DC | PRN

## 2012-06-20 MED ORDER — FENTANYL CITRATE 0.05 MG/ML IJ SOLN: 25.0000 ug | INTRAMUSCULAR | Status: DC | PRN

## 2012-06-20 MED ORDER — CIPROFLOXACIN IN D5W 400 MG/200ML IV SOLN
INTRAVENOUS | Status: AC
  Filled 2012-06-20: qty 200

## 2012-06-20 MED ORDER — IOHEXOL 300 MG/ML  SOLN
INTRAMUSCULAR | Status: AC
  Filled 2012-06-20: qty 1

## 2012-06-20 MED ORDER — PROPOFOL 10 MG/ML IV EMUL
INTRAVENOUS | Status: DC | PRN
  Administered 2012-06-20: 200 mg via INTRAVENOUS

## 2012-06-20 MED ORDER — ONDANSETRON HCL 4 MG/2ML IJ SOLN
INTRAMUSCULAR | Status: DC | PRN
  Administered 2012-06-20: 4 mg via INTRAVENOUS

## 2012-06-20 MED ORDER — FENTANYL CITRATE 0.05 MG/ML IJ SOLN
INTRAMUSCULAR | Status: DC | PRN
  Administered 2012-06-20: 100 ug via INTRAVENOUS
  Administered 2012-06-20: 50 ug via INTRAVENOUS

## 2012-06-20 SURGICAL SUPPLY — 14 items
ADAPTER CATH URET PLST 4-6FR (CATHETERS) ×2 IMPLANT
BAG URO CATCHER STRL LF (DRAPE) ×2 IMPLANT
BASKET ZERO TIP NITINOL 2.4FR (BASKET) IMPLANT
CATH INTERMIT  6FR 70CM (CATHETERS) IMPLANT
CLOTH BEACON ORANGE TIMEOUT ST (SAFETY) ×2 IMPLANT
DRAPE CAMERA CLOSED 9X96 (DRAPES) ×2 IMPLANT
GLOVE BIOGEL M STRL SZ7.5 (GLOVE) ×6 IMPLANT
GOWN STRL NON-REIN LRG LVL3 (GOWN DISPOSABLE) ×4 IMPLANT
GUIDEWIRE ANG ZIPWIRE 038X150 (WIRE) IMPLANT
GUIDEWIRE STR DUAL SENSOR (WIRE) ×2 IMPLANT
MANIFOLD NEPTUNE II (INSTRUMENTS) ×2 IMPLANT
PACK CYSTO (CUSTOM PROCEDURE TRAY) ×2 IMPLANT
STENT CONTOUR 6FRX24X.038 (STENTS) ×2 IMPLANT
TUBING CONNECTING 10 (TUBING) ×2 IMPLANT

## 2012-06-20 NOTE — Anesthesia Postprocedure Evaluation (Signed)
Anesthesia Post Note  Patient: Willie Gaines  Procedure(s) Performed: Procedure(s) (LRB): CYSTOSCOPY WITH STENT REPLACEMENT (Left)  Anesthesia type: General  Patient location: PACU  Post pain: Pain level controlled  Post assessment: Post-op Vital signs reviewed  Last Vitals:  Filed Vitals:   06/20/12 1600  BP: 149/83  Pulse: 75  Temp: 36.8 C  Resp: 16    Post vital signs: Reviewed  Level of consciousness: sedated  Complications: No apparent anesthesia complications

## 2012-06-20 NOTE — Anesthesia Preprocedure Evaluation (Signed)
Anesthesia Evaluation  Patient identified by MRN, date of birth, ID band Patient awake    Reviewed: Allergy & Precautions, H&P , NPO status , Patient's Chart, lab work & pertinent test results  Airway Mallampati: II TM Distance: >3 FB Neck ROM: Full    Dental no notable dental hx.    Pulmonary neg pulmonary ROS,  breath sounds clear to auscultation  Pulmonary exam normal       Cardiovascular negative cardio ROS  Rhythm:Regular Rate:Normal     Neuro/Psych negative neurological ROS  negative psych ROS   GI/Hepatic negative GI ROS, Neg liver ROS,   Endo/Other  negative endocrine ROS  Renal/GU negative Renal ROS  negative genitourinary   Musculoskeletal negative musculoskeletal ROS (+)   Abdominal   Peds negative pediatric ROS (+)  Hematology negative hematology ROS (+)   Anesthesia Other Findings   Reproductive/Obstetrics negative OB ROS                          Anesthesia Physical Anesthesia Plan  ASA: II  Anesthesia Plan: General   Post-op Pain Management:    Induction: Intravenous  Airway Management Planned: LMA  Additional Equipment:   Intra-op Plan:   Post-operative Plan:   Informed Consent: I have reviewed the patients History and Physical, chart, labs and discussed the procedure including the risks, benefits and alternatives for the proposed anesthesia with the patient or authorized representative who has indicated his/her understanding and acceptance.   Dental advisory given  Plan Discussed with: CRNA and Surgeon  Anesthesia Plan Comments:         Anesthesia Quick Evaluation  

## 2012-06-20 NOTE — Op Note (Signed)
Preoperative diagnosis:  1. Left ureteral obstruction 2. Metastatic prostate cancer   Postoperative diagnosis:  1. Left ureteral obstruction 2. Metastatic prostate cancer   Procedure:  1. Cystoscopy 2. Left ureteral stent change (6 x 24)  Surgeon: Rolly Salter, Montez Hageman. [name]  Anesthesia: General  Complications: None  Intraoperative findings: The indwelling stent was noted to moderately encrusted distally.  EBL: Minimal  Specimens: None  Indication: Willie Gaines is a 72 y.o. patient with left ureteral obstruction. After reviewing the management options for treatment, he elected to proceed with the above surgical procedure(s). We have discussed the potential benefits and risks of the procedure, side effects of the proposed treatment, the likelihood of the patient achieving the goals of the procedure, and any potential problems that might occur during the procedure or recuperation. Informed consent has been obtained.  Description of procedure:  The patient was taken to the operating room and general anesthesia was induced.  The patient was placed in the dorsal lithotomy position, prepped and draped in the usual sterile fashion, and preoperative antibiotics were administered. A preoperative time-out was performed.   Cystourethroscopy was performed.  The patient's urethra was examined and was normal except for a mild stricture at the bladder neck which was easily navigated with the 22 Fr cystoscope sheath. The bladder was then systematically examined in its entirety. There was no evidence for any bladder tumors, stones, or other mucosal pathology.    Attention then turned to the left ureteral orifice and the patient's indwelling ureteral stent was identified and brought out to the urethral meatus with the flexible graspers.  A 0.38 sensor guidewire was then advanced up the left ureter into the renal pelvis under fluoroscopic guidance.  The wire was then backloaded through the cystoscope  and a ureteral stent was advance over the wire using Seldinger technique.  The stent was positioned appropriately under fluoroscopic and cystoscopic guidance.  The wire was then removed with an adequate stent curl noted in the renal pelvis as well as in the bladder.  The bladder was then emptied and the procedure ended.  The patient appeared to tolerate the procedure well and without complications.  The patient was able to be awakened and transferred to the recovery unit in satisfactory condition.    Moody Bruins MD

## 2012-06-20 NOTE — Preoperative (Signed)
Beta Blockers   Reason not to administer Beta Blockers:Not Applicable 

## 2012-06-20 NOTE — Interval H&P Note (Signed)
History and Physical Interval Note:  06/20/2012 1:33 PM  Willie Gaines  has presented today for surgery, with the diagnosis of LEFT URETERAL OBSTRUCTION  The various methods of treatment have been discussed with the patient and family. After consideration of risks, benefits and other options for treatment, the patient has consented to  Procedure(s): CYSTOSCOPY WITH STENT REPLACEMENT (Left) as a surgical intervention .  The patient's history has been reviewed, patient examined, no change in status, stable for surgery.  I have reviewed the patient's chart and labs.  Questions were answered to the patient's satisfaction.     Levander Katzenstein,LES

## 2012-06-20 NOTE — Progress Notes (Signed)
Patient states he is almost out of percocet. Dr Clelia Croft, oncologist, is managing his pain. Called Dr Alver Fisher RN and obtained new Rx for family. Wife to pick up Rx.

## 2012-06-20 NOTE — Transfer of Care (Signed)
Immediate Anesthesia Transfer of Care Note  Patient: Willie Gaines  Procedure(s) Performed: Procedure(s): CYSTOSCOPY WITH STENT REPLACEMENT (Left)  Patient Location: PACU  Anesthesia Type:General  Level of Consciousness: awake, alert , oriented and patient cooperative  Airway & Oxygen Therapy: Patient Spontanous Breathing and Patient connected to face mask oxygen  Post-op Assessment: Report given to PACU RN, Post -op Vital signs reviewed and stable and Patient moving all extremities  Post vital signs: Reviewed and stable  Complications: No apparent anesthesia complications

## 2012-06-23 ENCOUNTER — Encounter (HOSPITAL_COMMUNITY): Payer: Self-pay | Admitting: Urology

## 2012-06-23 NOTE — Telephone Encounter (Signed)
No note

## 2012-07-08 ENCOUNTER — Other Ambulatory Visit: Payer: Self-pay | Admitting: Oncology

## 2012-07-08 ENCOUNTER — Other Ambulatory Visit: Payer: Self-pay

## 2012-07-08 MED ORDER — ONDANSETRON HCL 8 MG PO TABS: 8.0000 mg | ORAL_TABLET | Freq: Two times a day (BID) | ORAL | Status: DC | PRN

## 2012-07-23 ENCOUNTER — Encounter: Payer: Self-pay | Admitting: Oncology

## 2012-07-23 ENCOUNTER — Ambulatory Visit (HOSPITAL_BASED_OUTPATIENT_CLINIC_OR_DEPARTMENT_OTHER): Payer: Medicare Other | Admitting: Oncology

## 2012-07-23 ENCOUNTER — Other Ambulatory Visit (HOSPITAL_BASED_OUTPATIENT_CLINIC_OR_DEPARTMENT_OTHER): Payer: Medicare Other | Admitting: Lab

## 2012-07-23 VITALS — BP 158/90 | HR 78 | Temp 98.0°F | Resp 20 | Ht 69.0 in | Wt 176.8 lb

## 2012-07-23 DIAGNOSIS — C61 Malignant neoplasm of prostate: Secondary | ICD-10-CM

## 2012-07-23 DIAGNOSIS — G4700 Insomnia, unspecified: Secondary | ICD-10-CM

## 2012-07-23 DIAGNOSIS — M549 Dorsalgia, unspecified: Secondary | ICD-10-CM

## 2012-07-23 DIAGNOSIS — C7951 Secondary malignant neoplasm of bone: Secondary | ICD-10-CM

## 2012-07-23 LAB — PSA: PSA: 82.53 ng/mL — ABNORMAL HIGH (ref <=4.00)

## 2012-07-23 LAB — COMPREHENSIVE METABOLIC PANEL (CC13)
BUN: 10.8 mg/dL (ref 7.0–26.0)
CO2: 23 mEq/L (ref 22–29)
Calcium: 8 mg/dL — ABNORMAL LOW (ref 8.4–10.4)
Chloride: 109 mEq/L — ABNORMAL HIGH (ref 98–107)
Creatinine: 1.1 mg/dL (ref 0.7–1.3)

## 2012-07-23 LAB — CBC WITH DIFFERENTIAL/PLATELET
Basophils Absolute: 0 10*3/uL (ref 0.0–0.1)
EOS%: 4.4 % (ref 0.0–7.0)
HCT: 36.7 % — ABNORMAL LOW (ref 38.4–49.9)
HGB: 11.8 g/dL — ABNORMAL LOW (ref 13.0–17.1)
LYMPH%: 31.5 % (ref 14.0–49.0)
MCH: 22 pg — ABNORMAL LOW (ref 27.2–33.4)
MONO#: 0.4 10*3/uL (ref 0.1–0.9)
NEUT%: 55.8 % (ref 39.0–75.0)
Platelets: 263 10*3/uL (ref 140–400)
lymph#: 1.6 10*3/uL (ref 0.9–3.3)

## 2012-07-23 NOTE — Progress Notes (Signed)
Hematology and Oncology Follow Up Visit  Willie Gaines [id number] 1940-08-16 72 y.o. 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 Diagnosed 2009. Gleason score 4+3 = 7 PSA 9.5  Prior Therapy: He underwent a radical prostatectomy on January 01, 2008, and the pathology from that, case number [id number], by Willie Gaines showed a prostatic adenocarcinoma, Gleason score of 4+3=7, involves both lobes. Tumor extends into the extracapsular tissue, involvement of the seminal vesicle. Lymph nodes were negative, and his pathological staging of T3b N0.  The patient did very well, did received adjuvant radiation therapy due to positive margins, and PSA was undetectable post surgery.  The patient developed biochemical relapse in October of 2010 July of 2011 and presented with metastatic disease and ureteral obstruction in August of 2011. The patient started with androgen deprivation at that time as well had a stent placement The patient did well initially with hormonal deprivation up until March 2013, where he developed castration-resistant disease, again with a bone scan showed  metastatic bony involvement. The patient was enrolled in the STRIVE trial, a randomized trial for enzalutamide versus bicalutamide and after  90 days of therapy, his PSA was up to 54 and a repeat bone scan on January 03, 2012, showed a progression of disease with increase widespread skeletal metastasis.   Current therapy: Zytiga 1000 mg daily started on 02/01/2012.   Interim History:  Willie Gaines presents today for a follow up visit. He temains on Zytiga and takes it in the am with better tolerance. He reports less fatigue now. He is able to exercise. His appetite is better at this time.His back pain is a lot better at this time. He is using Oxycodone rarly. He does not any genitourinary complaints. He is very active, has an excellent performance  status, does not report any deterioration in his health. No leg edema or any other symptoms. No new complaints related to Zytiga.    Medications: I have reviewed the patient's current medications. Current outpatient prescriptions:aspirin EC 81 MG tablet, Take 81 mg by mouth daily after breakfast., Disp: , Rfl: ;  atorvastatin (LIPITOR) 10 MG tablet, Take 10 mg by mouth at bedtime., Disp: , Rfl: ;  Calcium Citrate-Vitamin D (CITRACAL + D PO), Take 2 tablets by mouth daily., Disp: , Rfl: ;  denosumab (XGEVA) 120 MG/1.7ML SOLN, Inject 120 mg into the skin every 30 (thirty) days., Disp: , Rfl:  Leuprolide Acetate (LUPRON IJ), Inject as directed. EVERY 6 MONTHS-AT DR. BORDEN'S OFFICE, Disp: , Rfl: ;  ondansetron (ZOFRAN) 8 MG tablet, Take 1 tablet (8 mg total) by mouth every 12 (twelve) hours as needed for nausea., Disp: 20 tablet, Rfl: 1;  oxyCODONE (OXY IR/ROXICODONE) 5 MG immediate release tablet, Take 1-2 tablets (5-10 mg total) by mouth every 6 (six) hours as needed for pain. PAIN, Disp: 60 tablet, Rfl: 0 phenazopyridine (PYRIDIUM) 100 MG tablet, Take 1 tablet (100 mg total) by mouth 3 (three) times daily as needed for pain (for burning)., Disp: 10 tablet, Rfl: 0;  zolpidem (AMBIEN) 5 MG tablet, Take 1 tablet (5 mg total) by mouth at bedtime as needed for sleep., Disp: 30 tablet, Rfl: 1;  ZYTIGA 250 MG tablet, TAKE 4 TABLETS BY MOUTH ONCE DAILY. TAKE ON EMPTY STOMACH 1 HOUR BEFORE OR 2 HOURS AFTER A MEAL., Disp: 120 tablet, Rfl: 0  Allergies:  Allergies  Allergen Reactions  . Other     SOFT SHELL  CRAB-HIVES ALL OTHER    Past Medical History, Surgical history, Social history, and Family History were reviewed and updated.  Review of Systems: Constitutional:  Negative for fever, chills, night sweats, anorexia, weight loss, pain. Cardiovascular: no chest pain or dyspnea on exertion Respiratory: negative Neurological: negative Dermatological: negative ENT: negative Skin: Negative. Gastrointestinal:  negative Genito-Urinary: negative Hematological and Lymphatic: negative Breast: negative Musculoskeletal: negative Remaining ROS negative.  Physical Exam: Blood pressure 158/90, pulse 78, temperature 98 F (36.7 C), temperature source Oral, resp. rate 20, height 5\' 9"  (1.753 m), weight 176 lb 12.8 oz (80.196 kg). ECOG: 0 General appearance: alert Head: Normocephalic, without obvious abnormality, atraumatic Neck: no adenopathy, no carotid bruit, no JVD, supple, symmetrical, trachea midline and thyroid not enlarged, symmetric, no tenderness/mass/nodules Lymph nodes: Cervical, supraclavicular, and axillary nodes normal. Heart:regular rate and rhythm, S1, S2 normal, no murmur, click, rub or gallop Lung:chest clear, no wheezing, rales, normal symmetric air entry Abdomen: soft, non-tender, without masses or organomegaly EXT:no erythema, induration, or nodules   Lab Results: Lab Results  Component Value Date   WBC 5.1 07/23/2012   HGB 11.8* 07/23/2012   HCT 36.7* 07/23/2012   MCV 68.2* 07/23/2012   PLT 263 07/23/2012     Chemistry      Component Value Date/Time   NA 140 07/23/2012 0927   NA 139 06/16/2012 0920   K 3.6 07/23/2012 0927   K 3.9 06/16/2012 0920   CL 109* 07/23/2012 0927   CL 106 06/16/2012 0920   CO2 23 07/23/2012 0927   CO2 24 06/16/2012 0920   BUN 10.8 07/23/2012 0927   BUN 8 06/16/2012 0920   CREATININE 1.1 07/23/2012 0927   CREATININE 1.10 06/16/2012 0920      Component Value Date/Time   CALCIUM 8.0* 07/23/2012 0927   CALCIUM 7.6* 06/16/2012 0920   ALKPHOS 268* 07/23/2012 0927   ALKPHOS 215* 02/09/2012 0935   AST 15 07/23/2012 0927   AST 18 02/09/2012 0935   ALT 8 07/23/2012 0927   ALT 9 02/09/2012 0935   BILITOT 0.46 07/23/2012 0927   BILITOT 0.3 02/09/2012 0935     Results for Willie Gaines, Willie Gaines (MRN [id number]) as of 07/23/2012 09:42  Ref. Range 02/08/2012 08:43 03/12/2012 09:09 04/16/2012 09:36 05/14/2012 08:37 06/19/2012 09:23  PSA Latest Range: <=4.00 ng/mL 83.07 (H)  63.83 (H) 58.41 (H) 62.21 (H) 63.01 (H)    Impression and Plan: This is a 72 year old gentleman with the following issues:   1. Castration-resistant prostate cancer. He is on Zytiga. He is tolerating it well. His PSA dropped from 83 to 58 but has gone up slightly. I Recommend that he continue the Zytiga at this time for at least the next month.  2. Back pain and L3 involvement of his cancer.  This is much improved now. We can consider radiation in the future if needed.   3. Hormonal deprivation. He continues to be on Lupron at [facility].   4. Bone-directed therapy. I have recommended continue Xgeva at this time. He is receiving both Lupron and Xgeva at Dr. Vevelyn Royals office, and I told him that I will be happy to continue those here if he would like, but for the time being it does not really matter where he gets it as long as he is receiving both post treatments.   5. Insomnia. Better now.   6. followup. The patient will be seen back in 4-5 weeks time.   Clenton Pare 3/26/201412:09 PM

## 2012-07-23 NOTE — Patient Instructions (Addendum)
Results for Willie Gaines, Willie Gaines (MRN [id number]) as of 07/23/2012 09:42  Ref. Range 02/08/2012 08:43 03/12/2012 09:09 04/16/2012 09:36 05/14/2012 08:37 06/19/2012 09:23  PSA Latest Range: <=4.00 ng/mL 83.07 (H) 63.83 (H) 58.41 (H) 62.21 (H) 63.01 (H)

## 2012-08-11 ENCOUNTER — Other Ambulatory Visit: Payer: Self-pay | Admitting: Oncology

## 2012-08-18 ENCOUNTER — Other Ambulatory Visit: Payer: Self-pay | Admitting: Oncology

## 2012-08-18 ENCOUNTER — Encounter: Payer: Self-pay | Admitting: Oncology

## 2012-08-18 ENCOUNTER — Telehealth: Payer: Self-pay | Admitting: *Deleted

## 2012-08-18 MED ORDER — FENTANYL 25 MCG/HR TD PT72: 1.0000 | MEDICATED_PATCH | TRANSDERMAL | Status: DC

## 2012-08-18 MED ORDER — OXYCODONE HCL 5 MG PO TABS: 5.0000 mg | ORAL_TABLET | ORAL | Status: DC | PRN

## 2012-08-18 NOTE — Telephone Encounter (Signed)
Wife calling to say patient is having increased pain in lower back and right shoulder., he is taking oxycodone 5 mg -   2 tablet every 6 hours without relief. Per kristin, fentanyl patch 25 mcg ordered for every 72 hours and increase dose of oxycodone 5 mg to 1-2 tablets every 4 hours prn pain. F/u with dr Clelia Croft at regularly scheduled appt on 08/21/12 for futher evaluation. Wife coming to cancer center now to p/u scripts.

## 2012-08-21 ENCOUNTER — Telehealth: Payer: Self-pay | Admitting: Oncology

## 2012-08-21 ENCOUNTER — Other Ambulatory Visit (HOSPITAL_BASED_OUTPATIENT_CLINIC_OR_DEPARTMENT_OTHER): Payer: Medicare Other | Admitting: Lab

## 2012-08-21 ENCOUNTER — Ambulatory Visit (HOSPITAL_BASED_OUTPATIENT_CLINIC_OR_DEPARTMENT_OTHER): Payer: Medicare Other | Admitting: Oncology

## 2012-08-21 VITALS — BP 140/81 | HR 98 | Temp 98.4°F | Resp 18 | Ht 69.0 in | Wt 174.6 lb

## 2012-08-21 DIAGNOSIS — M549 Dorsalgia, unspecified: Secondary | ICD-10-CM

## 2012-08-21 DIAGNOSIS — R350 Frequency of micturition: Secondary | ICD-10-CM

## 2012-08-21 DIAGNOSIS — C61 Malignant neoplasm of prostate: Secondary | ICD-10-CM

## 2012-08-21 DIAGNOSIS — C7951 Secondary malignant neoplasm of bone: Secondary | ICD-10-CM

## 2012-08-21 DIAGNOSIS — C7952 Secondary malignant neoplasm of bone marrow: Secondary | ICD-10-CM

## 2012-08-21 LAB — CBC WITH DIFFERENTIAL/PLATELET
Basophils Absolute: 0 10*3/uL (ref 0.0–0.1)
Eosinophils Absolute: 0.2 10*3/uL (ref 0.0–0.5)
HGB: 12 g/dL — ABNORMAL LOW (ref 13.0–17.1)
MONO#: 0.5 10*3/uL (ref 0.1–0.9)
NEUT#: 3.4 10*3/uL (ref 1.5–6.5)
RDW: 16.1 % — ABNORMAL HIGH (ref 11.0–14.6)
lymph#: 1.2 10*3/uL (ref 0.9–3.3)

## 2012-08-21 LAB — COMPREHENSIVE METABOLIC PANEL (CC13)
AST: 13 U/L (ref 5–34)
Albumin: 2.9 g/dL — ABNORMAL LOW (ref 3.5–5.0)
BUN: 11.4 mg/dL (ref 7.0–26.0)
CO2: 23 mEq/L (ref 22–29)
Calcium: 7.9 mg/dL — ABNORMAL LOW (ref 8.4–10.4)
Chloride: 100 mEq/L (ref 98–107)
Glucose: 127 mg/dl — ABNORMAL HIGH (ref 70–99)
Potassium: 3.4 mEq/L — ABNORMAL LOW (ref 3.5–5.1)

## 2012-08-21 MED ORDER — CIPROFLOXACIN HCL 500 MG PO TABS: 500.0000 mg | ORAL_TABLET | Freq: Two times a day (BID) | ORAL | Status: DC

## 2012-08-21 NOTE — Progress Notes (Signed)
Hematology and Oncology Follow Up Visit  Willie Gaines [id number] 11-26-1940 72 y.o. 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 Diagnosed 2009. Gleason score 4+3 = 7 PSA 9.5  Prior Therapy: He underwent a radical prostatectomy on January 01, 2008, and the pathology from that, case number [id number], by Dr. Laverle Patter showed a prostatic adenocarcinoma, Gleason score of 4+3=7, involves both lobes. Tumor extends into the extracapsular tissue, involvement of the seminal vesicle. Lymph nodes were negative, and his pathological staging of T3b N0.  The patient did very well, did received adjuvant radiation therapy due to positive margins, and PSA was undetectable post surgery.  The patient developed biochemical relapse in October of 2010 July of 2011 and presented with metastatic disease and ureteral obstruction in August of 2011. The patient started with androgen deprivation at that time as well had a stent placement The patient did well initially with hormonal deprivation up until March 2013, where he developed castration-resistant disease, again with a bone scan showed  metastatic bony involvement. The patient was enrolled in the STRIVE trial, a randomized trial for enzalutamide versus bicalutamide and after  90 days of therapy, his PSA was up to 54 and a repeat bone scan on January 03, 2012, showed a progression of disease with increase widespread skeletal metastasis.   Current therapy: Zytiga 1000 mg daily started on 02/01/2012.   Interim History:  Willie Gaines presents today for a follow up visit. He temains on Zytiga and takes it in the am with better tolerance. He reported increase in his pain levels last week and was started on Fentanyl patch with improvement in his pain. His pain is described as mid back and shoulder (now graded 2-3/10) NO radiation of pain to his legs. No neurological symptoms. He did report  episodes of nausea and vomiting but has resolved now. He reports less fatigue now. His appetite is better at this time. No leg edema or any other symptoms. No new complaints related to Zytiga.   He is reporting some dysuria as well.   Medications: I have reviewed the patient's current medications. Current outpatient prescriptions:aspirin EC 81 MG tablet, Take 81 mg by mouth daily after breakfast., Disp: , Rfl: ;  atorvastatin (LIPITOR) 10 MG tablet, Take 10 mg by mouth at bedtime., Disp: , Rfl: ;  Calcium Citrate-Vitamin D (CITRACAL + D PO), Take 2 tablets by mouth daily., Disp: , Rfl: ;  denosumab (XGEVA) 120 MG/1.7ML SOLN, Inject 120 mg into the skin every 30 (thirty) days., Disp: , Rfl:  fentaNYL (DURAGESIC - DOSED MCG/HR) 25 MCG/HR, Place 1 patch (25 mcg total) onto the skin every 3 (three) days., Disp: 10 patch, Rfl: 0;  Leuprolide Acetate (LUPRON IJ), Inject as directed. EVERY 6 MONTHS-AT DR. BORDEN'S OFFICE, Disp: , Rfl: ;  ondansetron (ZOFRAN) 8 MG tablet, Take 1 tablet (8 mg total) by mouth every 12 (twelve) hours as needed for nausea., Disp: 20 tablet, Rfl: 1 oxyCODONE (OXY IR/ROXICODONE) 5 MG immediate release tablet, Take 1-2 tablets (5-10 mg total) by mouth every 4 (four) hours as needed for pain. PAIN, Disp: 60 tablet, Rfl: 0;  phenazopyridine (PYRIDIUM) 100 MG tablet, Take 1 tablet (100 mg total) by mouth 3 (three) times daily as needed for pain (for burning)., Disp: 10 tablet, Rfl: 0 zolpidem (AMBIEN) 5 MG tablet, Take 1 tablet (5 mg total) by mouth at bedtime as needed for sleep., Disp: 30 tablet, Rfl: 1;  ZYTIGA 250 MG tablet, TAKE 4 TABLETS BY MOUTH ONCE DAILY. TAKE ON EMPTY STOMACH 1 HOUR BEFORE OR 2 HOURS AFTER A MEAL., Disp: 120 tablet, Rfl: 0;  ciprofloxacin (CIPRO) 500 MG tablet, Take 1 tablet (500 mg total) by mouth 2 (two) times daily., Disp: 14 tablet, Rfl: 0  Allergies:  Allergies  Allergen Reactions  . Other     SOFT SHELL CRAB-HIVES ALL OTHER    Past Medical History,  Surgical history, Social history, and Family History were reviewed and updated.  Review of Systems: Constitutional:  Negative for fever, chills, night sweats, anorexia, weight loss, pain. Cardiovascular: no chest pain or dyspnea on exertion Respiratory: negative Neurological: negative Dermatological: negative ENT: negative Skin: Negative. Gastrointestinal: negative Genito-Urinary: negative Hematological and Lymphatic: negative Breast: negative Musculoskeletal: negative Remaining ROS negative.  Physical Exam: Blood pressure 140/81, pulse 98, temperature 98.4 F (36.9 C), temperature source Oral, resp. rate 18, height 5\' 9"  (1.753 m), weight 174 lb 9.6 oz (79.198 kg). ECOG: 0 General appearance: alert Head: Normocephalic, without obvious abnormality, atraumatic Neck: no adenopathy, no carotid bruit, no JVD, supple, symmetrical, trachea midline and thyroid not enlarged, symmetric, no tenderness/mass/nodules Lymph nodes: Cervical, supraclavicular, and axillary nodes normal. Heart:regular rate and rhythm, S1, S2 normal, no murmur, click, rub or gallop Lung:chest clear, no wheezing, rales, normal symmetric air entry Abdomen: soft, non-tender, without masses or organomegaly EXT:no erythema, induration, or nodules   Lab Results: Lab Results  Component Value Date   WBC 5.3 08/21/2012   HGB 12.0* 08/21/2012   HCT 37.8* 08/21/2012   MCV 68.5* 08/21/2012   PLT 287 08/21/2012     Chemistry      Component Value Date/Time   NA 140 07/23/2012 0927   NA 139 06/16/2012 0920   K 3.6 07/23/2012 0927   K 3.9 06/16/2012 0920   CL 109* 07/23/2012 0927   CL 106 06/16/2012 0920   CO2 23 07/23/2012 0927   CO2 24 06/16/2012 0920   BUN 10.8 07/23/2012 0927   BUN 8 06/16/2012 0920   CREATININE 1.1 07/23/2012 0927   CREATININE 1.10 06/16/2012 0920      Component Value Date/Time   CALCIUM 8.0* 07/23/2012 0927   CALCIUM 7.6* 06/16/2012 0920   ALKPHOS 268* 07/23/2012 0927   ALKPHOS 215* 02/09/2012 0935   AST  15 07/23/2012 0927   AST 18 02/09/2012 0935   ALT 8 07/23/2012 0927   ALT 9 02/09/2012 0935   BILITOT 0.46 07/23/2012 0927   BILITOT 0.3 02/09/2012 0935     Results for Willie Gaines, Willie Gaines (MRN [id number]) as of 08/21/2012 09:30  Ref. Range 06/19/2012 09:23 07/23/2012 09:27  PSA Latest Range: <=4.00 ng/mL 63.01 (H) 82.53 (H)     Impression and Plan: This is a 72 year old gentleman with the following issues:   1. Castration-resistant prostate cancer. He is on Zytiga. He is tolerating it well. His PSA dropped from 83 to 58 but has gone up to 82.53 I Recommend that he continue the Zytiga at this time. I will restage him with CT scan and bone scan and depending on these findings will dictate treatment. If he has bone disease only, he might be a candidate for Xofigo. If he developed visceral mets, then chemotherapy will the way to go.   2. Back pain and L3 involvement of his cancer. I will refer him to radiation oncology for an evaluation for possible palliative radiation to that area.   3. Hormonal deprivation. He continues to be on Lupron at [facility].   4. Bone-directed therapy. I have recommended continue Xgeva at this time. He is receiving both Lupron and Xgeva at Dr. Vevelyn Royals office, and I told him that I will be happy to continue those here if he would like, but for the time being it does not really matter where he gets it as long as he is receiving both post treatments.   5. Back pain: He is on Fentanyl patch, better controlled now.  6. Dysuria and possible UTI: Cipro given today.   7. Follow up: in 3-4 weeks after studies and evaluation has been complete.    Avelardo Reesman 4/24/20149:57 AM

## 2012-08-22 ENCOUNTER — Telehealth: Payer: Self-pay | Admitting: *Deleted

## 2012-08-22 NOTE — Telephone Encounter (Signed)
Spoke with wife, gave results of PSA done on 08/21/12.

## 2012-08-22 NOTE — Telephone Encounter (Signed)
Message copied by Reesa Chew on Fri Aug 22, 2012  3:55 PM ------      Message from: Benjiman Core      Created: Fri Aug 22, 2012  8:15 AM       Please call his PSA. He knows it is going up. Stay on the current medicine. ------

## 2012-08-28 ENCOUNTER — Encounter: Payer: Self-pay | Admitting: *Deleted

## 2012-08-28 ENCOUNTER — Telehealth: Payer: Self-pay | Admitting: *Deleted

## 2012-08-28 ENCOUNTER — Ambulatory Visit
Admission: RE | Admit: 2012-08-28 | Discharge: 2012-08-28 | Disposition: A | Discharge status: Home / Self Care | Payer: Medicare Other | Source: Ambulatory Visit | Attending: Radiation Oncology | Admitting: Radiation Oncology

## 2012-08-28 ENCOUNTER — Encounter: Payer: Self-pay | Admitting: Radiation Oncology

## 2012-08-28 ENCOUNTER — Ambulatory Visit: Admission: RE | Admit: 2012-08-28 | Payer: Medicare Other | Source: Ambulatory Visit

## 2012-08-28 DIAGNOSIS — Z510 Encounter for antineoplastic radiation therapy: Secondary | ICD-10-CM | POA: Insufficient documentation

## 2012-08-28 DIAGNOSIS — C7952 Secondary malignant neoplasm of bone marrow: Secondary | ICD-10-CM | POA: Insufficient documentation

## 2012-08-28 DIAGNOSIS — C61 Malignant neoplasm of prostate: Secondary | ICD-10-CM

## 2012-08-28 DIAGNOSIS — C7951 Secondary malignant neoplasm of bone: Secondary | ICD-10-CM | POA: Insufficient documentation

## 2012-08-28 DIAGNOSIS — C7931 Secondary malignant neoplasm of brain: Secondary | ICD-10-CM

## 2012-08-28 DIAGNOSIS — C7949 Secondary malignant neoplasm of other parts of nervous system: Secondary | ICD-10-CM

## 2012-08-28 NOTE — Progress Notes (Signed)
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  Interval Since Last Radiation:  4 years  Narrative:  The patient returns today for re-evaluation. To review, he is status post radical prostatectomy with PSA of 0.06 and high risk features (positive margin, extracapsular extension, seminal vesicle involvement) s/p adjuvant radiotherapy from August 16, 2008 through October 02, 2008 to the prostatic fossa tof 68.4 Gy in 38 fractions of 1.8 Gy.  The patient developed biochemical relapse in October of 2010 and did not follow up due to multiple abdominal surgeries due to tubulovillous adenoma of the colon. His PSA at that time had increased in July of 2011 and presented with metastatic disease and ureteral obstruction in August of 2011. The patient started with androgen deprivation at that time as well had a stent placement. The patient did well initially with hormonal deprivation up until March 2013, where he developed castration-resistant disease, again with a bone scan showed metastatic bony involvement. The patient was enrolled in the STRIVE trial, a randomized trial for enzalutamide versus bicalutamide and after 90 days of therapy, his PSA was up to 54 and a repeat bone scan on January 03, 2012, showed a progression of disease with increase widespread skeletal metastasis. There are new lesions in the posterior and anterior ribs and increased intensity previously,described. He has also developed back pain and had an MRI of the lumbar spine done on 01/11/2012 that showed osseous metastasis involving the lumbar spine, most prominent at L3, with some anterior epidural tumor,extension at that time. The patient was prescribed hydrocodone  and felt very well after taking it and really has very little back pain at this point. He was referred to me for evaluation for further therapy. Clinically, as mentioned, he does report some lower back pain, at times shooting pain down the leg. He does not any genitourinary complaints. He is very active, has an excellent performance status, does not report any deterioration in his health.  ALLERGIES:  is allergic to other.  Meds: Current Outpatient Prescriptions  Medication Sig Dispense Refill  . aspirin EC 81 MG tablet Take 81 mg by mouth daily after breakfast.      . atorvastatin (LIPITOR) 10 MG tablet Take 10 mg by mouth at bedtime.      . Calcium Citrate-Vitamin D (CITRACAL + D PO) Take 2 tablets by mouth daily.      . ciprofloxacin (CIPRO) 500 MG tablet Take 1 tablet (500 mg total) by mouth 2 (two) times daily.  14 tablet  0  . denosumab (XGEVA) 120 MG/1.7ML SOLN Inject 120 mg into the skin every 30 (thirty) days.      . fentaNYL (DURAGESIC - DOSED MCG/HR) 25 MCG/HR Place 1 patch (25 mcg total) onto the skin every 3 (three) days.  10 patch  0  . Leuprolide Acetate (LUPRON IJ) Inject as directed. EVERY 6 MONTHS-AT DR. BORDEN'S OFFICE      . ondansetron (ZOFRAN) 8 MG tablet Take 1 tablet (8 mg total) by mouth every 12 (twelve) hours as needed for nausea.  20 tablet  1  . oxyCODONE (OXY IR/ROXICODONE) 5 MG immediate release tablet Take 1-2 tablets (5-10 mg total) by mouth every 4 (four) hours as needed for pain.  PAIN  60 tablet  0  . phenazopyridine (PYRIDIUM) 100 MG tablet Take 1 tablet (100 mg total) by mouth 3 (three) times daily as needed for pain (for burning).  10 tablet  0  . zolpidem (AMBIEN) 5 MG tablet Take 1 tablet (5 mg total) by mouth at bedtime as needed for sleep.  30 tablet  1  . ZYTIGA 250 MG tablet TAKE 4 TABLETS BY MOUTH ONCE DAILY. TAKE ON EMPTY STOMACH 1 HOUR BEFORE OR 2 HOURS AFTER A MEAL.  120 tablet  0   No current facility-administered medications for this encounter.     Physical Findings: The patient is in no acute distress. Patient is alert and oriented.  vitals were not taken for this visit..  No significant changes.  Impression:  The patient has a painful L3 metastasis and may benefit from palliative radiation for pain control and prevention of neurologic injury.  Plan:  Today, I talked to the patient and family about the findings and work-up thus far.  We discussed the natural history of disease and general treatment, highlighting the role or radiotherapy in the management.  We discussed the available radiation techniques, and focused on the details of logistics and delivery.  We reviewed the anticipated acute and late sequelae associated with radiation in this setting.  The patient was encouraged to ask questions that I answered to the best of my ability.  I filled out a patient counseling form during our discussion including treatment diagrams.  We retained a copy for our records.  The patient would like to proceed with radiation and will be scheduled for CT simulation.  I spent 60 minutes minutes face to face with the patient and more than 50% of that time was spent in counseling and/or coordination of care.   _____________________________________  Artist Pais. Kathrynn Running, [name]

## 2012-08-28 NOTE — Progress Notes (Signed)
Postponed simulation

## 2012-08-28 NOTE — Progress Notes (Signed)
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   Interval Since Last Radiation: 4 years   Narrative: The patient returns today for re-evaluation. To review, he is status post radical prostatectomy with PSA of 0.06 and high risk features (positive margin, extracapsular extension, seminal vesicle involvement) s/p adjuvant radiotherapy from August 16, 2008 through October 02, 2008 to the prostatic fossa tof 68.4 Gy in 38 fractions of 1.8 Gy. The patient developed biochemical relapse in October of 2010 and did not follow up due to multiple abdominal surgeries due to tubulovillous adenoma of the colon. His PSA at that time had increased in July of 2011 and presented with metastatic disease and ureteral obstruction in August of 2011. The patient started with androgen deprivation at that time as well had a stent placement. The patient did well initially with hormonal deprivation up until March 2013, where he developed castration-resistant disease, again with a bone scan showed metastatic bony involvement. The patient was enrolled in the STRIVE trial, a randomized trial for enzalutamide versus bicalutamide and after 90 days of therapy, his PSA was up to 54 and a repeat bone scan on January 03, 2012, showed a progression of disease with increase widespread skeletal metastasis. There are new lesions in the posterior and anterior ribs and increased intensity previously,described. He had also developed back pain and had an MRI of the lumbar spine done on 01/11/2012 that showed osseous metastasis involving the lumbar spine, most prominent at L3, with some anterior epidural tumor,extension at that time. He was started on Zytiga with an intially  favorable PSA decrease.  Now, his PSA has been rising.  The patient was prescribed duragesic and felt very well after taking it and really has very little back pain at this point. He was referred to me for evaluation for further therapy. Clinically, as mentioned, he does report some lower back pain, at times shooting pain down the leg. He does not any genitourinary complaints. He is very active, has an excellent performance status, does not report any deterioration in his health.   ALLERGIES: is allergic to other.   Meds:  Current Outpatient Prescriptions   Medication  Sig  Dispense  Refill   .  aspirin EC 81 MG tablet  Take 81 mg by mouth daily after breakfast.     .  atorvastatin (LIPITOR) 10 MG tablet  Take 10 mg by mouth at bedtime.     .  Calcium Citrate-Vitamin D (CITRACAL + D PO)  Take 2 tablets by mouth daily.     .  ciprofloxacin (CIPRO) 500 MG tablet  Take 1 tablet (500 mg total) by mouth 2 (two) times daily.  14 tablet  0   .  denosumab (XGEVA) 120 MG/1.7ML SOLN  Inject 120 mg into the skin every 30 (thirty) days.     .  fentaNYL (DURAGESIC - DOSED MCG/HR) 25 MCG/HR  Place 1 patch (25 mcg total) onto the skin every 3 (three) days.  10 patch  0   .  Leuprolide Acetate (LUPRON IJ)  Inject as directed. EVERY 6 MONTHS-AT DR. BORDEN'S OFFICE     .  ondansetron (ZOFRAN) 8 MG tablet  Take 1 tablet (8 mg total) by mouth every 12 (twelve)  hours as needed for nausea.  20 tablet  1   .  oxyCODONE (OXY IR/ROXICODONE) 5 MG immediate release tablet  Take 1-2 tablets (5-10 mg total) by mouth every 4 (four) hours as needed for pain. PAIN  60 tablet  0   .  phenazopyridine (PYRIDIUM) 100 MG tablet  Take 1 tablet (100 mg total) by mouth 3 (three) times daily as needed for pain (for burning).  10 tablet  0   .  zolpidem (AMBIEN) 5 MG tablet  Take 1 tablet (5 mg total) by mouth at bedtime as needed for sleep.  30 tablet  1   .  ZYTIGA 250 MG tablet  TAKE 4 TABLETS BY MOUTH ONCE DAILY. TAKE ON EMPTY STOMACH  1 HOUR BEFORE OR 2 HOURS AFTER A MEAL.  120 tablet  0    No current facility-administered medications for this encounter.     Physical Findings:  The patient is in no acute distress. Patient is alert and oriented.  Speech articulate, gait unremarkable. No significant changes.   Impression: The patient has a painful L3 metastasis and may benefit from palliative radiation for pain control and prevention of neurologic injury. He also has some shoulder pain and leg pain.  He has upcoming re-staging including CT Chest/Abd/Pelvis and bone scan next Tuesday.  Since his pain is currently well-controlled with duragesic and he has no neurologic symptoms, we will await the results of his re-imaging and see him on 5/8 to discuss palliative XRT to L2, and any other appropriate sites.  We may also discuss Xofigo if clinically indicated.  Plan: Today, I talked to the patient and family about the findings and work-up thus far. We discussed the natural history of disease and general treatment, highlighting the role or radiotherapy in the management. We discussed the available radiation techniques, and focused on the details of logistics and delivery. We reviewed the anticipated acute and late sequelae associated with radiation in this setting. The patient was encouraged to ask questions that I answered to the best of my ability.  The patient will return to coordinate therapy next Thursday. _____________________________________  Artist Pais. Kathrynn Running, [name]

## 2012-08-28 NOTE — Progress Notes (Signed)
No show

## 2012-08-28 NOTE — Telephone Encounter (Signed)
Called patient cell phone same as home , no answer let ring 25 times, partient hasnt arrived for his 830 am appt ,will try again later today 9:07 AM

## 2012-08-28 NOTE — Progress Notes (Addendum)
Histology and Location of Primary Cancer Prostate progression of disease widespread  metastatic ,, repeat bone scan 01/03/12 Sites of Visceral and Bony Metastatic Disease: Location(s) of Symptomatic Metastases: L3 Past/Anticipated chemotherapy by medical oncology, if any: enrolled in a Strive trial,randomized for enzalutamide vs bicalutamide,after 90 days therapy PSA up 54,, may be candidate for Xofigo, taking xgeva monthly Pain on a scale of 0-10 is: no  If Spine Met(s), symptoms, if any, include:L3    Bowel/Bladder retention or incontinence (please describe):no  Numbness or weakness in extremities (please describe): no  Current Decadron regimen, if applicable: no  Ambulatory status? Walker? Wheelchair?: ambulatory  SAFETY ISSUES:  Prior radiation? Yes  ,08/17/18-10/02/08 castration resistant  Prostate  68.4 Gy/38 fx of 1.8Gy,gleason 4+3,PSA=9.51  Pacemaker/ICD? No  Possible current pregnancy? N/A Is the patient on methotrexate?NO Current Complaints / other details:, Mid Back and shoulder  Pain, on Fentanyl patch,   Father deceased 72 pancreatic ca,2 brothers prostate ca, married, 8 children, , NKDA,Curent therapy= Zytiga & Lupron, dysusria,some fatigue Nocturia 2-3x , just completed Cipro for UTI

## 2012-08-29 ENCOUNTER — Ambulatory Visit
Admission: RE | Admit: 2012-08-29 | Discharge: 2012-08-29 | Disposition: A | Discharge status: Home / Self Care | Payer: Medicare Other | Source: Ambulatory Visit | Attending: Radiation Oncology | Admitting: Radiation Oncology

## 2012-08-29 DIAGNOSIS — C7931 Secondary malignant neoplasm of brain: Secondary | ICD-10-CM

## 2012-08-29 DIAGNOSIS — M545 Low back pain, unspecified: Secondary | ICD-10-CM | POA: Insufficient documentation

## 2012-08-29 DIAGNOSIS — Z923 Personal history of irradiation: Secondary | ICD-10-CM | POA: Insufficient documentation

## 2012-08-29 DIAGNOSIS — M79609 Pain in unspecified limb: Secondary | ICD-10-CM | POA: Insufficient documentation

## 2012-08-29 DIAGNOSIS — C61 Malignant neoplasm of prostate: Secondary | ICD-10-CM | POA: Insufficient documentation

## 2012-08-29 DIAGNOSIS — M25519 Pain in unspecified shoulder: Secondary | ICD-10-CM | POA: Insufficient documentation

## 2012-08-29 DIAGNOSIS — C7951 Secondary malignant neoplasm of bone: Secondary | ICD-10-CM | POA: Insufficient documentation

## 2012-08-29 DIAGNOSIS — Z9079 Acquired absence of other genital organ(s): Secondary | ICD-10-CM | POA: Insufficient documentation

## 2012-08-29 NOTE — Progress Notes (Signed)
Re-con  castration resistant Prostate cancer to bone,  L3 Alert,oriented x3, no c/o pain, sob, nausea,bone scan and CT scheduled 09/02/12  Fentanyl patch left shoulder helping pain"Works prety good" no c/o pain at present 7:53 AM

## 2012-09-02 ENCOUNTER — Encounter (HOSPITAL_COMMUNITY): Payer: Self-pay

## 2012-09-02 ENCOUNTER — Encounter (HOSPITAL_COMMUNITY)
Admission: RE | Admit: 2012-09-02 | Discharge: 2012-09-02 | Disposition: A | Discharge status: Home / Self Care | Payer: Medicare Other | Source: Ambulatory Visit | Attending: Oncology | Admitting: Oncology

## 2012-09-02 ENCOUNTER — Ambulatory Visit (HOSPITAL_COMMUNITY)
Admission: RE | Admit: 2012-09-02 | Discharge: 2012-09-02 | Disposition: A | Discharge status: Home / Self Care | Payer: Medicare Other | Source: Ambulatory Visit | Attending: Oncology | Admitting: Oncology

## 2012-09-02 DIAGNOSIS — E278 Other specified disorders of adrenal gland: Secondary | ICD-10-CM | POA: Insufficient documentation

## 2012-09-02 DIAGNOSIS — C61 Malignant neoplasm of prostate: Secondary | ICD-10-CM | POA: Insufficient documentation

## 2012-09-02 DIAGNOSIS — N269 Renal sclerosis, unspecified: Secondary | ICD-10-CM | POA: Insufficient documentation

## 2012-09-02 DIAGNOSIS — C7951 Secondary malignant neoplasm of bone: Secondary | ICD-10-CM | POA: Insufficient documentation

## 2012-09-02 DIAGNOSIS — N281 Cyst of kidney, acquired: Secondary | ICD-10-CM | POA: Insufficient documentation

## 2012-09-02 DIAGNOSIS — Z9079 Acquired absence of other genital organ(s): Secondary | ICD-10-CM | POA: Insufficient documentation

## 2012-09-02 DIAGNOSIS — I080 Rheumatic disorders of both mitral and aortic valves: Secondary | ICD-10-CM | POA: Insufficient documentation

## 2012-09-02 DIAGNOSIS — K7689 Other specified diseases of liver: Secondary | ICD-10-CM | POA: Insufficient documentation

## 2012-09-02 DIAGNOSIS — I7090 Unspecified atherosclerosis: Secondary | ICD-10-CM | POA: Insufficient documentation

## 2012-09-02 DIAGNOSIS — I2510 Atherosclerotic heart disease of native coronary artery without angina pectoris: Secondary | ICD-10-CM | POA: Insufficient documentation

## 2012-09-02 DIAGNOSIS — Z9089 Acquired absence of other organs: Secondary | ICD-10-CM | POA: Insufficient documentation

## 2012-09-02 DIAGNOSIS — I77810 Thoracic aortic ectasia: Secondary | ICD-10-CM | POA: Insufficient documentation

## 2012-09-02 MED ORDER — TECHNETIUM TC 99M MEDRONATE IV KIT
25.0000 | PACK | Freq: Once | INTRAVENOUS | Status: AC | PRN
  Administered 2012-09-02: 25 via INTRAVENOUS

## 2012-09-02 MED ORDER — IOHEXOL 300 MG/ML  SOLN
100.0000 mL | Freq: Once | INTRAMUSCULAR | Status: AC | PRN
  Administered 2012-09-02: 100 mL via INTRAVENOUS

## 2012-09-03 NOTE — Progress Notes (Signed)
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   Interval Since Last Radiation: 4 years   Narrative:  The patient returns today for routine follow-up.  The patient has a painful L3 metastasis and may benefit from palliative radiation for pain control and prevention of neurologic injury. He also has some shoulder pain and leg pain.  The patient underwent reimaging with bone scan and CTs of the body this week. His bone scan shows widespread progressive disease including the shoulder.     ALLERGIES:  is allergic to other.  Meds: Current Outpatient Prescriptions  Medication Sig Dispense Refill  . aspirin EC 81 MG tablet Take 81 mg by mouth daily after breakfast.      . atorvastatin (LIPITOR) 10 MG tablet Take 10 mg by mouth at bedtime.      . Calcium Citrate-Vitamin D (CITRACAL + D PO) Take 2 tablets by mouth daily.      [name] denosumab (XGEVA) 120 MG/1.7ML SOLN Inject 120 mg into the skin every 30 (thirty) days.      . fentaNYL (DURAGESIC - DOSED MCG/HR) 25 MCG/HR Place 1 patch (25 mcg total) onto the skin every 3 (three) days.  10 patch  0  . Leuprolide Acetate (LUPRON IJ) Inject as directed. EVERY 6 MONTHS-AT DR. BORDEN'S OFFICE      . ondansetron (ZOFRAN) 8 MG tablet Take 1 tablet (8 mg total) by mouth every 12 (twelve) hours as needed for nausea.  20 tablet  1  . oxyCODONE (OXY IR/ROXICODONE) 5 MG immediate release tablet Take 1-2 tablets (5-10 mg total) by mouth every 4 (four) hours as needed for pain. PAIN  60 tablet  0  . phenazopyridine (PYRIDIUM) 100 MG tablet Take 1 tablet (100 mg total) by mouth 3 (three) times daily as needed for pain (for burning).  10 tablet  0  . zolpidem (AMBIEN) 5 MG tablet Take 1  tablet (5 mg total) by mouth at bedtime as needed for sleep.  30 tablet  1  . ZYTIGA 250 MG tablet TAKE 4 TABLETS BY MOUTH ONCE DAILY. TAKE ON EMPTY STOMACH 1 HOUR BEFORE OR 2 HOURS AFTER A MEAL.  120 tablet  0   No current facility-administered medications for this encounter.    Physical Findings: The patient is in no acute distress. Patient is alert and oriented.  vitals were not taken for this visit..  No significant changes.  Lab Findings: Lab Results  Component Value Date   WBC 5.3 08/21/2012   HGB 12.0* 08/21/2012   HCT 37.8* 08/21/2012   MCV 68.5* 08/21/2012   PLT 287 08/21/2012    @LASTCHEM @  Radiographic Findings: Ct Chest W Contrast  09/02/2012  *RADIOLOGY REPORT*  Clinical Data:  Metastatic prostate cancer.  CT CHEST, ABDOMEN AND PELVIS WITH CONTRAST  Technique:  Multidetector CT imaging of the chest, abdomen and pelvis was performed following the standard protocol during bolus administration of intravenous contrast.  Contrast: OMNIPAQUE IOHEXOL 300 MG/ML  SOLN  Comparison:  CT of the abdomen and pelvis 01/03/2012.  Chest CT 12/15/2007.  CT CHEST  Findings:  Mediastinum: Heart size is normal. There is no significant pericardial fluid, thickening or pericardial calcification. There is atherosclerosis of the thoracic  aorta, the great vessels of the mediastinum and the coronary arteries, including calcified atherosclerotic plaque in the left main, left anterior descending, left circumflex and right coronary arteries. Mild ectasia of the ascending thoracic aorta (4.1 cm in diameter) is noted. Calcifications of the aortic valve and mitral annulus. No pathologically enlarged mediastinal or hilar lymph nodes. Esophagus is unremarkable in appearance.  Lungs/Pleura: No suspicious appearing pulmonary nodules or masses are identified.  No acute consolidative airspace disease.  No pleural effusions.  Musculoskeletal: There are innumerable sclerotic lesions throughout the visualized axial and  appendicular skeleton, compatible with widespread bony metastases.  IMPRESSION:  1.  Widespread metastatic disease to the bones of the thorax. 2.  No other signs of metastatic disease to the thorax on today's examination. 3.  Atherosclerosis, including left main and three-vessel coronary artery disease. 4.  Mild ectasia of the ascending thoracic aorta (4.1 cm in diameter).  CT ABDOMEN AND PELVIS  Findings:  Abdomen/Pelvis: There are now innumerable ill-defined small hypovascular lesions scattered throughout the hepatic parenchyma, likely to reflect widespread metastatic disease.  Previously noted large cyst in the right lobe of the liver has partially involuted, now measuring only 1.6 cm in diameter.  Small hypervascular lesion in segment 2 of the liver is unchanged, and likely to represent a small flash fill cavernous hemangioma.  The appearance of the gallbladder, pancreas, spleen, left adrenal gland and right kidney is unremarkable.  A 1.2 cm nodule in the medial limb of the right adrenal gland is unchanged, and although incompletely characterized the stability in size and appearance of this lesion over time would suggest a benign lesion such as an adrenal adenoma.  Marked atrophy of the left kidney is again noted.  A small cyst in the upper pole of the left kidney is similar to the prior examination.  There is a left-sided double J ureteral stent in position with proximal loop properly reformed within the left renal pelvis and distal loop properly reformed within the lumen of the urinary bladder.  No significant volume of ascites.  No pneumoperitoneum.  No pathologic distension of small bowel.  Status post appendectomy. Status post radical prostatectomy.  No definite pathologic lymphadenopathy identified within the abdomen or pelvis.  Musculoskeletal: Iinnumerable sclerotic lesions throughout the visualized axial and appendicular skeleton, compatible with widespread metastatic disease to the bone.  IMPRESSION:   1.  Widespread metastatic disease to the bones. 2.  Interval development of innumerable ill-defined hypovascular lesions within the liver concerning for metastatic disease. 3.  Other incidental findings, similar to prior studies, as detailed above.   Original Report Authenticated By: Trudie Reed, [name]    Nm Bone Scan Whole Body  09/02/2012  *RADIOLOGY REPORT*  Clinical Data: Metastatic prostate cancer.  Serum PSA 96.86.  NUCLEAR MEDICINE WHOLE BODY BONE SCINTIGRAPHY  Technique:  Whole body anterior and posterior images were obtained approximately 3 hours after intravenous injection of radiopharmaceutical.  Radiopharmaceutical: CURIE TC-MDP TECHNETIUM TC 33M MEDRONATE IV KIT  Comparison: Whole body bone scan 10/03/2011 and 01/03/2012.  Findings: There is worsening widespread osseous metastatic disease. There is extensive involvement of the axial skeleton with multiple lesions involving the ribs, spine, pelvis and sternum.  There is also progressive involvement of the proximal appendicular skeleton. There is progressive prominent involvement in the proximal right humeral metaphysis and in the proximal left femur adjacent to the lesser trochanter. There is also involvement of both mid humeral shafts and the proximal left femoral shaft.  Some urine contamination is noted in  the perineal region.  IMPRESSION: Progressive widespread osseous metastatic disease.   Original Report Authenticated By: Carey Bullocks, [name]    Ct Abdomen Pelvis W Contrast  09/02/2012  *RADIOLOGY REPORT*  Clinical Data:  Metastatic prostate cancer.  CT CHEST, ABDOMEN AND PELVIS WITH CONTRAST  Technique:  Multidetector CT imaging of the chest, abdomen and pelvis was performed following the standard protocol during bolus administration of intravenous contrast.  Contrast: OMNIPAQUE IOHEXOL 300 MG/ML  SOLN  Comparison:  CT of the abdomen and pelvis 01/03/2012.  Chest CT 12/15/2007.  CT CHEST  Findings:  Mediastinum: Heart size is  normal. There is no significant pericardial fluid, thickening or pericardial calcification. There is atherosclerosis of the thoracic aorta, the great vessels of the mediastinum and the coronary arteries, including calcified atherosclerotic plaque in the left main, left anterior descending, left circumflex and right coronary arteries. Mild ectasia of the ascending thoracic aorta (4.1 cm in diameter) is noted. Calcifications of the aortic valve and mitral annulus. No pathologically enlarged mediastinal or hilar lymph nodes. Esophagus is unremarkable in appearance.  Lungs/Pleura: No suspicious appearing pulmonary nodules or masses are identified.  No acute consolidative airspace disease.  No pleural effusions.  Musculoskeletal: There are innumerable sclerotic lesions throughout the visualized axial and appendicular skeleton, compatible with widespread bony metastases.  IMPRESSION:  1.  Widespread metastatic disease to the bones of the thorax. 2.  No other signs of metastatic disease to the thorax on today's examination. 3.  Atherosclerosis, including left main and three-vessel coronary artery disease. 4.  Mild ectasia of the ascending thoracic aorta (4.1 cm in diameter).  CT ABDOMEN AND PELVIS  Findings:  Abdomen/Pelvis: There are now innumerable ill-defined small hypovascular lesions scattered throughout the hepatic parenchyma, likely to reflect widespread metastatic disease.  Previously noted large cyst in the right lobe of the liver has partially involuted, now measuring only 1.6 cm in diameter.  Small hypervascular lesion in segment 2 of the liver is unchanged, and likely to represent a small flash fill cavernous hemangioma.  The appearance of the gallbladder, pancreas, spleen, left adrenal gland and right kidney is unremarkable.  A 1.2 cm nodule in the medial limb of the right adrenal gland is unchanged, and although incompletely characterized the stability in size and appearance of this lesion over time would  suggest a benign lesion such as an adrenal adenoma.  Marked atrophy of the left kidney is again noted.  A small cyst in the upper pole of the left kidney is similar to the prior examination.  There is a left-sided double J ureteral stent in position with proximal loop properly reformed within the left renal pelvis and distal loop properly reformed within the lumen of the urinary bladder.  No significant volume of ascites.  No pneumoperitoneum.  No pathologic distension of small bowel.  Status post appendectomy. Status post radical prostatectomy.  No definite pathologic lymphadenopathy identified within the abdomen or pelvis.  Musculoskeletal: Iinnumerable sclerotic lesions throughout the visualized axial and appendicular skeleton, compatible with widespread metastatic disease to the bone.  IMPRESSION:  1.  Widespread metastatic disease to the bones. 2.  Interval development of innumerable ill-defined hypovascular lesions within the liver concerning for metastatic disease. 3.  Other incidental findings, similar to prior studies, as detailed above.   Original Report Authenticated By: Trudie Reed, [name]     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 He has had progressive disease on Zytiga.  The patient may  benefit from external beam radiation treatments to his more painful sites in conjunction with Xofigo.  Plan:  Today, I talked to the patient and family about the findings and work-up thus far.  We discussed the natural history of disease and general treatment, highlighting the role or radiotherapy in the management.  We discussed the available radiation techniques, and focused on the details of logistics and delivery.  We reviewed the anticipated acute and late sequelae associated with radiation in this setting.  The patient was encouraged to ask questions that I answered to the best of my ability.  I filled out a patient counseling form  during our discussion including treatment diagrams.  We retained a copy for our records.  The patient would like to proceed with radiation and will be scheduled for CT simulation.  We'll plan to treat the mid lumbar spine, right shoulder and left femur.  We will await Dr. Alver Fisher input on recent CT with respect to systemic options including Xofigo.  The majority of the patient's disease progression appears to be skeletal in nature which would respond well Xofigo. However, he does appear to have a new early liver lesions which may not response to therapy to [facility].  The question would be whether cytotoxic chemotherapy now or Standley Dakins now with reevaluation of the liver in 6 months would be preferable from the standpoint of medical oncology. The patient is scheduled to return to medical oncology follow up on may 23rd.  I spent 20 minutes minutes face to face with the patient and more than 50% of that time was spent in counseling and/or coordination of care.  _____________________________________  Artist Pais. Kathrynn Running, [name]

## 2012-09-04 ENCOUNTER — Ambulatory Visit
Admission: RE | Admit: 2012-09-04 | Discharge: 2012-09-04 | Disposition: A | Discharge status: Home / Self Care | Payer: Medicare Other | Source: Ambulatory Visit | Attending: Radiation Oncology | Admitting: Radiation Oncology

## 2012-09-04 ENCOUNTER — Encounter: Payer: Self-pay | Admitting: Radiation Oncology

## 2012-09-04 ENCOUNTER — Telehealth: Payer: Self-pay | Admitting: Radiation Oncology

## 2012-09-04 VITALS — BP 164/101 | HR 91 | Temp 98.0°F | Resp 18 | Ht 69.0 in | Wt 171.2 lb

## 2012-09-04 DIAGNOSIS — C7931 Secondary malignant neoplasm of brain: Secondary | ICD-10-CM

## 2012-09-04 DIAGNOSIS — C61 Malignant neoplasm of prostate: Secondary | ICD-10-CM | POA: Insufficient documentation

## 2012-09-04 DIAGNOSIS — C7949 Secondary malignant neoplasm of other parts of nervous system: Secondary | ICD-10-CM

## 2012-09-04 DIAGNOSIS — Z7982 Long term (current) use of aspirin: Secondary | ICD-10-CM | POA: Insufficient documentation

## 2012-09-04 DIAGNOSIS — Z79899 Other long term (current) drug therapy: Secondary | ICD-10-CM | POA: Insufficient documentation

## 2012-09-04 DIAGNOSIS — C7951 Secondary malignant neoplasm of bone: Secondary | ICD-10-CM | POA: Insufficient documentation

## 2012-09-04 NOTE — Progress Notes (Signed)
Patient presents to the clinic today accompanied by his wife for reconsult with Dr. Kathrynn Running to discuss the role of radiation therapy in the treatment of castration resistant prostate cancer to bone. Patient alert and oriented to person, place, and time. No distress noted. Steady gait noted. Pleasant affect noted. Patient complains of aching right shoulder pain 4 on a scale of 0-10.  Patient denies nausea, vomiting, headache, dizziness or diarrhea. Patient and wife here to review recent bone scan and CT scan from 09/02/2012. Reported all findings to Dr. Kathrynn Running.

## 2012-09-04 NOTE — Telephone Encounter (Signed)
Met with patient to discuss RO billing.  Patient had no concerns today. 

## 2012-09-04 NOTE — Progress Notes (Signed)
Complete PATIENT MEASURE OF DISTRESS worksheet with a score of 0 submitted to social work.  

## 2012-09-04 NOTE — Addendum Note (Signed)
Encounter addended by: Agnes Lawrence, RN on: 09/04/2012  9:12 AM<BR>     Documentation filed: Inpatient Patient Education, Inpatient Document Flowsheet, Charges VN

## 2012-09-04 NOTE — Progress Notes (Signed)
See progress note under physician encounter. 

## 2012-09-04 NOTE — Progress Notes (Signed)
  Radiation Oncology         ([phone] ________________________________  Name: Willie Gaines MRN: [id number]  Date: 09/04/2012  DOB: Apr 28, 1941  SIMULATION AND TREATMENT PLANNING NOTE  DIAGNOSIS:  72 yo gentleman with painful skeletal metastases at L3 and bilateral shouldrs from castration resistant prostate cancer  NARRATIVE:  The patient was brought to the CT Simulation planning suite.  Identity was confirmed.  All relevant records and images related to the planned course of therapy were reviewed.  The patient freely provided informed written consent to proceed with treatment after reviewing the details related to the planned course of therapy. The consent form was witnessed and verified by the simulation staff.  Then, the patient was set-up in a stable reproducible  supine position for radiation therapy.  CT images were obtained.  Surface markings were placed.  The CT images were loaded into the planning software.  Then the target and avoidance structures were contoured.  Treatment planning then occurred.  The radiation prescription was entered and confirmed.  Then, I designed and supervised the construction of a total of 5 medically necessary complex treatment devices.  These included MLC blocks to shield tissue around both shoulder targets (4 total), plus a vac loc whole body bag.  I have requested : Isodose Plan.    PLAN:  The patient will receive 30 Gy in 10 fractions to the Left Shoulder, Right Shoulder, and L3 (L2-L4).  ________________________________  [name], [name]

## 2012-09-06 ENCOUNTER — Ambulatory Visit: Payer: Medicare Other | Admitting: Radiation Oncology

## 2012-09-08 ENCOUNTER — Ambulatory Visit
Admission: RE | Admit: 2012-09-08 | Discharge: 2012-09-08 | Disposition: A | Discharge status: Home / Self Care | Payer: Medicare Other | Source: Ambulatory Visit | Attending: Radiation Oncology | Admitting: Radiation Oncology

## 2012-09-08 ENCOUNTER — Encounter: Payer: Self-pay | Admitting: Radiation Oncology

## 2012-09-09 ENCOUNTER — Ambulatory Visit
Admission: RE | Admit: 2012-09-09 | Discharge: 2012-09-09 | Disposition: A | Discharge status: Home / Self Care | Payer: Medicare Other | Source: Ambulatory Visit | Attending: Radiation Oncology | Admitting: Radiation Oncology

## 2012-09-10 ENCOUNTER — Ambulatory Visit
Admission: RE | Admit: 2012-09-10 | Discharge: 2012-09-10 | Disposition: A | Discharge status: Home / Self Care | Payer: Medicare Other | Source: Ambulatory Visit | Attending: Radiation Oncology | Admitting: Radiation Oncology

## 2012-09-11 ENCOUNTER — Ambulatory Visit
Admission: RE | Admit: 2012-09-11 | Discharge: 2012-09-11 | Disposition: A | Discharge status: Home / Self Care | Payer: Medicare Other | Source: Ambulatory Visit | Attending: Radiation Oncology | Admitting: Radiation Oncology

## 2012-09-12 ENCOUNTER — Encounter: Payer: Self-pay | Admitting: Radiation Oncology

## 2012-09-12 ENCOUNTER — Ambulatory Visit
Admission: RE | Admit: 2012-09-12 | Discharge: 2012-09-12 | Disposition: A | Discharge status: Home / Self Care | Payer: Medicare Other | Source: Ambulatory Visit | Attending: Radiation Oncology | Admitting: Radiation Oncology

## 2012-09-12 ENCOUNTER — Other Ambulatory Visit: Payer: Self-pay | Admitting: Oncology

## 2012-09-12 VITALS — BP 156/83 | HR 82 | Temp 98.3°F | Resp 20 | Wt 171.2 lb

## 2012-09-12 DIAGNOSIS — C61 Malignant neoplasm of prostate: Secondary | ICD-10-CM

## 2012-09-12 DIAGNOSIS — C7949 Secondary malignant neoplasm of other parts of nervous system: Secondary | ICD-10-CM

## 2012-09-12 DIAGNOSIS — C7931 Secondary malignant neoplasm of brain: Secondary | ICD-10-CM

## 2012-09-12 DIAGNOSIS — C7951 Secondary malignant neoplasm of bone: Secondary | ICD-10-CM

## 2012-09-12 MED ORDER — FENTANYL 25 MCG/HR TD PT72: 1.0000 | MEDICATED_PATCH | TRANSDERMAL | Status: DC

## 2012-09-12 MED ORDER — RADIAPLEXRX EX GEL
Freq: Once | CUTANEOUS | Status: AC
  Administered 2012-09-12: 14:00:00 via TOPICAL

## 2012-09-12 MED ORDER — OXYCODONE HCL 5 MG PO TABS: 5.0000 mg | ORAL_TABLET | ORAL | Status: DC | PRN

## 2012-09-12 NOTE — Progress Notes (Signed)
  Radiation Oncology         [phone]) [phone] ________________________________  Name: Willie Gaines MRN: [id number]  Date: 09/12/2012  DOB: 04-Jun-1940  Weekly Radiation Therapy Management  Current Dose: 12.5 Gy     Planned Dose:  35 Gy  Narrative . . . . . . . . The patient presents for routine under treatment assessment.                                                  Pt c/o pain in right shoulder 8/10 that began "suddenly last night". He has Duragesic patch 25 mcg; he took Oxy-IR 5 mg x 1. He states he got fair relief, but took Ambien in order to sleep. He has not taken Oxy-IR today. He has limited mobility today due to pain. Pt has some loss of appetite, fatigue. He denies other issues/ problems today. He states he always has some discomfort in left shoulder and lower back.  Post sim ed completed,                                 Set-up films were reviewed.                                 The chart was checked. Physical Findings. . .  weight is 171 lb 3.2 oz (77.656 kg). His oral temperature is 98.3 F (36.8 C). His blood pressure is 156/83 and his pulse is 82. His respiration is 20. . Weight essentially stable.  No significant changes. Impression . . . . . . . The patient is tolerating radiation. Plan . . . . . . . . . . . . Continue treatment as planned.  ________________________________  Artist Pais. Kathrynn Running, [name]

## 2012-09-12 NOTE — Progress Notes (Signed)
Post sim ed completed w/pt and wife. Gave pt "Radiation and You" booklet w/all pertinent pages marked and discussed, re: diarrhea, fatigue, nausea management, skin irritation/care, nutrition, pain. Pt requested Radiaplex; gave pt lotion w/instructions for proper use. All questions answered; pt verbalized understanding through teach back.

## 2012-09-12 NOTE — Progress Notes (Signed)
Pt c/o pain in right shoulder 8/10 that began "suddenly last night". He has Duragesic patch 25 mcg; he took Oxy-IR 5 mg x 1. He states he got fair relief, but took Ambien in order to sleep. He has not taken Oxy-IR today. He has limited mobility today due to pain. Pt has some loss of appetite, fatigue. He denies other issues/ problems today. He states he always has some discomfort in left shoulder and lower back. Post sim ed completed, charted under pt education appointment.

## 2012-09-15 ENCOUNTER — Ambulatory Visit
Admission: RE | Admit: 2012-09-15 | Discharge: 2012-09-15 | Disposition: A | Discharge status: Home / Self Care | Payer: Medicare Other | Source: Ambulatory Visit | Attending: Radiation Oncology | Admitting: Radiation Oncology

## 2012-09-16 ENCOUNTER — Ambulatory Visit
Admission: RE | Admit: 2012-09-16 | Discharge: 2012-09-16 | Disposition: A | Discharge status: Home / Self Care | Payer: Medicare Other | Source: Ambulatory Visit | Attending: Radiation Oncology | Admitting: Radiation Oncology

## 2012-09-17 ENCOUNTER — Ambulatory Visit
Admission: RE | Admit: 2012-09-17 | Discharge: 2012-09-17 | Disposition: A | Discharge status: Home / Self Care | Payer: Medicare Other | Source: Ambulatory Visit | Attending: Radiation Oncology | Admitting: Radiation Oncology

## 2012-09-18 ENCOUNTER — Ambulatory Visit
Admission: RE | Admit: 2012-09-18 | Discharge: 2012-09-18 | Disposition: A | Discharge status: Home / Self Care | Payer: Medicare Other | Source: Ambulatory Visit | Attending: Radiation Oncology | Admitting: Radiation Oncology

## 2012-09-19 ENCOUNTER — Other Ambulatory Visit (HOSPITAL_BASED_OUTPATIENT_CLINIC_OR_DEPARTMENT_OTHER): Payer: Medicare Other | Admitting: Lab

## 2012-09-19 ENCOUNTER — Encounter: Payer: Self-pay | Admitting: Radiation Oncology

## 2012-09-19 ENCOUNTER — Telehealth: Payer: Self-pay | Admitting: Oncology

## 2012-09-19 ENCOUNTER — Ambulatory Visit
Admission: RE | Admit: 2012-09-19 | Discharge: 2012-09-19 | Disposition: A | Discharge status: Home / Self Care | Payer: Medicare Other | Source: Ambulatory Visit | Attending: Radiation Oncology | Admitting: Radiation Oncology

## 2012-09-19 ENCOUNTER — Ambulatory Visit (HOSPITAL_BASED_OUTPATIENT_CLINIC_OR_DEPARTMENT_OTHER): Payer: Medicare Other | Admitting: Oncology

## 2012-09-19 VITALS — BP 168/95 | HR 83 | Temp 98.4°F | Resp 18 | Ht 69.0 in | Wt 172.9 lb

## 2012-09-19 VITALS — BP 162/90 | HR 79 | Resp 16 | Wt 174.3 lb

## 2012-09-19 DIAGNOSIS — C7931 Secondary malignant neoplasm of brain: Secondary | ICD-10-CM

## 2012-09-19 DIAGNOSIS — C61 Malignant neoplasm of prostate: Secondary | ICD-10-CM

## 2012-09-19 DIAGNOSIS — C7951 Secondary malignant neoplasm of bone: Secondary | ICD-10-CM

## 2012-09-19 LAB — COMPREHENSIVE METABOLIC PANEL (CC13)
ALT: <6 U/L (ref 0–55)
AST: 15 U/L (ref 5–34)
Albumin: 3.3 g/dL — ABNORMAL LOW (ref 3.5–5.0)
Alkaline Phosphatase: 272 U/L — ABNORMAL HIGH (ref 40–150)
BUN: 8.1 mg/dL (ref 7.0–26.0)
Potassium: 3.9 mEq/L (ref 3.5–5.1)
Sodium: 138 mEq/L (ref 136–145)
Total Protein: 7 g/dL (ref 6.4–8.3)

## 2012-09-19 LAB — CBC WITH DIFFERENTIAL/PLATELET
BASO%: 0 % (ref 0.0–2.0)
EOS%: 9.8 % — ABNORMAL HIGH (ref 0.0–7.0)
MCH: 22 pg — ABNORMAL LOW (ref 27.2–33.4)
MCV: 68.7 fL — ABNORMAL LOW (ref 79.3–98.0)
MONO%: 11.5 % (ref 0.0–14.0)
RBC: 5.43 10*6/uL (ref 4.20–5.82)
RDW: 16.3 % — ABNORMAL HIGH (ref 11.0–14.6)

## 2012-09-19 MED ORDER — LIDOCAINE-PRILOCAINE 2.5-2.5 % EX CREA: TOPICAL_CREAM | CUTANEOUS | Status: DC | PRN

## 2012-09-19 NOTE — Telephone Encounter (Signed)
gv pt appt schedule for May thru August. lmonvm for [facility] @ IR re port placement - order in EPIC. Pt aware Inetta Fermo will call re port appt.

## 2012-09-19 NOTE — Progress Notes (Signed)
Patient presents to the clinic today accompanied by his wife for PUT with Dr. Kathrynn Running. Patient alert and oriented to person, place, and time. No distress noted. Steady gait noted. Pleasant affect noted. Patient denies pain today. Patient reports that the radiation therapy has greatly helped the pain in his low back. Patient reports that the left shoulder pain has also resolved. Patient reports diminished appetite and fatigue continue. Patient denies difficulty sleeping with aid of ambien. Patient reports a mild headache last night. Patient denies numbness or tingling in any of his extremities. Reported all findings to Dr. Kathrynn Running.

## 2012-09-19 NOTE — Progress Notes (Signed)
Hematology and Oncology Follow Up Visit  Willie Gaines [id number] June 30, 1940 72 y.o. 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 Diagnosed 2009. Gleason score 4+3 = 7 PSA 9.5  Prior Therapy: He underwent a radical prostatectomy on January 01, 2008, and the pathology from that, case number [id number], by Dr. Laverle Patter showed a prostatic adenocarcinoma, Gleason score of 4+3=7, involves both lobes. Tumor extends into the extracapsular tissue, involvement of the seminal vesicle. Lymph nodes were negative, and his pathological staging of T3b N0.  The patient did very well, did received adjuvant radiation therapy due to positive margins, and PSA was undetectable post surgery.  The patient developed biochemical relapse in October of 2010 July of 2011 and presented with metastatic disease and ureteral obstruction in August of 2011. The patient started with androgen deprivation at that time as well had a stent placement The patient did well initially with hormonal deprivation up until March 2013, where he developed castration-resistant disease, again with a bone scan showed  metastatic bony involvement. The patient was enrolled in the STRIVE trial, a randomized trial for enzalutamide versus bicalutamide and after  90 days of therapy, his PSA was up to 54 and a repeat bone scan on January 03, 2012, showed a progression of disease with increase widespread skeletal metastasis.   Current therapy: Zytiga 1000 mg daily started on 02/01/2012. His disease has progressed now.  EBXRT to the spine and shoulders. Anticipate completion on 5/30.   Interim History:  Mr. Willie Gaines presents today for a follow up visit. He temains on Zytiga and is causing him to be more fatigued. He reported his pain dramatically improved. He is no longer using oxycodone but still has the Fentanyl patch on. No neurological symptoms. He did report  episodes of nausea and vomiting but has resolved now. His appetite is better at this time. No leg edema or any other symptoms. He is tolerating radiation therapy without complications at this time.   Medications: I have reviewed the patient's current medications. Current Outpatient Prescriptions  Medication Sig Dispense Refill  . aspirin EC 81 MG tablet Take 81 mg by mouth daily after breakfast.      . atorvastatin (LIPITOR) 10 MG tablet Take 10 mg by mouth at bedtime.      . Calcium Citrate-Vitamin D (CITRACAL + D PO) Take 2 tablets by mouth daily.      [name] denosumab (XGEVA) 120 MG/1.7ML SOLN Inject 120 mg into the skin every 30 (thirty) days.      . fentaNYL (DURAGESIC - DOSED MCG/HR) 25 MCG/HR Place 1 patch (25 mcg total) onto the skin every 3 (three) days.  10 patch  0  . hyaluronate sodium (RADIAPLEXRX) GEL Apply topically 2 (two) times daily.      [name] Leuprolide Acetate (LUPRON IJ) Inject as directed. EVERY 6 MONTHS-AT DR. BORDEN'S OFFICE      . ondansetron (ZOFRAN) 8 MG tablet Take 1 tablet (8 mg total) by mouth every 12 (twelve) hours as needed for nausea.  20 tablet  1  . oxyCODONE (OXY IR/ROXICODONE) 5 MG immediate release tablet Take 1-2 tablets (5-10 mg total) by mouth every 4 (four) hours as needed for pain. PAIN  120 tablet  0  . zolpidem (AMBIEN) 5 MG tablet Take 1 tablet (5 mg total) by mouth at bedtime as needed for sleep.  30 tablet  1  . ZYTIGA 250 MG tablet TAKE 4 TABLETS BY  MOUTH ONCE DAILY. TAKE ON EMPTY STOMACH 1 HOUR BEFORE OR 2 HOURS AFTER A MEAL.  120 tablet  0  . lidocaine-prilocaine (EMLA) cream Apply topically as needed.  30 g  1   No current facility-administered medications for this visit.  .  Allergies:  Allergies  Allergen Reactions  . Other     SOFT SHELL CRAB-HIVES ALL OTHER    Past Medical History, Surgical history, Social history, and Family History were reviewed and updated.  Review of Systems: Constitutional:  Negative for fever, chills, night sweats,  anorexia, weight loss, pain. Cardiovascular: no chest pain or dyspnea on exertion Respiratory: negative Neurological: negative Dermatological: negative ENT: negative Skin: Negative. Gastrointestinal: negative Genito-Urinary: negative Hematological and Lymphatic: negative Breast: negative Musculoskeletal: negative Remaining ROS negative.  Physical Exam: Blood pressure 168/95, pulse 83, temperature 98.4 F (36.9 C), temperature source Oral, resp. rate 18, height 5\' 9"  (1.753 m), weight 172 lb 14.4 oz (78.427 kg). ECOG: 0 General appearance: alert Head: Normocephalic, without obvious abnormality, atraumatic Neck: no adenopathy, no carotid bruit, no JVD, supple, symmetrical, trachea midline and thyroid not enlarged, symmetric, no tenderness/mass/nodules Lymph nodes: Cervical, supraclavicular, and axillary nodes normal. Heart:regular rate and rhythm, S1, S2 normal, no murmur, click, rub or gallop Lung:chest clear, no wheezing, rales, normal symmetric air entry Abdomen: soft, non-tender, without masses or organomegaly EXT:no erythema, induration, or nodules   Lab Results: Lab Results  Component Value Date   WBC 4.2 09/19/2012   HGB 12.0* 09/19/2012   HCT 37.3* 09/19/2012   MCV 68.7* 09/19/2012   PLT 247 09/19/2012     Chemistry      Component Value Date/Time   NA 138 09/19/2012 0956   NA 139 06/16/2012 0920   K 3.9 09/19/2012 0956   K 3.9 06/16/2012 0920   CL 107 09/19/2012 0956   CL 106 06/16/2012 0920   CO2 23 09/19/2012 0956   CO2 24 06/16/2012 0920   BUN 8.1 09/19/2012 0956   BUN 8 06/16/2012 0920   CREATININE 1.0 09/19/2012 0956   CREATININE 1.10 06/16/2012 0920      Component Value Date/Time   CALCIUM 8.2* 09/19/2012 0956   CALCIUM 7.6* 06/16/2012 0920   ALKPHOS 272* 09/19/2012 0956   ALKPHOS 215* 02/09/2012 0935   AST 15 09/19/2012 0956   AST 18 02/09/2012 0935   ALT <6 Repeated and Verified 09/19/2012 0956   ALT 9 02/09/2012 0935   BILITOT 0.43 09/19/2012 0956   BILITOT 0.3  02/09/2012 0935     Results for Willie Gaines, Willie Gaines (MRN [id number]) as of 09/19/2012 09:40  Ref. Range 06/19/2012 09:23 07/23/2012 09:27 08/21/2012 09:19  PSA Latest Range: <=4.00 ng/mL 63.01 (H) 82.53 (H) 96.86 (H)    CT CHEST, ABDOMEN AND PELVIS WITH CONTRAST  Technique: Multidetector CT imaging of the chest, abdomen and  pelvis was performed following the standard protocol during bolus  administration of intravenous contrast.  Contrast: OMNIPAQUE IOHEXOL 300 MG/ML SOLN  Comparison: CT of the abdomen and pelvis 01/03/2012. Chest CT  12/15/2007.  CT CHEST  Findings:  Mediastinum: Heart size is normal. There is no significant  pericardial fluid, thickening or pericardial calcification. There  is atherosclerosis of the thoracic aorta, the great vessels of the  mediastinum and the coronary arteries, including calcified  atherosclerotic plaque in the left main, left anterior descending,  left circumflex and right coronary arteries. Mild ectasia of the  ascending thoracic aorta (4.1 cm in diameter) is noted.  Calcifications of the aortic valve and  mitral annulus. No  pathologically enlarged mediastinal or hilar lymph nodes. Esophagus  is unremarkable in appearance.  Lungs/Pleura: No suspicious appearing pulmonary nodules or masses  are identified. No acute consolidative airspace disease. No  pleural effusions.  Musculoskeletal: There are innumerable sclerotic lesions throughout  the visualized axial and appendicular skeleton, compatible with  widespread bony metastases.  IMPRESSION:  1. Widespread metastatic disease to the bones of the thorax.  2. No other signs of metastatic disease to the thorax on today's  examination.  3. Atherosclerosis, including left main and three-vessel coronary  artery disease.  4. Mild ectasia of the ascending thoracic aorta (4.1 cm in  diameter).  CT ABDOMEN AND PELVIS  Findings:  Abdomen/Pelvis: There are now innumerable ill-defined small   hypovascular lesions scattered throughout the hepatic parenchyma,  likely to reflect widespread metastatic disease. Previously noted  large cyst in the right lobe of the liver has partially involuted,  now measuring only 1.6 cm in diameter. Small hypervascular lesion  in segment 2 of the liver is unchanged, and likely to represent a  small flash fill cavernous hemangioma. The appearance of the  gallbladder, pancreas, spleen, left adrenal gland and right kidney  is unremarkable. A 1.2 cm nodule in the medial limb of the right  adrenal gland is unchanged, and although incompletely characterized  the stability in size and appearance of this lesion over time would  suggest a benign lesion such as an adrenal adenoma. Marked atrophy  of the left kidney is again noted. A small cyst in the upper pole  of the left kidney is similar to the prior examination. There is a  left-sided double J ureteral stent in position with proximal loop  properly reformed within the left renal pelvis and distal loop  properly reformed within the lumen of the urinary bladder.  No significant volume of ascites. No pneumoperitoneum. No  pathologic distension of small bowel. Status post appendectomy.  Status post radical prostatectomy. No definite pathologic  lymphadenopathy identified within the abdomen or pelvis.  Musculoskeletal: Iinnumerable sclerotic lesions throughout the  visualized axial and appendicular skeleton, compatible with  widespread metastatic disease to the bone.  IMPRESSION:  1. Widespread metastatic disease to the bones.  2. Interval development of innumerable ill-defined hypovascular  lesions within the liver concerning for metastatic disease.  3. Other incidental findings, similar to prior studies, as  detailed above.    NUCLEAR MEDICINE WHOLE BODY BONE SCINTIGRAPHY  Technique: Whole body anterior and posterior images were obtained  approximately 3 hours after intravenous injection of   radiopharmaceutical.  Radiopharmaceutical: CURIE TC-MDP TECHNETIUM TC 69M  MEDRONATE IV KIT  Comparison: Whole body bone scan 10/03/2011 and 01/03/2012.  Findings: There is worsening widespread osseous metastatic disease.  There is extensive involvement of the axial skeleton with multiple  lesions involving the ribs, spine, pelvis and sternum. There is  also progressive involvement of the proximal appendicular skeleton.  There is progressive prominent involvement in the proximal right  humeral metaphysis and in the proximal left femur adjacent to the  lesser trochanter. There is also involvement of both mid humeral  shafts and the proximal left femoral shaft. Some urine  contamination is noted in the perineal region.  IMPRESSION:  Progressive widespread osseous metastatic disease.   Impression and Plan: This is a 72 year old gentleman with the following issues:   1. Castration-resistant prostate cancer. He is on Zytiga.  His PSA dropped from 83 to 58 but has gone up to 96.86. His CT scan  and bone scan was discussed today and showed clear progression not only in the bone but possible the liver.  At this point, I feel that he has no choice but to go with Taxotere chemotherapy.  Risks and benefits discussed in details. Complications include nausea, vomiting, weakness, fatigue, myelosuppression, neuropathy, infections, neutropenia, possible sepsis, hair and nail changes and possible need for growth factor support.  I also discussed the possible benefits that include increase in life expectancy and improvement in quality of life.   He is agreeable to proceed.   2. Back pain and L3 involvement of his cancer. He is getting radiation therapy. Much improved. I have instructed him to stop the fentanyl patch for now and use Oxycodone as needed.    3. Hormonal deprivation. He continues to be on Lupron at [facility].   4. Bone-directed therapy. I have recommended continue Xgeva at  this time. He is receiving both Lupron and Xgeva at Dr. Vevelyn Royals office, and I told him that I will be happy to continue those here if he would like, but for the time being it does not really matter where he gets it as long as he is receiving both post treatments.   5. Neutropenia prophylaxis: Neulasta will be added to each cycle.   6. IV access: risks and benefits of port insertion was discussed. He is willing to go through with it. We will arrange for that.   7. Follow up: on 6/4 for the start of cycle 1 of chemotherapy.    Jonel Sick 5/23/201410:59 AM

## 2012-09-22 ENCOUNTER — Encounter: Payer: Self-pay | Admitting: Radiation Oncology

## 2012-09-22 NOTE — Progress Notes (Signed)
  Radiation Oncology         [phone] ________________________________  Name: Willie Gaines MRN: [id number]  Date: 09/19/2012  DOB: 12-Jul-1940  Weekly Radiation Therapy Management  Current Dose: 25 Gy     Planned Dose:  35 Gy  Narrative . . . . . . . . The patient presents for routine under treatment assessment.                                                  He completed his shoulder irradation today and will complete his lumbar spine irradiation next week.'            Patient denies pain today. Patient reports that the radiation therapy has greatly helped the pain in his low back. Patient reports that the left shoulder pain has also resolved. Patient reports diminished appetite and fatigue continue. Patient denies difficulty sleeping with aid of ambien. Patient reports a mild headache last night. Patient denies numbness or tingling in any of his extremities.  The patient is without complaint.                                 Set-up films were reviewed.                                 The chart was checked. Physical Findings. . Patient alert and oriented to person, place, and time. No distress noted. Steady gait noted. Pleasant affect noted.  [name]  weight is 174 lb 4.8 oz (79.062 kg). His blood pressure is 162/90 and his pulse is 79. His respiration is 16. . Weight essentially stable.  No significant changes. Impression . . . . . . . The patient is  tolerating radiation. Plan . . . . . . . . . . . . Continue treatment as planned.  ________________________________  Artist Pais. Kathrynn Running, [name]

## 2012-09-23 ENCOUNTER — Telehealth: Payer: Self-pay | Admitting: Oncology

## 2012-09-23 ENCOUNTER — Ambulatory Visit
Admission: RE | Admit: 2012-09-23 | Discharge: 2012-09-23 | Disposition: A | Discharge status: Home / Self Care | Payer: Medicare Other | Source: Ambulatory Visit | Attending: Radiation Oncology | Admitting: Radiation Oncology

## 2012-09-23 NOTE — Telephone Encounter (Signed)
sw.. pt and advised that Dr. Clelia Croft would like to discuss moving his tx at nxt visit...pt ok and aware

## 2012-09-24 ENCOUNTER — Ambulatory Visit
Admission: RE | Admit: 2012-09-24 | Discharge: 2012-09-24 | Disposition: A | Discharge status: Home / Self Care | Payer: Medicare Other | Source: Ambulatory Visit | Attending: Radiation Oncology | Admitting: Radiation Oncology

## 2012-09-24 ENCOUNTER — Encounter: Payer: Self-pay | Admitting: Radiation Oncology

## 2012-09-24 VITALS — BP 154/102 | HR 107 | Resp 18 | Wt 165.9 lb

## 2012-09-24 DIAGNOSIS — C7931 Secondary malignant neoplasm of brain: Secondary | ICD-10-CM

## 2012-09-24 NOTE — Progress Notes (Addendum)
Patient presents to the clinic today requesting to be seen. Patient reports severe nausea and vomiting the last three out of four days. Also, patient reports "violent heves."Patient reports taking zofran as directed without relief. Patient reports that his heart often races. Ten pound weight loss noted since 23rd of last week. Patient slightly orthostatic (see vitals below). Patient denies having an appetite. Patient denies painful or difficult swallowing. Patient reports difficulty sleeping. Patient denies pain and reports the "radiation has helped greatly." Patient questions why he should continue radiation therapy if he is to start chemotherapy next week. Patient has stopped using fentanyl and oxy IR since he is no longer in pain.  Sitting 148/98, 95 Standing 154/102, 107

## 2012-09-24 NOTE — Progress Notes (Signed)
  Radiation Oncology         [phone] ________________________________  Name: Willie Gaines MRN: [id number]  Date: 09/24/2012  DOB: 09-13-1940  Weekly Radiation Therapy Management  Current Dose: 30 Gy     Planned Dose:  30 Gy  Narrative . . . . . . . . The patient presents for routine under treatment assessment.                                               The patient had a difficult weekend with significant nausea and vomiting on Saturday and Sunday. He reports that his nausea symptoms have gradually increased since starting radiation.                                     Treatment imaging was reviewed                                     The chart was checked. Physical Findings. . .  weight is 165 lb 14.4 oz (75.252 kg). His blood pressure is 154/102 and his pulse is 107. His respiration is 18. . Weight Is down 10 pounds.   the patient appears fatigued and shows orthostatic hypotension. Impression . . . . . . . The patient is experiencing increased GI toxicity related to his lumbar radiotherapy. Perhaps is related to previous abdominal surgery for bowel obstruction. His treated pain in the lumbar spine and both shoulders has resolved. . Plan . . . . . . . . . . . [name]  the patient was originally scheduled to receive 2 more fractions of radiation. However, since he has achieved palliation of pain and is experiencing increased GI toxicity, I elected to complete his course of therapy today at a final dose level 30 gray. I Encouraged the patient to maintain good nutrition as he prepares to begin chemotherapy early next week. He will Return to the  radiation oncology clinic for routine followup in one month.  ________________________________  Artist Pais. Kathrynn Running, [name]

## 2012-09-25 ENCOUNTER — Encounter (HOSPITAL_COMMUNITY): Payer: Self-pay | Admitting: Pharmacy Technician

## 2012-09-25 ENCOUNTER — Other Ambulatory Visit: Payer: Medicare Other

## 2012-09-25 ENCOUNTER — Ambulatory Visit: Payer: Medicare Other

## 2012-09-26 ENCOUNTER — Ambulatory Visit: Payer: Medicare Other

## 2012-09-26 ENCOUNTER — Ambulatory Visit: Admission: RE | Admit: 2012-09-26 | Payer: Medicare Other | Source: Ambulatory Visit | Admitting: Radiation Oncology

## 2012-09-26 ENCOUNTER — Other Ambulatory Visit: Payer: Self-pay | Admitting: Radiology

## 2012-09-29 NOTE — Progress Notes (Signed)
  Radiation Oncology         [phone] ________________________________  Name: Willie Gaines MRN: [id number]  Date: 09/08/2012  DOB: 02-04-1941  Simulation Verification Note  Status:  outpatient  NARRATIVE: The patient was brought to the treatment unit and placed in the planned treatment position. The clinical setup was verified. Then port films were obtained and uploaded to the radiation oncology medical record software.  The treatment beams were carefully compared against the planned radiation fields. The position location and shape of the radiation fields was reviewed. They targeted volume of tissue appears to be appropriately covered by the radiation beams. Organs at risk appear to be excluded as planned.  Based on my personal review, I approved the simulation verification. The patient's treatment will proceed as planned.  ------------------------------------------------  [name], [name]

## 2012-09-29 NOTE — Progress Notes (Signed)
  Radiation Oncology         ([phone] ________________________________  Name: ARISTEO HANKERSON MRN: [id number]  Date: 09/24/2012  DOB: 05-02-1940  End of Treatment Note  Diagnosis:  72 yo gentleman with painful skeletal metastases at L3 and bilateral shoulders from castration resistant prostate cancer  Indication for treatment:  Palliation of Pain, Prevention of neurologic spinal nerve root injury       Radiation treatment dates:   09/08/2012-09/24/2012  Site/dose:    1.  The lumbar spine including L2-L4 was treated to 30 Gy in 12 fractions of 2.5 Gy 2.  The left shoulder was treated to 30 Gy in 10 fractions of 3 Gy 3.  The right shoulder was treated to 30 Gy in 10 fractions of 3 Gy  Beams/energy:    1.  The lumbar spine including L2-L4 was treated with anterior and posterior fields employing 15 MV photons.  The kidneys were excluded from exposure. 2.  The left shoulder was treated with anterior and posterior fields employing 6 and 15 MV photons.  The lungs were excluded from exposure. 3.  The right shoulder was treated with anterior and posterior fields employing 6 and 15 MV photons.  The lungs were excluded from exposure.  Narrative: The patient tolerated radiation treatment relatively well.   His back pain and shoulder pain responded quickly.  Unfortunately, he suffered with significant GI symptoms from his lumbar treatment.  Given his history of bowel surgery, it was suspected that he had fixed bowel in the lumbar radiation fields daily from adhesions, and the initial plan of 14 treatments was stopped after 12 treatments, since palliation of pain was achieved to avoid further bowel exposure, especially in light of planned chemo a few days after radiation completion.  Plan: The patient has completed radiation treatment. The patient will return to radiation oncology clinic for routine followup in one month. I advised him and his wife to call or return sooner if they have any questions or  concerns related to their recovery or treatment. ________________________________  Artist Pais. Kathrynn Running, [name]

## 2012-09-30 ENCOUNTER — Other Ambulatory Visit: Payer: Self-pay | Admitting: Urology

## 2012-09-30 ENCOUNTER — Other Ambulatory Visit: Payer: Self-pay | Admitting: Oncology

## 2012-09-30 ENCOUNTER — Ambulatory Visit (HOSPITAL_COMMUNITY)
Admission: RE | Admit: 2012-09-30 | Discharge: 2012-09-30 | Disposition: A | Discharge status: Home / Self Care | Payer: Medicare Other | Source: Ambulatory Visit | Attending: Oncology | Admitting: Oncology

## 2012-09-30 ENCOUNTER — Encounter (HOSPITAL_COMMUNITY): Payer: Self-pay

## 2012-09-30 DIAGNOSIS — C61 Malignant neoplasm of prostate: Secondary | ICD-10-CM | POA: Insufficient documentation

## 2012-09-30 DIAGNOSIS — C7949 Secondary malignant neoplasm of other parts of nervous system: Secondary | ICD-10-CM

## 2012-09-30 DIAGNOSIS — Z79899 Other long term (current) drug therapy: Secondary | ICD-10-CM | POA: Insufficient documentation

## 2012-09-30 DIAGNOSIS — C801 Malignant (primary) neoplasm, unspecified: Secondary | ICD-10-CM | POA: Insufficient documentation

## 2012-09-30 LAB — CBC WITH DIFFERENTIAL/PLATELET
Basophils Relative: 0 % (ref 0–1)
Eosinophils Absolute: 0.1 10*3/uL (ref 0.0–0.7)
Eosinophils Relative: 3 % (ref 0–5)
HCT: 35.6 % — ABNORMAL LOW (ref 39.0–52.0)
Hemoglobin: 11.7 g/dL — ABNORMAL LOW (ref 13.0–17.0)
Lymphocytes Relative: 13 % (ref 12–46)
MCH: 22 pg — ABNORMAL LOW (ref 26.0–34.0)
MCHC: 32.9 g/dL (ref 30.0–36.0)
Monocytes Absolute: 0.8 10*3/uL (ref 0.1–1.0)
Neutro Abs: 2.7 10*3/uL (ref 1.7–7.7)
RBC: 5.31 MIL/uL (ref 4.22–5.81)

## 2012-09-30 LAB — PROTIME-INR: Prothrombin Time: 14 seconds (ref 11.6–15.2)

## 2012-09-30 MED ORDER — MIDAZOLAM HCL 2 MG/2ML IJ SOLN
INTRAMUSCULAR | Status: AC
  Filled 2012-09-30: qty 6

## 2012-09-30 MED ORDER — LIDOCAINE HCL 1 % IJ SOLN
INTRAMUSCULAR | Status: AC
  Filled 2012-09-30: qty 20

## 2012-09-30 MED ORDER — HEPARIN SOD (PORK) LOCK FLUSH 100 UNIT/ML IV SOLN
INTRAVENOUS | Status: AC | PRN
  Administered 2012-09-30: 500 [IU]

## 2012-09-30 MED ORDER — FENTANYL CITRATE 0.05 MG/ML IJ SOLN
INTRAMUSCULAR | Status: AC | PRN
  Administered 2012-09-30 (×4): 50 ug via INTRAVENOUS

## 2012-09-30 MED ORDER — SODIUM CHLORIDE 0.9 % IV SOLN
INTRAVENOUS | Status: DC
  Administered 2012-09-30: 12:00:00 via INTRAVENOUS

## 2012-09-30 MED ORDER — MIDAZOLAM HCL 2 MG/2ML IJ SOLN
INTRAMUSCULAR | Status: AC | PRN
  Administered 2012-09-30 (×4): 1 mg via INTRAVENOUS

## 2012-09-30 MED ORDER — FENTANYL CITRATE 0.05 MG/ML IJ SOLN
INTRAMUSCULAR | Status: AC
  Filled 2012-09-30: qty 6

## 2012-09-30 MED ORDER — CEFAZOLIN SODIUM-DEXTROSE 2-3 GM-% IV SOLR
2.0000 g | Freq: Once | INTRAVENOUS | Status: AC
  Administered 2012-09-30: 2 g via INTRAVENOUS
  Filled 2012-09-30: qty 50

## 2012-09-30 NOTE — Procedures (Signed)
Successful placement of right jugular port.  Tip in lower SVC and ready to use.  No immediate complication.

## 2012-09-30 NOTE — Progress Notes (Signed)
Spoke to [employer]  About pt taking his own med for pain.  He states that will be fine with a sip of water.

## 2012-09-30 NOTE — Progress Notes (Signed)
Pt took 1000mg  tylenol of his own med at this time. P.A. Dorette Grate this.

## 2012-09-30 NOTE — H&P (Signed)
Chief Complaint: "I'm here for a port" Referring Physician:Shadad HPI: Willie Gaines is an 72 y.o. male with metastatic prostate cancer and needs a portacath to begin chemotherapy. PMHx and meds reviewed. Denies recent illness, fevers.  Past Medical History:  Past Medical History  Diagnosis Date  . Blood transfusion     ?   [name] Cancer     hx of prostate cancer -S/P ROBOTIC PROSTATECTOMY --THEN RADIATION TX FOR 4 MONTHS--;LAST TX JUNE 2010 ?     NOW HAS METASTATIC DISEASE AND LEFT URETERAL OBSTRUCTION  AND  HAS LEFT URETERAL  STENT    Past Surgical History:  Past Surgical History  Procedure Laterality Date  . Other surgical history      cystoscopy x multiple times with stent change   . Other surgical history      prostatectomy   . Cystoscopy w/ ureteral stent placement  06/01/2011    Procedure: CYSTOSCOPY WITH STENT REPLACEMENT;  Surgeon: Crecencio Mc, MD;  Location: [facility];  Service: Urology;  Laterality: N/A;  left stent   . Robot assisted laparoscopic radical prostatectomy  2009  . Rotator cuff repair      BILATERAL  . Appendectomy  1955  . Eye surgery  2012    7/12 and 8/12  CATARACT EXTRACTIONS  . Colon surgery      04/11/09 LAPAROSCOPIC ASSISTED RIGHT HEMI-COLECTOMY FOR TUBULOVILLOUS ADENOMA AND POLYP.  04/25/09 EXPLORATORY LAP, LYSIS  ADHESIONS, EVACUATION INFECTED HEMATOMA AND ABSCESS AND DIVERTING LOOP ILEOSTOMY.   08/05/09  LYSIS OF ADHESIONS AND ILEOSTOMY TAKEDOWN.  [name] Cystoscopy w/ ureteral stent placement  10/04/2011    Procedure: CYSTOSCOPY WITH STENT REPLACEMENT;  Surgeon: Crecencio Mc, MD;  Location: [facility];  Service: Urology;  Laterality: Left;  . Cystoscopy w/ ureteral stent placement  01/28/2012    Procedure: CYSTOSCOPY WITH STENT REPLACEMENT;  Surgeon: Crecencio Mc, MD;  Location: [facility];  Service: Urology;  Laterality: Left;  LEFT URETERAL STENT CHANGE  C-ARM    . Cystoscopy w/ ureteral stent placement Left 06/20/2012    Procedure: CYSTOSCOPY WITH STENT REPLACEMENT;   Surgeon: Crecencio Mc, MD;  Location: [facility];  Service: Urology;  Laterality: Left;    Family History: History reviewed. No pertinent family history.  Social History:  reports that he quit smoking about 34 years ago. His smoking use included Cigarettes. He has a 25 pack-year smoking history. He has never used smokeless tobacco. He reports that he does not drink alcohol or use illicit drugs.  Allergies:  Allergies  Allergen Reactions  . Other     SOFT SHELL CRAB-HIVES ALL OTHER    Medications: atorvastatin (LIPITOR) 10 MG tablet (Taking) Sig - Route: Take 10 mg by mouth at bedtime. - Oral Class: Historical Med Number of times this order has been changed since signing: 1 Order [address] Calcium Citrate-Vitamin D (CITRACAL + D PO) (Taking) Sig - Route: Take 2 tablets by mouth every morning. - Oral Class: Historical Med Number of times this order has been changed since signing: 3 Order [address] Leuprolide Acetate (LUPRON IJ) (Taking) Sig - Route: Inject as directed every 6 (six) months. - Injection Class: Historical Med Number of times this order has been changed since signing: 5 Order [address] lidocaine-prilocaine (EMLA) cream (Taking) Sig - Route: Apply 1 application topically daily as needed (Applies to port-a-cath.). - Topical Class: Historical Med ondansetron (ZOFRAN) 8 MG tablet (Taking) 20 tablet 1 07/08/2012 Sig - Route: Take 1 tablet (8 mg total) by mouth every  12 (twelve) hours as needed for nausea. - Oral Number of times this order has been changed since signing: 1 Order [address] oxyCODONE (OXY IR/ROXICODONE) 5 MG immediate release tablet (Taking) 120 tablet 0 09/12/2012 Sig - Route: Take 1-2 tablets (5-10 mg total) by mouth every 4 (four) hours as needed for pain. PAIN - Oral Class: Print Number of times this order has been changed since signing: 1 Order [address] zolpidem (AMBIEN) 5 MG tablet (Taking) 30 tablet 1 03/12/2012 Sig - Route: Take 1 tablet (5 mg total) by mouth at bedtime  as needed for sleep. - Oral Class: Print Number of times this order has been changed since signing: 1 Order [address] denosumab (XGEVA) 120 MG/1.7ML SOLN Sig - Route: Inject 120 mg into the skin every 30 (thirty) days   Please HPI for pertinent positives, otherwise complete 10 system ROS negative.  Physical Exam: BP 160/88  Pulse 93  Temp(Src) 98.5 F (36.9 C) (Oral)  Resp 18  SpO2 100% There is no weight on file to calculate BMI.   General Appearance:  Alert, cooperative, no distress, appears stated age  ENT: Unremarkable  Neck: Supple, symmetrical, trachea midline  Lungs:   Clear to auscultation bilaterally, no w/r/r, respirations unlabored without use of accessory muscles.  Chest Wall:  No tenderness or deformity  Heart:  Regular rate and rhythm, S1, S2 normal, no murmur, rub or gallop.  Extremities: Extremities normal, atraumatic, no cyanosis or edema  Neurologic: Normal affect, no gross deficits.   Results for orders placed during the hospital encounter of 09/30/12 (from the past 48 hour(s))  APTT     Status: None   Collection Time    09/30/12 11:50 AM      Result Value Range   aPTT 29  24 - 37 seconds  CBC WITH DIFFERENTIAL     Status: Abnormal (Preliminary result)   Collection Time    09/30/12 11:50 AM      Result Value Range   WBC 4.1  4.0 - 10.5 K/uL   RBC 5.31  4.22 - 5.81 MIL/uL   Hemoglobin 11.7 (*) 13.0 - 17.0 g/dL   HCT 16.1 (*) 09.6 - 04.5 %   MCV 67.0 (*) 78.0 - 100.0 fL   MCH 22.0 (*) 26.0 - 34.0 pg   MCHC 32.9  30.0 - 36.0 g/dL   RDW 40.9  81.1 - 91.4 %   Platelets 199  150 - 400 K/uL   Neutrophils Relative % PENDING  43 - 77 %   Neutro Abs PENDING  1.7 - 7.7 K/uL   Band Neutrophils PENDING  0 - 10 %   Lymphocytes Relative PENDING  12 - 46 %   Lymphs Abs PENDING  0.7 - 4.0 K/uL   Monocytes Relative PENDING  3 - 12 %   Monocytes Absolute PENDING  0.1 - 1.0 K/uL   Eosinophils Relative PENDING  0 - 5 %   Eosinophils Absolute PENDING  0.0 - 0.7 K/uL    Basophils Relative PENDING  0 - 1 %   Basophils Absolute PENDING  0.0 - 0.1 K/uL   WBC Morphology PENDING     RBC Morphology PENDING     Smear Review PENDING     nRBC PENDING  0 /100 WBC   Metamyelocytes Relative PENDING     Myelocytes PENDING     Promyelocytes Absolute PENDING     Blasts PENDING    PROTIME-INR     Status: None   Collection Time  09/30/12 11:50 AM      Result Value Range   Prothrombin Time 14.0  11.6 - 15.2 seconds   INR 1.09  0.00 - 1.49   No results found.  Assessment/Plan Metastatic prostate cancer For Port placement, discussed procedure, risks, complications, use of sedation Labs reviewed, ok Consent signed in chart  Brayton El PA-C 09/30/2012, 1:18 PM

## 2012-10-02 ENCOUNTER — Other Ambulatory Visit (HOSPITAL_BASED_OUTPATIENT_CLINIC_OR_DEPARTMENT_OTHER): Payer: Medicare Other | Admitting: Lab

## 2012-10-02 ENCOUNTER — Ambulatory Visit (HOSPITAL_BASED_OUTPATIENT_CLINIC_OR_DEPARTMENT_OTHER): Payer: Medicare Other

## 2012-10-02 VITALS — BP 146/86 | HR 87 | Temp 98.7°F | Resp 18

## 2012-10-02 DIAGNOSIS — Z5111 Encounter for antineoplastic chemotherapy: Secondary | ICD-10-CM

## 2012-10-02 DIAGNOSIS — C61 Malignant neoplasm of prostate: Secondary | ICD-10-CM

## 2012-10-02 DIAGNOSIS — C7949 Secondary malignant neoplasm of other parts of nervous system: Secondary | ICD-10-CM

## 2012-10-02 DIAGNOSIS — C7951 Secondary malignant neoplasm of bone: Secondary | ICD-10-CM

## 2012-10-02 LAB — CBC WITH DIFFERENTIAL/PLATELET
Basophils Absolute: 0 10*3/uL (ref 0.0–0.1)
Eosinophils Absolute: 0.1 10*3/uL (ref 0.0–0.5)
HGB: 12.9 g/dL — ABNORMAL LOW (ref 13.0–17.1)
MCV: 67.4 fL — ABNORMAL LOW (ref 79.3–98.0)
MONO#: 0.7 10*3/uL (ref 0.1–0.9)
MONO%: 31.6 % — ABNORMAL HIGH (ref 0.0–14.0)
NEUT#: 0.8 10*3/uL — ABNORMAL LOW (ref 1.5–6.5)
RBC: 5.8 10*6/uL (ref 4.20–5.82)
RDW: 15.8 % — ABNORMAL HIGH (ref 11.0–14.6)
WBC: 2.2 10*3/uL — ABNORMAL LOW (ref 4.0–10.3)
nRBC: 0 % (ref 0–0)

## 2012-10-02 LAB — COMPREHENSIVE METABOLIC PANEL (CC13)
ALT: <6 U/L (ref 0–55)
Albumin: 2.8 g/dL — ABNORMAL LOW (ref 3.5–5.0)
CO2: 21 mEq/L — ABNORMAL LOW (ref 22–29)
Calcium: 7.8 mg/dL — ABNORMAL LOW (ref 8.4–10.4)
Chloride: 107 mEq/L (ref 98–107)
Glucose: 146 mg/dl — ABNORMAL HIGH (ref 70–99)
Potassium: 3.7 mEq/L (ref 3.5–5.1)
Sodium: 136 mEq/L (ref 136–145)
Total Bilirubin: 0.36 mg/dL (ref 0.20–1.20)
Total Protein: 6.7 g/dL (ref 6.4–8.3)

## 2012-10-02 MED ORDER — SODIUM CHLORIDE 0.9 % IV SOLN
75.0000 mg/m2 | Freq: Once | INTRAVENOUS | Status: AC
  Administered 2012-10-02: 150 mg via INTRAVENOUS
  Filled 2012-10-02: qty 15

## 2012-10-02 MED ORDER — HEPARIN SOD (PORK) LOCK FLUSH 100 UNIT/ML IV SOLN
500.0000 [IU] | Freq: Once | INTRAVENOUS | Status: AC | PRN
  Administered 2012-10-02: 500 [IU]
  Filled 2012-10-02: qty 5

## 2012-10-02 MED ORDER — SODIUM CHLORIDE 0.9 % IV SOLN
Freq: Once | INTRAVENOUS | Status: AC
  Administered 2012-10-02: 14:00:00 via INTRAVENOUS

## 2012-10-02 MED ORDER — DEXAMETHASONE SODIUM PHOSPHATE 10 MG/ML IJ SOLN
10.0000 mg | Freq: Once | INTRAMUSCULAR | Status: AC
  Administered 2012-10-02: 10 mg via INTRAVENOUS

## 2012-10-02 MED ORDER — ONDANSETRON 8 MG/50ML IVPB (CHCC)
8.0000 mg | Freq: Once | INTRAVENOUS | Status: AC
  Administered 2012-10-02: 8 mg via INTRAVENOUS

## 2012-10-02 MED ORDER — SODIUM CHLORIDE 0.9 % IJ SOLN
10.0000 mL | INTRAMUSCULAR | Status: DC | PRN
  Administered 2012-10-02: 10 mL
  Filled 2012-10-02: qty 10

## 2012-10-02 NOTE — Patient Instructions (Signed)
[facility] Discharge Instructions for Patients Receiving Chemotherapy  Today you received the following chemotherapy agents Taxotere  To help prevent nausea and vomiting after your treatment, we encourage you to take your nausea medication as needed   If you develop nausea and vomiting that is not controlled by your nausea medication, call the clinic.   BELOW ARE SYMPTOMS THAT SHOULD BE REPORTED IMMEDIATELY:  *FEVER GREATER THAN 100.5 F  *CHILLS WITH OR WITHOUT FEVER  NAUSEA AND VOMITING THAT IS NOT CONTROLLED WITH YOUR NAUSEA MEDICATION  *UNUSUAL SHORTNESS OF BREATH  *UNUSUAL BRUISING OR BLEEDING  TENDERNESS IN MOUTH AND THROAT WITH OR WITHOUT PRESENCE OF ULCERS  *URINARY PROBLEMS  *BOWEL PROBLEMS  UNUSUAL RASH Items with * indicate a potential emergency and should be followed up as soon as possible.  Feel free to call the clinic you have any questions or concerns. The clinic phone number is [phone].    

## 2012-10-03 ENCOUNTER — Telehealth: Payer: Self-pay | Admitting: *Deleted

## 2012-10-03 ENCOUNTER — Other Ambulatory Visit: Payer: Self-pay | Admitting: *Deleted

## 2012-10-03 ENCOUNTER — Ambulatory Visit (HOSPITAL_BASED_OUTPATIENT_CLINIC_OR_DEPARTMENT_OTHER): Payer: Medicare Other

## 2012-10-03 ENCOUNTER — Other Ambulatory Visit: Payer: Self-pay | Admitting: Oncology

## 2012-10-03 ENCOUNTER — Ambulatory Visit: Payer: Self-pay

## 2012-10-03 VITALS — BP 188/101 | HR 103 | Temp 98.3°F

## 2012-10-03 DIAGNOSIS — C7951 Secondary malignant neoplasm of bone: Secondary | ICD-10-CM

## 2012-10-03 DIAGNOSIS — C61 Malignant neoplasm of prostate: Secondary | ICD-10-CM

## 2012-10-03 DIAGNOSIS — R112 Nausea with vomiting, unspecified: Secondary | ICD-10-CM

## 2012-10-03 MED ORDER — HEPARIN SOD (PORK) LOCK FLUSH 100 UNIT/ML IV SOLN
500.0000 [IU] | Freq: Once | INTRAVENOUS | Status: AC
  Administered 2012-10-03: 500 [IU] via INTRAVENOUS
  Filled 2012-10-03: qty 5

## 2012-10-03 MED ORDER — SODIUM CHLORIDE 0.9 % IV SOLN
Freq: Once | INTRAVENOUS | Status: AC
  Administered 2012-10-03: 11:00:00 via INTRAVENOUS

## 2012-10-03 MED ORDER — ONDANSETRON 8 MG/50ML IVPB (CHCC)
8.0000 mg | Freq: Once | INTRAVENOUS | Status: AC
  Administered 2012-10-03: 8 mg via INTRAVENOUS

## 2012-10-03 MED ORDER — SODIUM CHLORIDE 0.9 % IJ SOLN
10.0000 mL | INTRAMUSCULAR | Status: DC | PRN
  Administered 2012-10-03: 10 mL via INTRAVENOUS
  Filled 2012-10-03: qty 10

## 2012-10-03 MED ORDER — PEGFILGRASTIM INJECTION 6 MG/0.6ML
6.0000 mg | Freq: Once | SUBCUTANEOUS | Status: AC
  Administered 2012-10-03: 6 mg via SUBCUTANEOUS
  Filled 2012-10-03: qty 0.6

## 2012-10-03 MED ORDER — FAMOTIDINE IN NACL 20-0.9 MG/50ML-% IV SOLN
20.0000 mg | Freq: Once | INTRAVENOUS | Status: AC
  Administered 2012-10-03: 20 mg via INTRAVENOUS

## 2012-10-03 NOTE — Progress Notes (Signed)
Patient here for Neulasta injection following 1st chemo treatment.  Is having a lot of nausea and vomiting since 4:00AM today.   Also BP today is 188/101-103.  Rechecked manually for verification.   Discussed with Dr Clelia Croft who is going to order IV fluids and Zofran.

## 2012-10-03 NOTE — Patient Instructions (Signed)
Dehydration, Adult Dehydration is when you lose more fluids from the body than you take in. Vital organs like the kidneys, brain, and heart cannot function without a proper amount of fluids and salt. Any loss of fluids from the body can cause dehydration.  CAUSES   Vomiting.  Diarrhea.  Excessive sweating.  Excessive urine output.  Fever. SYMPTOMS  Mild dehydration  Thirst.  Dry lips.  Slightly dry mouth. Moderate dehydration  Very dry mouth.  Sunken eyes.  Skin does not bounce back quickly when lightly pinched and released.  Dark urine and decreased urine production.  Decreased tear production.  Headache. Severe dehydration  Very dry mouth.  Extreme thirst.  Rapid, weak pulse (more than 100 beats per minute at rest).  Cold hands and feet.  Not able to sweat in spite of heat and temperature.  Rapid breathing.  Blue lips.  Confusion and lethargy.  Difficulty being awakened.  Minimal urine production.  No tears. DIAGNOSIS  Your caregiver will diagnose dehydration based on your symptoms and your exam. Blood and urine tests will help confirm the diagnosis. The diagnostic evaluation should also identify the cause of dehydration. TREATMENT  Treatment of mild or moderate dehydration can often be done at home by increasing the amount of fluids that you drink. It is best to drink small amounts of fluid more often. Drinking too much at one time can make vomiting worse. Refer to the home care instructions below. Severe dehydration needs to be treated at the hospital where you will probably be given intravenous (IV) fluids that contain water and electrolytes. HOME CARE INSTRUCTIONS   Ask your caregiver about specific rehydration instructions.  Drink enough fluids to keep your urine clear or pale yellow.  Drink small amounts frequently if you have nausea and vomiting.  Eat as you normally do.  Avoid:  Foods or drinks high in sugar.  Carbonated  drinks.  Juice.  Extremely hot or cold fluids.  Drinks with caffeine.  Fatty, greasy foods.  Alcohol.  Tobacco.  Overeating.  Gelatin desserts.  Wash your hands well to avoid spreading bacteria and viruses.  Only take over-the-counter or prescription medicines for pain, discomfort, or fever as directed by your caregiver.  Ask your caregiver if you should continue all prescribed and over-the-counter medicines.  Keep all follow-up appointments with your caregiver. SEEK MEDICAL CARE IF:  You have abdominal pain and it increases or stays in one area (localizes).  You have a rash, stiff neck, or severe headache.  You are irritable, sleepy, or difficult to awaken.  You are weak, dizzy, or extremely thirsty. SEEK IMMEDIATE MEDICAL CARE IF:   You are unable to keep fluids down or you get worse despite treatment.  You have frequent episodes of vomiting or diarrhea.  You have blood or green matter (bile) in your vomit.  You have blood in your stool or your stool looks black and tarry.  You have not urinated in 6 to 8 hours, or you have only urinated a small amount of very dark urine.  You have a fever.  You faint. MAKE SURE YOU:   Understand these instructions.  Will watch your condition.  Will get help right away if you are not doing well or get worse. Document Released: 04/16/2005 Document Revised: 07/09/2011 Document Reviewed: 12/04/2010 Exit[employer] Patient Information 2014 [employer].  

## 2012-10-03 NOTE — Patient Instructions (Addendum)

## 2012-10-03 NOTE — Telephone Encounter (Signed)
See progress notes on injection encounter

## 2012-10-03 NOTE — Telephone Encounter (Deleted)
Patient here for Neulasta injection following 1st chemo treatment.   Had previously called the triage nurse and complained about N/V this morning.

## 2012-10-03 NOTE — Telephone Encounter (Signed)
ON 10/02/12 PT. HAD FIRST CHEMO TREATMENT WITH TAXOTERE. THE PAST 24 HOURS PT. HAS HAD 40 OUNCES OF FLUIDS. NO PROBLEMS EATING UNTIL EARLY THIS MORNING. UNABLE TO KEEP ANYTHING DOWN INCLUDING THE ZOFRAN. HE HAS VOMITED SIX TIMES. THIS NOTE TO KRISTIN CURCIO,NP.

## 2012-10-03 NOTE — Progress Notes (Signed)
1115- Pt c/o heartburn despite having zofran.  Verbal order received and read back from Clenton Pare, NP to administer pepcid 20 mg IV x 1, and that pt may want to take something OTC such as prilosec on a daily basis.  Pt verbalizes understanding.  1250- PT states he is feeling better, has drank some water.  Pt states he still has some heartburn.  Spoke with Dr. Clelia Croft, who states pt may take maalox or tums at home.  Reviewed with pt types of foods and liquids to consume to avoid nausea/vomiting and indigestion.  Pt verbalizes understanding and knows to call if any further problems.

## 2012-10-06 ENCOUNTER — Encounter (HOSPITAL_COMMUNITY): Payer: Self-pay | Admitting: Urology

## 2012-10-06 ENCOUNTER — Telehealth: Payer: Self-pay | Admitting: *Deleted

## 2012-10-06 NOTE — Progress Notes (Signed)
Patient currently receiving chemotherapy.  Last dose of chemo on 10/02/12.

## 2012-10-06 NOTE — Telephone Encounter (Signed)
Patient calling to say he has a black area in right arm pit, bumpy, burns,itches. Per dr Clelia Croft, just watch it, should get better on it's own. Also patient wanted to be referred to dietitian to help him gain weight. Left patient's name and # on barb neff's VM.

## 2012-10-11 NOTE — H&P (Signed)
History of Present Illness  Willie Gaines is a 72 year old with the following urologic history:  1) Metastatic prostate cancer: He is s/p a non-nerve sparing RAL radical prostectomy on January 01, 2008. His postoperative course was complicated by a urine leak which resolved with catheter drainage. His pathology demonstrated pT3b N0 Mx (16 nodes negative), Gleason 4+3=7 (tertiary pattern 5) adenocarcinoma with positive surgical margins at the seminal vesicles. His PSA initially became undetectable after surgery and he proceeded with adjuvant radiation therapy which he completed in June 2010. He developed a biochemical recurrence in October 2010. 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 He began androgen deprivation with Lupron in August 2011. 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 He enrolled in the STRIVE trial in May 2013 randomizing patients to enzalutimide vs. bicalutimide. He was noted to have progression of his bone metastases in September 2013 as well as a rapid increase in his PSA and came off study in early October 2013.  He then began treatment with abiraterone under the care of Dr. Clelia Croft in October 2013.  2) Left ureteral obstruction: This was diagnosed in August 2011 and it has been managed with ureteral stent drainage. Last stent: 06/20/12 (6 x 24)  3) Testosterone deficiency/osteopenia/CRPC with bone metastases: He has a history of osteopenia and takes Vitamin D and calcium supplementation for osteoporosis prevention. Current therapy: Lupron 30 mg (05/21/12), abiraterone Sep 2013: Normal (improved from baseline) Bone treatment: Xgeva 120 mg monthly  Interval history:  He follows up  today for further evaluation of his left ureteral obstruction and for his scheduled androgen deprivation therapy for ongoing management of his prostate cancer.  He recently was treated with palliative radiation therapy which has significantly improved his pain symptoms.  He has progressed on apparatus around and is scheduled to begin docetaxel chemotherapy on June 5.  He continues to tolerate his stent relatively well.  He admits that he has only been taking his calcium supplementation on an intermittent basis.     Past Medical History Problems  1. History of  Acute Pancreatitis 577.0 2. 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 Prostate Cancer 185  Surgical History Problems  1. History of  Appendectomy 2. History of  Cystoscopy With Insertion Of Ureteral Stent Left 3. History of  Cystoscopy With Insertion Of Ureteral Stent Left 4. History of  Cystoscopy With Insertion Of Ureteral Stent Left 5. History of  Cystoscopy With Insertion Of Ureteral Stent Left 6. History of  Cystoscopy With Insertion Of Ureteral Stent Left 7. History of  Cystoscopy With Insertion Of Ureteral Stent Left 8. History of  Cystoscopy With Insertion Of Ureteral Stent Left 9. History of  Cystoscopy With Insertion Of Ureteral Stent Left 10. History of  Ileostomy 11. History of  Ileostomy Closure 12. History of  Partial Colectomy 13. History of  Prostatect Retropubic Radical W/ Nerve Sparing Laparoscopic 14. History of  Rotator Cuff Repair Bilateral  Current Meds 1. Atorvastatin Calcium 10 MG Oral Tablet; Therapy: 28Jun2013 to 2. Calcium 600 TABS; Therapy: (Recorded:07Jun2013) to 3. EQ Aspirin Low Dose 81 MG TABS; Therapy: (Recorded:17Apr2012) to 4. Iron TABS; Therapy: (Recorded:11Jun2013) to 5. Lipitor 20 MG Oral Tablet; Therapy: (Recorded:22Jan2014) to  6. Ondansetron 8 MG Oral Tablet Dispersible; Therapy: 12Oct2013 to 7. OxyCODONE HCl 5 MG Oral Tablet; Therapy: 27Sep2013 to 8.  Promethazine HCl 25 MG Oral Tablet; Therapy: 12Oct2013 to 9. Zolpidem Tartrate 5 MG Oral Tablet; Therapy: 13Nov2013 to 10. Zytiga 250 MG Oral Tablet; Therapy: (Recorded:22Jan2014) to 11. ZzzQuil LIQD; Therapy: (Recorded:12Sep2013) to  Allergies Medication  1. No Known Drug Allergies  Family History Problems  1. Paternal history of  Adenocarcinoma Of The Pancreas 2. Maternal history of  Asthma V17.5 3. Fraternal history of  Prostate Cancer V16.42  Social History Problems  1. Alcohol Use 2. Former Smoker V15.82 quit smoking in 1981 3. Marital History - Currently Married 4. Occupation: retired Nurse, children's  5. History of  Tobacco Use  Vitals Vital Signs [Data Includes: Last 1 Day]  29May2014 03:15PM  BMI Calculated: 23.99 BSA Calculated: 1.89 Weight: 162 lb  Blood Pressure: 143 / 90 Heart Rate: 103  Physical Exam Constitutional: Well nourished and well developed . No acute distress.  Pulmonary: No respiratory distress and normal respiratory rhythm and effort.  Cardiovascular: Heart rate and rhythm are normal . No peripheral edema.  Neuro/Psych:. Mood and affect are appropriate.    Results/Data Urine [Data Includes: Last 1 Day]   29May2014  COLOR AMBER   APPEARANCE CLOUDY   SPECIFIC GRAVITY 1.025   pH 5.5   GLUCOSE NEG mg/dL  BILIRUBIN SMALL   KETONE 15 mg/dL  BLOOD MOD   PROTEIN 130 mg/dL  UROBILINOGEN 0.2 mg/dL  NITRITE NEG   LEUKOCYTE ESTERASE MOD   SQUAMOUS EPITHELIAL/HPF RARE   WBC 21-50 WBC/hpf  RBC 0-2 RBC/hpf  BACTERIA MODERATE   CRYSTALS NONE SEEN   CASTS Hyaline casts noted     Urine will be cultured.   Assessment Assessed  1. Prostate Cancer 185 2. Osteopenia 733.90 3. Ureteral Obstruction 593.4 4. 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 Xgeva 120 MG/1.7ML Subcutaneous Solution; INJECT 120  MG  Subcutaneous; Done: 29May2014  04:03PM; Status: COMPLETE Prostate Cancer (185)  3. Lupron Depot 30 MG Intramuscular Kit; INJECT 30 MG Intramuscular; Done: 29May2014  04:03PM; Status: COMPLETE 4. BASIC METABOLIC PANEL  Requested for: 29May2014 5. Follow-up Month x 4 Office  Follow-up  Requested for: 29May2014 6. Follow-up Schedule Surgery Office  Follow-up  Done: 29May2014  Discussion/Summary  1.  Left ureteral obstruction: He will be scheduled for cystoscopy and left ureteral stent change in the near future.  We will plan to have this performed in a few weeks right before he begins his second cycle of chemotherapy.  His urine will be cultured and he will be treated with appropriate antibiotic therapy.  2.  Incontinence/urinary tract infection: His symptoms remain stable related to his stent.  These are not any more bothersome than they had been previously.  3.  Bone metastases: He will receive Xgeva 120 mg today.  I have encouraged him to be more compliant with his calcium therapy considering his mildly low calcium today although it remains within the normal range.  4.  Metastatic castrate resistant prostate cancer: He will receive Lupron 30 mg today and will follow up in 4 months for his next injection.  He will continue follow-up and treatment with Dr. Clelia Croft.  Cc: Dr. Eli Hose Dr. Margaretmary Dys Dr. Elias Else     Verified Results URINE CULTURE1 29May2014 [id number] Willie Gaines  SOURCE : Willie Gaines  CATCH SPECIMEN TYPE: CLEAN CATCH  [Sep 29, 2012 5:24AM Stanislaus Kaltenbach] Please notify Willie Gaines that his urine culture was negative. He does not need to begin any antibiotics ahead of his upcoming stent change. I will give him antibiotics that day as per usual protocol.   Test Name Result Flag Reference  CULTURE, URINE1 Culture, Urine1    ===== COLONY COUNT: =====  NO GROWTH   FINAL REPORT: NO GROWTH     1. Amended By: Heloise Purpura; 09/29/2012 5:24 AMEST   Signatures Electronically signed by : Heloise Purpura, [name]; Sep 29 2012  5:24AM

## 2012-10-13 ENCOUNTER — Ambulatory Visit (HOSPITAL_COMMUNITY)
Admission: RE | Admit: 2012-10-13 | Discharge: 2012-10-13 | Disposition: A | Discharge status: Home / Self Care | Payer: Medicare Other | Source: Ambulatory Visit | Attending: Urology | Admitting: Urology

## 2012-10-13 ENCOUNTER — Encounter (HOSPITAL_COMMUNITY): Payer: Self-pay | Admitting: *Deleted

## 2012-10-13 ENCOUNTER — Ambulatory Visit (HOSPITAL_COMMUNITY): Payer: Medicare Other | Admitting: *Deleted

## 2012-10-13 ENCOUNTER — Encounter (HOSPITAL_COMMUNITY)
Admission: RE | Disposition: A | Discharge status: Home / Self Care | Payer: Self-pay | Source: Ambulatory Visit | Attending: Urology

## 2012-10-13 DIAGNOSIS — E291 Testicular hypofunction: Secondary | ICD-10-CM | POA: Insufficient documentation

## 2012-10-13 DIAGNOSIS — C61 Malignant neoplasm of prostate: Secondary | ICD-10-CM | POA: Insufficient documentation

## 2012-10-13 DIAGNOSIS — M899 Disorder of bone, unspecified: Secondary | ICD-10-CM | POA: Insufficient documentation

## 2012-10-13 DIAGNOSIS — C7952 Secondary malignant neoplasm of bone marrow: Secondary | ICD-10-CM | POA: Insufficient documentation

## 2012-10-13 DIAGNOSIS — C7951 Secondary malignant neoplasm of bone: Secondary | ICD-10-CM | POA: Insufficient documentation

## 2012-10-13 DIAGNOSIS — N135 Crossing vessel and stricture of ureter without hydronephrosis: Secondary | ICD-10-CM | POA: Insufficient documentation

## 2012-10-13 DIAGNOSIS — Z79899 Other long term (current) drug therapy: Secondary | ICD-10-CM | POA: Insufficient documentation

## 2012-10-13 HISTORY — PX: CYSTOSCOPY W/ URETERAL STENT PLACEMENT: SHX1429

## 2012-10-13 HISTORY — DX: Cardiac murmur, unspecified: R01.1

## 2012-10-13 LAB — CBC
HCT: 35.7 % — ABNORMAL LOW (ref 39.0–52.0)
MCH: 22.4 pg — ABNORMAL LOW (ref 26.0–34.0)
MCHC: 33.1 g/dL (ref 30.0–36.0)
MCV: 67.9 fL — ABNORMAL LOW (ref 78.0–100.0)
RDW: 16.9 % — ABNORMAL HIGH (ref 11.5–15.5)

## 2012-10-13 LAB — BASIC METABOLIC PANEL
BUN: 8 mg/dL (ref 6–23)
Calcium: 7.2 mg/dL — ABNORMAL LOW (ref 8.4–10.5)
Creatinine, Ser: 0.87 mg/dL (ref 0.50–1.35)
GFR calc Af Amer: >90 mL/min (ref >90)
GFR calc non Af Amer: 84 mL/min — ABNORMAL LOW (ref >90)

## 2012-10-13 LAB — SURGICAL PCR SCREEN: Staphylococcus aureus: NEGATIVE

## 2012-10-13 SURGERY — CYSTOSCOPY, FLEXIBLE, WITH STENT REPLACEMENT
Anesthesia: General | Laterality: Left | Wound class: Clean Contaminated

## 2012-10-13 MED ORDER — LACTATED RINGERS IV SOLN: INTRAVENOUS | Status: DC

## 2012-10-13 MED ORDER — LACTATED RINGERS IV SOLN
INTRAVENOUS | Status: DC | PRN
  Administered 2012-10-13 (×2): via INTRAVENOUS

## 2012-10-13 MED ORDER — MEPERIDINE HCL 50 MG/ML IJ SOLN: 6.2500 mg | INTRAMUSCULAR | Status: DC | PRN

## 2012-10-13 MED ORDER — FENTANYL CITRATE 0.05 MG/ML IJ SOLN
INTRAMUSCULAR | Status: DC | PRN
  Administered 2012-10-13: 50 ug via INTRAVENOUS
  Administered 2012-10-13: 100 ug via INTRAVENOUS
  Administered 2012-10-13: 50 ug via INTRAVENOUS

## 2012-10-13 MED ORDER — ONDANSETRON HCL 4 MG/2ML IJ SOLN
INTRAMUSCULAR | Status: DC | PRN
  Administered 2012-10-13: 4 mg via INTRAVENOUS

## 2012-10-13 MED ORDER — CIPROFLOXACIN IN D5W 400 MG/200ML IV SOLN
400.0000 mg | INTRAVENOUS | Status: AC
  Administered 2012-10-13: 400 mg via INTRAVENOUS

## 2012-10-13 MED ORDER — MUPIROCIN 2 % EX OINT
TOPICAL_OINTMENT | Freq: Two times a day (BID) | CUTANEOUS | Status: DC
  Administered 2012-10-13: 1 via NASAL

## 2012-10-13 MED ORDER — PROMETHAZINE HCL 25 MG/ML IJ SOLN: 6.2500 mg | INTRAMUSCULAR | Status: DC | PRN

## 2012-10-13 MED ORDER — METOCLOPRAMIDE HCL 5 MG/ML IJ SOLN
INTRAMUSCULAR | Status: DC | PRN
  Administered 2012-10-13: 10 mg via INTRAVENOUS

## 2012-10-13 MED ORDER — PROPOFOL 10 MG/ML IV BOLUS
INTRAVENOUS | Status: DC | PRN
  Administered 2012-10-13: 120 mg via INTRAVENOUS

## 2012-10-13 MED ORDER — SODIUM CHLORIDE 0.9 % IR SOLN
Status: DC | PRN
  Administered 2012-10-13: 1000 mL via INTRAVESICAL

## 2012-10-13 MED ORDER — FENTANYL CITRATE 0.05 MG/ML IJ SOLN: 25.0000 ug | INTRAMUSCULAR | Status: DC | PRN

## 2012-10-13 MED ORDER — MUPIROCIN 2 % EX OINT
1.0000 "application " | TOPICAL_OINTMENT | Freq: Once | CUTANEOUS | Status: AC
  Administered 2012-10-13: 1 via NASAL
  Filled 2012-10-13: qty 22

## 2012-10-13 MED ORDER — CIPROFLOXACIN HCL 250 MG PO TABS: 250.0000 mg | ORAL_TABLET | Freq: Two times a day (BID) | ORAL | Status: DC

## 2012-10-13 MED ORDER — PHENYLEPHRINE HCL 10 MG/ML IJ SOLN
INTRAMUSCULAR | Status: DC | PRN
  Administered 2012-10-13: 120 ug via INTRAVENOUS
  Administered 2012-10-13: 80 ug via INTRAVENOUS
  Administered 2012-10-13: 160 ug via INTRAVENOUS
  Administered 2012-10-13: 80 ug via INTRAVENOUS

## 2012-10-13 MED ORDER — CIPROFLOXACIN IN D5W 400 MG/200ML IV SOLN
INTRAVENOUS | Status: AC
  Filled 2012-10-13: qty 200

## 2012-10-13 SURGICAL SUPPLY — 14 items
ADAPTER CATH URET PLST 4-6FR (CATHETERS) ×2 IMPLANT
BAG URO CATCHER STRL LF (DRAPE) ×2 IMPLANT
BASKET ZERO TIP NITINOL 2.4FR (BASKET) IMPLANT
CATH INTERMIT  6FR 70CM (CATHETERS) IMPLANT
CLOTH BEACON ORANGE TIMEOUT ST (SAFETY) ×2 IMPLANT
DRAPE CAMERA CLOSED 9X96 (DRAPES) ×2 IMPLANT
GLOVE BIOGEL M STRL SZ7.5 (GLOVE) ×2 IMPLANT
GOWN STRL NON-REIN LRG LVL3 (GOWN DISPOSABLE) ×4 IMPLANT
GUIDEWIRE ANG ZIPWIRE 038X150 (WIRE) IMPLANT
GUIDEWIRE STR DUAL SENSOR (WIRE) ×2 IMPLANT
MANIFOLD NEPTUNE II (INSTRUMENTS) ×2 IMPLANT
PACK CYSTO (CUSTOM PROCEDURE TRAY) ×2 IMPLANT
STENT CONTOUR 6FRX24X.038 (STENTS) ×2 IMPLANT
TUBING CONNECTING 10 (TUBING) ×2 IMPLANT

## 2012-10-13 NOTE — Progress Notes (Signed)
Skin under Rt arm has fallen off since his chemo tx  10/09/12. Dr. Laverle Patter notified. Very sensitive. No drainage

## 2012-10-13 NOTE — Op Note (Signed)
Preoperative diagnosis:  1. Left ureteral obstruction 2. Metastatic prostate cancer   Postoperative diagnosis:  1. Left ureteral obstruction 2. Metastatic prostate cancer   Procedure:  1. Cystoscopy 2. Left ureteral stent change (6 x 24)  Surgeon: Rolly Salter, Montez Hageman. [name]   Anesthesia: General   Complications: None   Intraoperative findings: The indwelling stent was noted to be only mildly encrusted distally.   EBL: Minimal   Specimens: None   Indication: Willie Gaines is a 72 y.o. patient with left ureteral obstruction. After reviewing the management options for treatment, he elected to proceed with the above surgical procedure(s). We have discussed the potential benefits and risks of the procedure, side effects of the proposed treatment, the likelihood of the patient achieving the goals of the procedure, and any potential problems that might occur during the procedure or recuperation. Informed consent has been obtained.   Description of procedure:   The patient was taken to the operating room and general anesthesia was induced. The patient was placed in the dorsal lithotomy position, prepped and draped in the usual sterile fashion, and preoperative antibiotics were administered. A preoperative time-out was performed.  Cystourethroscopy was performed. The patient's urethra was examined and was normal except for a mild stricture at the bladder neck which was easily navigated with the 22 Fr cystoscope sheath. The bladder was then systematically examined in its entirety. There was no evidence for any bladder tumors, stones, or other mucosal pathology.  Attention then turned to the left ureteral orifice and the patient's indwelling ureteral stent was identified and brought out to the urethral meatus with the flexible graspers.  A 0.38 sensor guidewire was then advanced up the left ureter into the renal pelvis under fluoroscopic guidance. The wire was then backloaded through the  cystoscope and a ureteral stent was advance over the wire using Seldinger technique. The stent was positioned appropriately under fluoroscopic and cystoscopic guidance. The wire was then removed with an adequate stent curl noted in the renal pelvis as well as in the bladder.  The bladder was then emptied and the procedure ended. The patient appeared to tolerate the procedure well and without complications. The patient was able to be awakened and transferred to the recovery unit in satisfactory condition.   Moody Bruins MD

## 2012-10-13 NOTE — Transfer of Care (Signed)
Immediate Anesthesia Transfer of Care Note  Patient: Willie Gaines  Procedure(s) Performed: Procedure(s): CYSTOSCOPY WITH STENT REPLACEMENT (Left)  Patient Location: PACU  Anesthesia Type:General  Level of Consciousness: awake, sedated, patient cooperative and responds to stimulation, drowsy, easily awoken, ventilating well  Airway & Oxygen Therapy: Patient Spontanous Breathing and Patient connected to face mask oxygen  Post-op Assessment: Report given to PACU RN, Post -op Vital signs reviewed and stable and Patient moving all extremities  Post vital signs: Reviewed and stable  Complications: No apparent anesthesia complications

## 2012-10-13 NOTE — Anesthesia Postprocedure Evaluation (Signed)
  Anesthesia Post-op Note  Patient: Willie Gaines  Procedure(s) Performed: Procedure(s) (LRB): CYSTOSCOPY WITH STENT REPLACEMENT (Left)  Patient Location: PACU  Anesthesia Type: General  Level of Consciousness: awake and alert   Airway and Oxygen Therapy: Patient Spontanous Breathing  Post-op Pain: mild  Post-op Assessment: Post-op Vital signs reviewed, Patient's Cardiovascular Status Stable, Respiratory Function Stable, Patent Airway and No signs of Nausea or vomiting  Last Vitals:  Filed Vitals:   10/13/12 1430  BP: 117/74  Pulse: 87  Temp:   Resp: 20    Post-op Vital Signs: stable   Complications: No apparent anesthesia complications

## 2012-10-13 NOTE — Interval H&P Note (Signed)
History and Physical Interval Note:  10/13/2012 1:17 PM  Willie Gaines  has presented today for surgery, with the diagnosis of Left Ureteral Obstruction  The various methods of treatment have been discussed with the patient and family. After consideration of risks, benefits and other options for treatment, the patient has consented to  Procedure(s): CYSTOSCOPY WITH STENT REPLACEMENT (Left) as a surgical intervention .  The patient's history has been reviewed, patient examined, no change in status, stable for surgery.  I have reviewed the patient's chart and labs.  Questions were answered to the patient's satisfaction.     Jasper Ruminski,LES

## 2012-10-13 NOTE — Anesthesia Preprocedure Evaluation (Signed)
Anesthesia Evaluation  Patient identified by MRN, date of birth, ID band Patient awake    Reviewed: Allergy & Precautions, H&P , NPO status , Patient's Chart, lab work & pertinent test results  Airway Mallampati: II TM Distance: >3 FB Neck ROM: Full    Dental no notable dental hx.    Pulmonary neg pulmonary ROS,  breath sounds clear to auscultation  Pulmonary exam normal       Cardiovascular negative cardio ROS  Rhythm:Regular Rate:Normal     Neuro/Psych negative neurological ROS  negative psych ROS   GI/Hepatic negative GI ROS, Neg liver ROS,   Endo/Other  negative endocrine ROS  Renal/GU negative Renal ROS  negative genitourinary   Musculoskeletal negative musculoskeletal ROS (+)   Abdominal   Peds negative pediatric ROS (+)  Hematology negative hematology ROS (+)   Anesthesia Other Findings   Reproductive/Obstetrics negative OB ROS                          Anesthesia Physical Anesthesia Plan  ASA: II  Anesthesia Plan: General   Post-op Pain Management:    Induction: Intravenous  Airway Management Planned: LMA  Additional Equipment:   Intra-op Plan:   Post-operative Plan:   Informed Consent: I have reviewed the patients History and Physical, chart, labs and discussed the procedure including the risks, benefits and alternatives for the proposed anesthesia with the patient or authorized representative who has indicated his/her understanding and acceptance.   Dental advisory given  Plan Discussed with: CRNA and Surgeon  Anesthesia Plan Comments:         Anesthesia Quick Evaluation  

## 2012-10-13 NOTE — Preoperative (Signed)
Beta Blockers   Reason not to administer Beta Blockers:Not Applicable, not on home BB 

## 2012-10-14 ENCOUNTER — Encounter (HOSPITAL_COMMUNITY): Payer: Self-pay | Admitting: Urology

## 2012-10-23 ENCOUNTER — Telehealth: Payer: Self-pay | Admitting: *Deleted

## 2012-10-23 ENCOUNTER — Other Ambulatory Visit (HOSPITAL_BASED_OUTPATIENT_CLINIC_OR_DEPARTMENT_OTHER): Payer: Medicare Other | Admitting: Lab

## 2012-10-23 ENCOUNTER — Ambulatory Visit: Payer: Medicare Other | Admitting: Nutrition

## 2012-10-23 ENCOUNTER — Ambulatory Visit (HOSPITAL_BASED_OUTPATIENT_CLINIC_OR_DEPARTMENT_OTHER): Payer: Medicare Other | Admitting: Oncology

## 2012-10-23 ENCOUNTER — Ambulatory Visit (HOSPITAL_BASED_OUTPATIENT_CLINIC_OR_DEPARTMENT_OTHER): Payer: Medicare Other

## 2012-10-23 ENCOUNTER — Encounter: Payer: Self-pay | Admitting: Oncology

## 2012-10-23 VITALS — BP 129/76 | HR 104 | Temp 98.1°F | Resp 18 | Ht 69.0 in | Wt 165.3 lb

## 2012-10-23 DIAGNOSIS — C7951 Secondary malignant neoplasm of bone: Secondary | ICD-10-CM

## 2012-10-23 DIAGNOSIS — Z5111 Encounter for antineoplastic chemotherapy: Secondary | ICD-10-CM

## 2012-10-23 DIAGNOSIS — C61 Malignant neoplasm of prostate: Secondary | ICD-10-CM

## 2012-10-23 DIAGNOSIS — C7952 Secondary malignant neoplasm of bone marrow: Secondary | ICD-10-CM

## 2012-10-23 DIAGNOSIS — C7949 Secondary malignant neoplasm of other parts of nervous system: Secondary | ICD-10-CM

## 2012-10-23 DIAGNOSIS — C7931 Secondary malignant neoplasm of brain: Secondary | ICD-10-CM

## 2012-10-23 DIAGNOSIS — M549 Dorsalgia, unspecified: Secondary | ICD-10-CM

## 2012-10-23 LAB — CBC WITH DIFFERENTIAL/PLATELET
BASO%: 0 % (ref 0.0–2.0)
EOS%: 0.2 % (ref 0.0–7.0)
Eosinophils Absolute: 0 10*3/uL (ref 0.0–0.5)
MCH: 22.5 pg — ABNORMAL LOW (ref 27.2–33.4)
MCHC: 32.5 g/dL (ref 32.0–36.0)
MCV: 69.4 fL — ABNORMAL LOW (ref 79.3–98.0)
MONO%: 11.6 % (ref 0.0–14.0)
NEUT#: 3 10*3/uL (ref 1.5–6.5)
RBC: 5.46 10*6/uL (ref 4.20–5.82)
RDW: 17.4 % — ABNORMAL HIGH (ref 11.0–14.6)

## 2012-10-23 LAB — COMPREHENSIVE METABOLIC PANEL (CC13)
ALT: 6 U/L (ref 0–55)
AST: 20 U/L (ref 5–34)
Albumin: 3.3 g/dL — ABNORMAL LOW (ref 3.5–5.0)
Alkaline Phosphatase: 213 U/L — ABNORMAL HIGH (ref 40–150)
Potassium: 4.1 mEq/L (ref 3.5–5.1)
Sodium: 137 mEq/L (ref 136–145)
Total Protein: 6.8 g/dL (ref 6.4–8.3)

## 2012-10-23 MED ORDER — SODIUM CHLORIDE 0.9 % IV SOLN
Freq: Once | INTRAVENOUS | Status: AC
  Administered 2012-10-23: 15:00:00 via INTRAVENOUS

## 2012-10-23 MED ORDER — HEPARIN SOD (PORK) LOCK FLUSH 100 UNIT/ML IV SOLN
500.0000 [IU] | Freq: Once | INTRAVENOUS | Status: AC | PRN
  Administered 2012-10-23: 500 [IU]
  Filled 2012-10-23: qty 5

## 2012-10-23 MED ORDER — SODIUM CHLORIDE 0.9 % IV SOLN
75.0000 mg/m2 | Freq: Once | INTRAVENOUS | Status: AC
  Administered 2012-10-23: 150 mg via INTRAVENOUS
  Filled 2012-10-23: qty 15

## 2012-10-23 MED ORDER — SODIUM CHLORIDE 0.9 % IJ SOLN
10.0000 mL | INTRAMUSCULAR | Status: DC | PRN
  Administered 2012-10-23: 10 mL
  Filled 2012-10-23: qty 10

## 2012-10-23 MED ORDER — ONDANSETRON 8 MG/50ML IVPB (CHCC)
8.0000 mg | Freq: Once | INTRAVENOUS | Status: AC
  Administered 2012-10-23: 8 mg via INTRAVENOUS

## 2012-10-23 MED ORDER — ONDANSETRON HCL 8 MG PO TABS: 8.0000 mg | ORAL_TABLET | Freq: Two times a day (BID) | ORAL | Status: DC | PRN

## 2012-10-23 MED ORDER — DEXAMETHASONE SODIUM PHOSPHATE 10 MG/ML IJ SOLN
10.0000 mg | Freq: Once | INTRAMUSCULAR | Status: AC
  Administered 2012-10-23: 10 mg via INTRAVENOUS

## 2012-10-23 MED ORDER — ZOLPIDEM TARTRATE 5 MG PO TABS: 5.0000 mg | ORAL_TABLET | Freq: Every evening | ORAL | Status: DC | PRN

## 2012-10-23 MED ORDER — NYSTATIN 100000 UNIT/GM EX CREA: TOPICAL_CREAM | CUTANEOUS | Status: AC | PRN

## 2012-10-23 NOTE — Progress Notes (Signed)
Patient is a 72 year old male diagnosed with prostate cancer.  Past medical history includes L3 spinal metastases, heart murmur.  Medications include Lipitor, calcium with vitamin D, lupron, and Zofran.  Labs include glucose of 101 on June 16.  Height: 69 inches. Weight: 165.3 pounds June 26. Usual body weight: 183 pounds 05/14/2012. BMI: 24.06.    Patient reports he has ongoing nausea and poor appetite.  Patient states he has lost 70 pounds over the last year with most of his weight loss occurring in the last 5 months.  He reports he tries to eat a healthy diet but wonders which foods he should be eating more of.  Nutrition diagnosis: Unintended weight loss related to poor appetite and nausea as evidenced by 10% weight loss over the last 6 months.  Intervention: Patient and wife were educated to increase high-calorie, high-protein foods in small amounts throughout the day. I have reviewed appropriate snack choices for patient to include.  I've recommended he set a timer or some reminder that it is time for him to eat or drink.  Patient was educated on strategies for eating with nausea and poor appetite.  Fact sheets were provided.  Patient was encouraged to add oral nutrition supplements such as ensure or boost twice a day.  Teach back method used.  Monitoring, evaluation, goals: Patient will tolerate increased calories and protein to minimize further weight loss.  Next visit.  Patient will contact me with questions or concerns.

## 2012-10-23 NOTE — Telephone Encounter (Signed)
gv and printeda ppt sched and avs fro pt....pt needed to move 7.17 tx to 7.23... dont....MW moved tx

## 2012-10-23 NOTE — Progress Notes (Signed)
Hematology and Oncology Follow Up Visit  Willie Gaines [id number] [birth year] 72 y.o. 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 Diagnosed 2009. Gleason score 4+3 = 7 PSA 9.5  Prior Therapy: He underwent a radical prostatectomy on January 01, 2008, and the pathology from that, case number [id number], by Dr. Laverle Patter showed a prostatic adenocarcinoma, Gleason score of 4+3=7, involves both lobes. Tumor extends into the extracapsular tissue, involvement of the seminal vesicle. Lymph nodes were negative, and his pathological staging of T3b N0.  The patient did very well, did received adjuvant radiation therapy due to positive margins, and PSA was undetectable post surgery.  The patient developed biochemical relapse in October of 2010 July of 2011 and presented with metastatic disease and ureteral obstruction in August of 2011. The patient started with androgen deprivation at that time as well had a stent placement The patient did well initially with hormonal deprivation up until March 2013, where he developed castration-resistant disease, again with a bone scan showed  metastatic bony involvement. The patient was enrolled in the STRIVE trial, a randomized trial for enzalutamide versus bicalutamide and after  90 days of therapy, his PSA was up to 54 and a repeat bone scan on January 03, 2012, showed a progression of disease with increase widespread skeletal metastasis.  Zytiga 1000 mg daily started on 02/01/2012 and stopped in May 2014 due to disease progression.  He is s/p EBXRT to the spine and shoulders completed on 09/26/2012.    Current therapy: Taxotere started on 10/02/2012. He is here for cycle 2 today.  Interim History:  [name] presents today for a follow up visit with his wife. Received his first cycle of Taxotere about 3 weeks ago. He is having more fatigue and generalized weakness. He reported his  pain dramatically improved. He is no longer on fentanyl patch but uses oxycodone when necessary. No neurological symptoms. He had nausea and vomiting following his chemotherapy. He was not taking his antiemetics routinely. His appetite has been decreased and he is on treatment one can of ensure per day. He has lost 7 pounds since his last visit. He is scheduled to see the dietitian later today. No leg edema. He reports right axilla skin sloughing to start right after his last cycle of chemotherapy. This area has recently been radiated.   Medications: I have reviewed the patient's current medications. Current Outpatient Prescriptions  Medication Sig Dispense Refill  . acetaminophen (TYLENOL) 500 MG tablet Take 1,000 mg by mouth every 6 (six) hours as needed for pain. Pt uses OTC for pain      . atorvastatin (LIPITOR) 10 MG tablet Take 10 mg by mouth at bedtime. Patient currently taking as of 10/06/12.      . Calcium Citrate-Vitamin D (CITRACAL + D PO) Take 2 tablets by mouth every morning. Patient currently taking as of 10/06/12.      . ciprofloxacin (CIPRO) 250 MG tablet Take 1 tablet (250 mg total) by mouth 2 (two) times daily.  4 tablet  0  . denosumab (XGEVA) 120 MG/1.7ML SOLN Inject 120 mg into the skin every 30 (thirty) days.      [name] Leuprolide Acetate (LUPRON IJ) Inject as directed every 6 (six) months.       . lidocaine-prilocaine (EMLA) cream Apply 1 application topically daily as needed (Applies to port-a-cath.).      [name] nystatin cream (MYCOSTATIN) Apply topically as needed for dry  skin. Apply to left axilla twice a day as needed.  30 g  1  . ondansetron (ZOFRAN) 8 MG tablet Take 1 tablet (8 mg total) by mouth every 12 (twelve) hours as needed for nausea.  20 tablet  2  . oxyCODONE (OXY IR/ROXICODONE) 5 MG immediate release tablet Take 5-10 mg by mouth every 4 (four) hours as needed for pain. PAIN      . zolpidem (AMBIEN) 5 MG tablet Take 1 tablet (5 mg total) by mouth at bedtime as needed for  sleep.  30 tablet  1   No current facility-administered medications for this visit.  .  Allergies:  Allergies  Allergen Reactions  . Other     SOFT SHELL CRAB-HIVES ALL OTHER    Past Medical History, Surgical history, Social history, and Family History were reviewed and updated.  Review of Systems: Constitutional:  Negative for fever, chills, night sweats, anorexia. Cardiovascular: no chest pain or dyspnea on exertion Respiratory: negative Neurological: negative Dermatological: negative ENT: negative Skin: Negative. Gastrointestinal: negative Genito-Urinary: negative Hematological and Lymphatic: negative Breast: negative Musculoskeletal: negative Remaining ROS negative.  Physical Exam: Blood pressure 129/76, pulse 104, temperature 98.1 F (36.7 C), temperature source Oral, resp. rate 18, height 5\' 9"  (1.753 m), weight 165 lb 4.8 oz (74.98 kg), SpO2 100.00%. ECOG: 0 General appearance: alert Head: Normocephalic, without obvious abnormality, atraumatic Neck: no adenopathy, no carotid bruit, no JVD, supple, symmetrical, trachea midline and thyroid not enlarged, symmetric, no tenderness/mass/nodules Lymph nodes: Cervical, supraclavicular, and axillary nodes normal. Heart:regular rate and rhythm, S1, S2 normal, no murmur, click, rub or gallop Lung:chest clear, no wheezing, rales, normal symmetric air entry Abdomen: soft, non-tender, without masses or organomegaly EXT:no erythema, induration, or nodules   Lab Results: Lab Results  Component Value Date   WBC 4.4 10/23/2012   HGB 12.3* 10/23/2012   HCT 37.9* 10/23/2012   MCV 69.4* 10/23/2012   PLT 199 10/23/2012     Chemistry      Component Value Date/Time   NA 137 10/23/2012 1319   NA 137 10/13/2012 1324   K 4.1 10/23/2012 1319   K 4.3 10/13/2012 1324   CL 107 10/13/2012 1324   CL 107 10/02/2012 1255   CO2 21* 10/23/2012 1319   CO2 20 10/13/2012 1324   BUN 14.0 10/23/2012 1319   BUN 8 10/13/2012 1324   CREATININE 1.1  10/23/2012 1319   CREATININE 0.87 10/13/2012 1324      Component Value Date/Time   CALCIUM 8.5 10/23/2012 1319   CALCIUM 7.2* 10/13/2012 1324   ALKPHOS 213* 10/23/2012 1319   ALKPHOS 215* 02/09/2012 0935   AST 20 10/23/2012 1319   AST 18 02/09/2012 0935   ALT 6 10/23/2012 1319   ALT 9 02/09/2012 0935   BILITOT 0.33 10/23/2012 1319   BILITOT 0.3 02/09/2012 0935     Results for JASKARN, SCHWEER (MRN [id number]) as of 10/23/2012 13:46  Ref. Range 05/14/2012 08:37 06/19/2012 09:23 07/23/2012 09:27 08/21/2012 09:19 09/19/2012 09:56  PSA Latest Range: <=4.00 ng/mL 62.21 (H) 63.01 (H) 82.53 (H) 96.86 (H) 101.50 (H)   Impression and Plan: This is a 72 year old gentleman with the following issues:   1. Castration-resistant prostate cancer. He is on Zytiga.  His PSA dropped from 83 to 58 but has gone up to 101.50. His CT scan and bone scan showed clear progression not only in the bone but possible the liver.  He is now on Taxotere. Recommend that he proceed with cycle 2 without dose  modification. PSA is pending today.  2. Back pain and L3 involvement of his cancer. Status post radiation therapy. Pain is much better and using oxycodone only as needed.   3. Hormonal deprivation. He continues to be on Lupron at [facility].   4. Bone-directed therapy. I have recommended continue Xgeva at this time. He is receiving both Lupron and Xgeva at Dr. Vevelyn Royals office, and I told him that I will be happy to continue those here if he would like, but for the time being it does not really matter where he gets it as long as he is receiving both post treatments.   5. Neutropenia prophylaxis: Neulasta will be added to each cycle.   6. IV access: Port-A-Cath in place.  7. nausea vomiting prophylaxis. He has Zofran and Compazine at home. I have refilled his Zofran today I have instructed him to use this every 8 hours for 3 days following chemotherapy.  8. right axilla candidiasis. I have prescribed nystatin cream.  9.  Follow up: 3 weeks for cycle 3 of chemotherapy.    [place], [place] 6/26/20142:33 PM

## 2012-10-23 NOTE — Patient Instructions (Signed)
Results for Willie Gaines, Willie Gaines (MRN [id number]) as of 10/23/2012 13:46  Ref. Range 05/14/2012 08:37 06/19/2012 09:23 07/23/2012 09:27 08/21/2012 09:19 09/19/2012 09:56  PSA Latest Range: <=4.00 ng/mL 62.21 (H) 63.01 (H) 82.53 (H) 96.86 (H) 101.50 (H)

## 2012-10-23 NOTE — Telephone Encounter (Signed)
Per staff phone I have moved appt from 7/17 to 7/23.  JMW

## 2012-10-24 ENCOUNTER — Ambulatory Visit (HOSPITAL_BASED_OUTPATIENT_CLINIC_OR_DEPARTMENT_OTHER): Payer: Medicare Other

## 2012-10-24 VITALS — BP 130/78 | HR 99 | Temp 98.3°F

## 2012-10-24 DIAGNOSIS — Z5189 Encounter for other specified aftercare: Secondary | ICD-10-CM

## 2012-10-24 DIAGNOSIS — C61 Malignant neoplasm of prostate: Secondary | ICD-10-CM

## 2012-10-24 DIAGNOSIS — C7951 Secondary malignant neoplasm of bone: Secondary | ICD-10-CM

## 2012-10-24 MED ORDER — PEGFILGRASTIM INJECTION 6 MG/0.6ML
6.0000 mg | Freq: Once | SUBCUTANEOUS | Status: AC
  Administered 2012-10-24: 6 mg via SUBCUTANEOUS
  Filled 2012-10-24: qty 0.6

## 2012-10-30 ENCOUNTER — Encounter: Payer: Self-pay | Admitting: Radiation Oncology

## 2012-10-30 ENCOUNTER — Ambulatory Visit
Admission: RE | Admit: 2012-10-30 | Discharge: 2012-10-30 | Disposition: A | Discharge status: Home / Self Care | Payer: Medicare Other | Source: Ambulatory Visit | Attending: Radiation Oncology | Admitting: Radiation Oncology

## 2012-10-30 VITALS — BP 123/86 | HR 100 | Temp 98.2°F | Resp 16 | Wt 164.3 lb

## 2012-10-30 DIAGNOSIS — C7931 Secondary malignant neoplasm of brain: Secondary | ICD-10-CM

## 2012-10-30 NOTE — Progress Notes (Signed)
Radiation Oncology         [phone] ________________________________  Name: Willie Gaines MRN: [id number]  Date: 10/30/2012  DOB: March 21, 1941  Follow-Up Visit Note  CC: Lolita Patella, MD  Benjiman Core, MD  Diagnosis:   72 year old gentleman with castration resistant metastatic prostate cancer status post palliative radiotherapy - 09/08/2012-09/24/2012:  1. The lumbar spine including L2-L4 was treated to 30 Gy in 12 fractions of 2.5 Gy   2. The left shoulder was treated to 30 Gy in 10 fractions of 3 Gy   3. The right shoulder was treated to 30 Gy in 10 fractions of 3 Gy  Interval Since Last Radiation:  1 months  Narrative:  The patient returns today for routine follow-up.  His presenting low back pain and shoulder pain has entirely resolved. The patient did develop skin reactions related to his bilateral shoulder treatment.  ALLERGIES:  is allergic to other.  Meds: Current Outpatient Prescriptions  Medication Sig Dispense Refill  . acetaminophen (TYLENOL) 500 MG tablet Take 1,000 mg by mouth every 6 (six) hours as needed for pain. Pt uses OTC for pain      . atorvastatin (LIPITOR) 10 MG tablet Take 10 mg by mouth at bedtime. Patient currently taking as of 10/06/12.      . Calcium Citrate-Vitamin D (CITRACAL + D PO) Take 2 tablets by mouth every morning. Patient currently taking as of 10/06/12.      [name] denosumab (XGEVA) 120 MG/1.7ML SOLN Inject 120 mg into the skin every 30 (thirty) days.      [name] Leuprolide Acetate (LUPRON IJ) Inject as directed every 6 (six) months.       . lidocaine-prilocaine (EMLA) cream Apply 1 application topically daily as needed (Applies to port-a-cath.).      [name] nystatin cream (MYCOSTATIN) Apply topically as needed for dry skin. Apply to left axilla twice a day as needed.  30 g  1  . ondansetron (ZOFRAN) 8 MG tablet Take 1 tablet (8 mg total) by mouth every 12 (twelve) hours as needed for nausea.  20 tablet  2  . oxyCODONE (OXY IR/ROXICODONE) 5 MG  immediate release tablet Take 5-10 mg by mouth every 4 (four) hours as needed for pain. PAIN      . zolpidem (AMBIEN) 5 MG tablet Take 1 tablet (5 mg total) by mouth at bedtime as needed for sleep.  30 tablet  1  . ciprofloxacin (CIPRO) 250 MG tablet Take 1 tablet (250 mg total) by mouth 2 (two) times daily.  4 tablet  0   No current facility-administered medications for this encounter.    Physical Findings: The patient is in no acute distress. Patient is alert and oriented.  weight is 164 lb 4.8 oz (74.526 kg). His oral temperature is 98.2 F (36.8 C). His blood pressure is 123/86 and his pulse is 100. His respiration is 16. [name]  His left anterior shoulder shows a small spot of darker pigmentation with no desquamation. The right axilla shows diffuse hyperpigmentation with status post dry desquamation.                      Lab Findings: Lab Results  Component Value Date   WBC 4.4 10/23/2012   HGB 12.3* 10/23/2012   HCT 37.9* 10/23/2012   MCV 69.4* 10/23/2012   PLT 199 10/23/2012    @LASTCHEM @  Radiographic Findings: Ir Fluoro Guide Cv Line Right  09/30/2012   *RADIOLOGY REPORT*  Clinical Data: Metastatic  prostate cancer.  FLUOROSCOPIC AND ULTRASOUND GUIDED PLACEMENT OF A SUBCUTANEOUS PORT.  Physician: Rachelle Hora. Henn, MD  Medications:Versed 4 mg, Fentanyl 200 mcg. A radiology nurse monitored the patient for moderate sedation. Ancef 2 gm.  As antibiotic prophylaxis, Ancef  was ordered pre-procedure and administered intravenously within one hour of incision.  Moderate sedation time:36 minutes  Fluoroscopy time: 18 seconds  Procedure:  The risks of the procedure were explained to the patient.  Informed consent was obtained.  Patient was placed supine on the interventional table.  Ultrasound confirmed a patent right internal jugular vein.  The right chest and neck were cleaned with a skin antiseptic and a sterile drape was placed.  Maximal barrier sterile technique was utilized including caps, mask,  sterile gowns, sterile gloves, sterile drape, hand hygiene and skin antiseptic. The right neck was anesthetized with 1% lidocaine.  Small incision was made in the right neck with a blade.  Micropuncture set was placed in the right IJ with ultrasound guidance.  The micropuncture wire was used for measurement purposes.  The right chest was anesthetized with 1% lidocaine with epinephrine.  #15 blade was used to make an incision and a subcutaneous port pocket was formed. 8 french Power Port was assembled.  Subcutaneous tunnel was formed with a stiff tunneling device.  The port catheter was brought through the subcutaneous tunnel.  The port was placed in the subcutaneous pocket.  The micropuncture set was exchanged for a peel-away sheath.  The catheter was placed through the peel-away sheath and the tip was positioned at the junction of the SVC and right atrium.  Catheter placement was confirmed with fluoroscopy. The port was accessed and flushed with heparinized saline.  The port pocket was closed using two layers of absorbable sutures and Dermabond.  The vein skin site was closed using a single layer of absorbable suture and Dermabond.  Sterile dressings were applied. Patient tolerated the procedure well without an immediate complication.  Ultrasound and fluoroscopic images were taken and saved for this procedure.  Complications: None  Impression:  Placement of a subcutaneous port device.  The catheter tip is at the junction of the SVC and right atrium and ready to be used.   Original Report Authenticated By: Richarda Overlie, [name]   Ir US Guide Vasc Access Left  09/30/2012   *RADIOLOGY REPORT*  Clinical Data: Metastatic prostate cancer.  FLUOROSCOPIC AND ULTRASOUND GUIDED PLACEMENT OF A SUBCUTANEOUS PORT.  Physician: Rachelle Hora. Henn, MD  Medications:Versed 4 mg, Fentanyl 200 mcg. A radiology nurse monitored the patient for moderate sedation. Ancef 2 gm.  As antibiotic prophylaxis, Ancef  was ordered pre-procedure and  administered intravenously within one hour of incision.  Moderate sedation time:36 minutes  Fluoroscopy time: 18 seconds  Procedure:  The risks of the procedure were explained to the patient.  Informed consent was obtained.  Patient was placed supine on the interventional table.  Ultrasound confirmed a patent right internal jugular vein.  The right chest and neck were cleaned with a skin antiseptic and a sterile drape was placed.  Maximal barrier sterile technique was utilized including caps, mask, sterile gowns, sterile gloves, sterile drape, hand hygiene and skin antiseptic. The right neck was anesthetized with 1% lidocaine.  Small incision was made in the right neck with a blade.  Micropuncture set was placed in the right IJ with ultrasound guidance.  The micropuncture wire was used for measurement purposes.  The right chest was anesthetized with 1% lidocaine with epinephrine.  #15  blade was used to make an incision and a subcutaneous port pocket was formed. 8 french Power Port was assembled.  Subcutaneous tunnel was formed with a stiff tunneling device.  The port catheter was brought through the subcutaneous tunnel.  The port was placed in the subcutaneous pocket.  The micropuncture set was exchanged for a peel-away sheath.  The catheter was placed through the peel-away sheath and the tip was positioned at the junction of the SVC and right atrium.  Catheter placement was confirmed with fluoroscopy. The port was accessed and flushed with heparinized saline.  The port pocket was closed using two layers of absorbable sutures and Dermabond.  The vein skin site was closed using a single layer of absorbable suture and Dermabond.  Sterile dressings were applied. Patient tolerated the procedure well without an immediate complication.  Ultrasound and fluoroscopic images were taken and saved for this procedure.  Complications: None  Impression:  Placement of a subcutaneous port device.  The catheter tip is at the junction  of the SVC and right atrium and ready to be used.   Original Report Authenticated By: Richarda Overlie, [name]    Impression:  The patient is recovering from the effects of radiation.  He achieved palliation of pain at the treated sites. His GI discomfort related lumbar radiotherapy during his course irradiation resolved after completion.  Plan:  The patient will return to radiation oncology clinic in the future on an as-needed basis. Talked a little bit today about the potential for localized courses of radiation for isolated bone metastases or possibly radium infusions for diffuse disease progression.  _____________________________________  [name], [name]

## 2012-10-30 NOTE — Progress Notes (Signed)
Reports decreased appetite and weight loss of 10 lb since 09/19/2012. Reports that following chemotherapy he broke out under each arm and skill has skin changes there as a result. Reports stent was changes week and a half ago and leakage has stopped. Reports getting up once during the night to void on average. Denies burning or frequency with urination. Reports last lupron injection was two months ago. Denies back or shoulder pain. Denies weakness, numbness or tingling to extremities. Reports fatigue.

## 2012-11-13 ENCOUNTER — Ambulatory Visit: Payer: Self-pay | Admitting: Oncology

## 2012-11-13 ENCOUNTER — Other Ambulatory Visit: Payer: Self-pay | Admitting: Lab

## 2012-11-13 ENCOUNTER — Ambulatory Visit: Payer: Self-pay

## 2012-11-14 ENCOUNTER — Ambulatory Visit: Payer: Self-pay

## 2012-11-19 ENCOUNTER — Telehealth: Payer: Self-pay | Admitting: Oncology

## 2012-11-19 ENCOUNTER — Other Ambulatory Visit (HOSPITAL_BASED_OUTPATIENT_CLINIC_OR_DEPARTMENT_OTHER): Payer: Medicare Other | Admitting: Lab

## 2012-11-19 ENCOUNTER — Ambulatory Visit (HOSPITAL_BASED_OUTPATIENT_CLINIC_OR_DEPARTMENT_OTHER): Payer: Medicare Other

## 2012-11-19 ENCOUNTER — Telehealth: Payer: Self-pay | Admitting: *Deleted

## 2012-11-19 ENCOUNTER — Ambulatory Visit (HOSPITAL_BASED_OUTPATIENT_CLINIC_OR_DEPARTMENT_OTHER): Payer: Medicare Other | Admitting: Oncology

## 2012-11-19 VITALS — BP 158/88 | HR 85 | Temp 98.1°F | Resp 18 | Ht 69.0 in | Wt 169.7 lb

## 2012-11-19 DIAGNOSIS — Z5111 Encounter for antineoplastic chemotherapy: Secondary | ICD-10-CM

## 2012-11-19 DIAGNOSIS — M549 Dorsalgia, unspecified: Secondary | ICD-10-CM

## 2012-11-19 DIAGNOSIS — C7952 Secondary malignant neoplasm of bone marrow: Secondary | ICD-10-CM

## 2012-11-19 DIAGNOSIS — C7951 Secondary malignant neoplasm of bone: Secondary | ICD-10-CM

## 2012-11-19 DIAGNOSIS — C7931 Secondary malignant neoplasm of brain: Secondary | ICD-10-CM

## 2012-11-19 DIAGNOSIS — C61 Malignant neoplasm of prostate: Secondary | ICD-10-CM

## 2012-11-19 LAB — COMPREHENSIVE METABOLIC PANEL (CC13)
ALT: 6 U/L (ref 0–55)
AST: 15 U/L (ref 5–34)
BUN: 7.5 mg/dL (ref 7.0–26.0)
CO2: 21 mEq/L — ABNORMAL LOW (ref 22–29)
Creatinine: 0.9 mg/dL (ref 0.7–1.3)
Total Bilirubin: 0.33 mg/dL (ref 0.20–1.20)

## 2012-11-19 LAB — PSA: PSA: 129.2 ng/mL — ABNORMAL HIGH (ref <=4.00)

## 2012-11-19 LAB — CBC WITH DIFFERENTIAL/PLATELET
BASO%: 0 % (ref 0.0–2.0)
LYMPH%: 22.9 % (ref 14.0–49.0)
MCHC: 31.9 g/dL — ABNORMAL LOW (ref 32.0–36.0)
MCV: 72 fL — ABNORMAL LOW (ref 79.3–98.0)
MONO%: 11.8 % (ref 0.0–14.0)
NEUT%: 61.9 % (ref 39.0–75.0)
Platelets: 186 10*3/uL (ref 140–400)
RBC: 5 10*6/uL (ref 4.20–5.82)
nRBC: 0 % (ref 0–0)

## 2012-11-19 MED ORDER — DOCETAXEL CHEMO INJECTION 160 MG/16ML
75.0000 mg/m2 | Freq: Once | INTRAVENOUS | Status: AC
  Administered 2012-11-19: 150 mg via INTRAVENOUS
  Filled 2012-11-19: qty 15

## 2012-11-19 MED ORDER — HEPARIN SOD (PORK) LOCK FLUSH 100 UNIT/ML IV SOLN
500.0000 [IU] | Freq: Once | INTRAVENOUS | Status: AC | PRN
  Administered 2012-11-19: 500 [IU]
  Filled 2012-11-19: qty 5

## 2012-11-19 MED ORDER — SODIUM CHLORIDE 0.9 % IJ SOLN
10.0000 mL | INTRAMUSCULAR | Status: DC | PRN
  Administered 2012-11-19: 10 mL
  Filled 2012-11-19: qty 10

## 2012-11-19 MED ORDER — DEXAMETHASONE SODIUM PHOSPHATE 10 MG/ML IJ SOLN
10.0000 mg | Freq: Once | INTRAMUSCULAR | Status: AC
  Administered 2012-11-19: 10 mg via INTRAVENOUS

## 2012-11-19 MED ORDER — ONDANSETRON 8 MG/50ML IVPB (CHCC)
8.0000 mg | Freq: Once | INTRAVENOUS | Status: AC
  Administered 2012-11-19: 8 mg via INTRAVENOUS

## 2012-11-19 MED ORDER — SODIUM CHLORIDE 0.9 % IV SOLN
Freq: Once | INTRAVENOUS | Status: AC
  Administered 2012-11-19: 14:00:00 via INTRAVENOUS

## 2012-11-19 NOTE — Telephone Encounter (Signed)
Gave pt appt for lab, MD, ML and emailed Marcelino Duster regarding chemo on August 2014

## 2012-11-19 NOTE — Telephone Encounter (Signed)
Per staff message and POF I have scheduled appts.  JMW  

## 2012-11-19 NOTE — Telephone Encounter (Signed)
Called pt and left message regarding lab, md and chemo for August 2014

## 2012-11-19 NOTE — Patient Instructions (Addendum)
[facility] Discharge Instructions for Patients Receiving Chemotherapy  Today you received the following chemotherapy agents: Taxotere  To help prevent nausea and vomiting after your treatment, we encourage you to take your nausea medication as prescribed.    If you develop nausea and vomiting that is not controlled by your nausea medication, call the clinic.   BELOW ARE SYMPTOMS THAT SHOULD BE REPORTED IMMEDIATELY:  *FEVER GREATER THAN 100.5 F  *CHILLS WITH OR WITHOUT FEVER  NAUSEA AND VOMITING THAT IS NOT CONTROLLED WITH YOUR NAUSEA MEDICATION  *UNUSUAL SHORTNESS OF BREATH  *UNUSUAL BRUISING OR BLEEDING  TENDERNESS IN MOUTH AND THROAT WITH OR WITHOUT PRESENCE OF ULCERS  *URINARY PROBLEMS  *BOWEL PROBLEMS  UNUSUAL RASH Items with * indicate a potential emergency and should be followed up as soon as possible.  Feel free to call the clinic you have any questions or concerns. The clinic phone number is [phone].    

## 2012-11-19 NOTE — Progress Notes (Signed)
Hematology and Oncology Follow Up Visit  Willie Gaines [id number] 1941-01-31 72 y.o. 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 Diagnosed 2009. Gleason score 4+3 = 7 PSA 9.5  Prior Therapy: He underwent a radical prostatectomy on January 01, 2008, and the pathology from that, case number [id number], by Dr. Laverle Patter showed a prostatic adenocarcinoma, Gleason score of 4+3=7, involves both lobes. Tumor extends into the extracapsular tissue, involvement of the seminal vesicle. Lymph nodes were negative, and his pathological staging of T3b N0.  The patient did very well, did received adjuvant radiation therapy due to positive margins, and PSA was undetectable post surgery.  The patient developed biochemical relapse in October of 2010 July of 2011 and presented with metastatic disease and ureteral obstruction in August of 2011. The patient started with androgen deprivation at that time as well had a stent placement The patient did well initially with hormonal deprivation up until March 2013, where he developed castration-resistant disease, again with a bone scan showed  metastatic bony involvement. The patient was enrolled in the STRIVE trial, a randomized trial for enzalutamide versus bicalutamide and after  90 days of therapy, his PSA was up to 54 and a repeat bone scan on January 03, 2012, showed a progression of disease with increase widespread skeletal metastasis.  Zytiga 1000 mg daily started on 02/01/2012 and stopped in May 2014 due to disease progression.  He is s/p EBXRT to the spine and shoulders completed on 09/26/2012.    Current therapy: Taxotere started on 10/02/2012. He is here for cycle 3 today.  Interim History:  Mr. Kall presents today for a follow up visit with his wife. Received his first 2 cycles of Taxotere without major complications. He is having more fatigue and generalized weakness. He  reported his pain dramatically improved. He is no longer on fentanyl patch but uses oxycodone when necessary. No neurological symptoms. He had nausea and vomiting following his chemotherapy. He was not taking his antiemetics routinely. No leg edema. He reports right shoulder skin sloughing to start right after his last cycle of chemotherapy. This happened as well after his first cycle as well but it involved the right axilla. He uses steroid cream with good relief.    Medications: I have reviewed the patient's current medications. Current Outpatient Prescriptions  Medication Sig Dispense Refill  . acetaminophen (TYLENOL) 500 MG tablet Take 1,000 mg by mouth every 6 (six) hours as needed for pain. Pt uses OTC for pain      . atorvastatin (LIPITOR) 10 MG tablet Take 10 mg by mouth at bedtime. Patient currently taking as of 10/06/12.      . Calcium Citrate-Vitamin D (CITRACAL + D PO) Take 2 tablets by mouth every morning. Patient currently taking as of 10/06/12.      . ciprofloxacin (CIPRO) 250 MG tablet Take 1 tablet (250 mg total) by mouth 2 (two) times daily.  4 tablet  0  . denosumab (XGEVA) 120 MG/1.7ML SOLN Inject 120 mg into the skin every 30 (thirty) days.      [name] Leuprolide Acetate (LUPRON IJ) Inject as directed every 6 (six) months.       . lidocaine-prilocaine (EMLA) cream Apply 1 application topically daily as needed (Applies to port-a-cath.).      [name] nystatin cream (MYCOSTATIN) Apply topically as needed for dry skin. Apply to left axilla twice a day as needed.  30 g  1  .  ondansetron (ZOFRAN) 8 MG tablet Take 1 tablet (8 mg total) by mouth every 12 (twelve) hours as needed for nausea.  20 tablet  2  . oxyCODONE (OXY IR/ROXICODONE) 5 MG immediate release tablet Take 5-10 mg by mouth every 4 (four) hours as needed for pain. PAIN      . zolpidem (AMBIEN) 5 MG tablet Take 1 tablet (5 mg total) by mouth at bedtime as needed for sleep.  30 tablet  1   No current facility-administered medications for  this visit.  .  Allergies:  Allergies  Allergen Reactions  . Other     SOFT SHELL CRAB-HIVES ALL OTHER    Past Medical History, Surgical history, Social history, and Family History were reviewed and updated.  Review of Systems: Constitutional:  Negative for fever, chills, night sweats, anorexia. Cardiovascular: no chest pain or dyspnea on exertion Respiratory: negative Neurological: negative Dermatological: negative ENT: negative Skin: Negative. Gastrointestinal: negative Genito-Urinary: negative Hematological and Lymphatic: negative Breast: negative Musculoskeletal: negative Remaining ROS negative.  Physical Exam: Blood pressure 158/88, pulse 85, temperature 98.1 F (36.7 C), temperature source Oral, resp. rate 18, height 5\' 9"  (1.753 m), weight 169 lb 11.2 oz (76.975 kg). ECOG: 0 General appearance: alert Head: Normocephalic, without obvious abnormality, atraumatic Neck: no adenopathy, no carotid bruit, no JVD, supple, symmetrical, trachea midline and thyroid not enlarged, symmetric, no tenderness/mass/nodules Lymph nodes: Cervical, supraclavicular, and axillary nodes normal. Heart:regular rate and rhythm, S1, S2 normal, no murmur, click, rub or gallop Lung:chest clear, no wheezing, rales, normal symmetric air entry Abdomen: soft, non-tender, without masses or organomegaly EXT:no erythema, induration, or nodules Skin: right should discoloration noted.   Lab Results: Lab Results  Component Value Date   WBC 4.7 11/19/2012   HGB 11.5* 11/19/2012   HCT 36.0* 11/19/2012   MCV 72.0* 11/19/2012   PLT 186 11/19/2012     Chemistry      Component Value Date/Time   NA 137 10/23/2012 1319   NA 137 10/13/2012 1324   K 4.1 10/23/2012 1319   K 4.3 10/13/2012 1324   CL 107 10/13/2012 1324   CL 107 10/02/2012 1255   CO2 21* 10/23/2012 1319   CO2 20 10/13/2012 1324   BUN 14.0 10/23/2012 1319   BUN 8 10/13/2012 1324   CREATININE 1.1 10/23/2012 1319   CREATININE 0.87 10/13/2012 1324       Component Value Date/Time   CALCIUM 8.5 10/23/2012 1319   CALCIUM 7.2* 10/13/2012 1324   ALKPHOS 213* 10/23/2012 1319   ALKPHOS 215* 02/09/2012 0935   AST 20 10/23/2012 1319   AST 18 02/09/2012 0935   ALT 6 10/23/2012 1319   ALT 9 02/09/2012 0935   BILITOT 0.33 10/23/2012 1319   BILITOT 0.3 02/09/2012 0935     Results for ADRICK, KESTLER (MRN [id number]) as of 11/19/2012 11:29  Ref. Range 09/19/2012 09:56 10/23/2012 13:20  PSA Latest Range: <=4.00 ng/mL 101.50 (H) 169.70 (H)    Impression and Plan: This is a 72 year old gentleman with the following issues:   1. Castration-resistant prostate cancer. He is on Zytiga.  His PSA dropped from 83 to 58 but has gone up to 169.7. Recommend that he proceed with cycle 3 without dose modification. PSA is pending today.  2. Back pain and L3 involvement of his cancer. Status post radiation therapy. Pain is much better and using oxycodone only as needed.   3. Hormonal deprivation. He continues to be on Lupron at [facility].   4. Bone-directed therapy. I  have recommended continue Xgeva at this time. He is receiving both Lupron and Xgeva at Dr. Vevelyn Royals office, and I told him that I will be happy to continue those here if he would like, but for the time being it does not really matter where he gets it as long as he is receiving both post treatments.   5. Neutropenia prophylaxis: Neulasta will be added to each cycle.   6. IV access: Port-A-Cath in place.  7. nausea vomiting prophylaxis. He has Zofran and Compazine at home.  8. Right axilla/Shoulder discoloration. ? Radiation recall vs. Candida infection. I advised him to continue steroid cream.  9. Follow up: 3 weeks for cycle 4 of chemotherapy.    [facility] 7/23/201412:14 PM

## 2012-11-20 ENCOUNTER — Telehealth: Payer: Self-pay | Admitting: *Deleted

## 2012-11-20 ENCOUNTER — Ambulatory Visit (HOSPITAL_BASED_OUTPATIENT_CLINIC_OR_DEPARTMENT_OTHER): Payer: Medicare Other

## 2012-11-20 VITALS — BP 171/85 | HR 95 | Temp 98.4°F

## 2012-11-20 DIAGNOSIS — Z5189 Encounter for other specified aftercare: Secondary | ICD-10-CM

## 2012-11-20 DIAGNOSIS — C61 Malignant neoplasm of prostate: Secondary | ICD-10-CM

## 2012-11-20 DIAGNOSIS — C7951 Secondary malignant neoplasm of bone: Secondary | ICD-10-CM

## 2012-11-20 MED ORDER — PEGFILGRASTIM INJECTION 6 MG/0.6ML
6.0000 mg | Freq: Once | SUBCUTANEOUS | Status: AC
  Administered 2012-11-20: 6 mg via SUBCUTANEOUS
  Filled 2012-11-20: qty 0.6

## 2012-11-20 NOTE — Telephone Encounter (Signed)
Message copied by Reesa Chew on Thu Nov 20, 2012  9:52 AM ------      Message from: Benjiman Core      Created: Thu Nov 20, 2012  8:37 AM       Please call his PSA. Down this time. ------

## 2012-12-04 ENCOUNTER — Ambulatory Visit: Payer: Self-pay

## 2012-12-05 ENCOUNTER — Ambulatory Visit: Payer: Self-pay

## 2012-12-08 ENCOUNTER — Telehealth: Payer: Self-pay | Admitting: *Deleted

## 2012-12-08 NOTE — Telephone Encounter (Signed)
No note

## 2012-12-10 ENCOUNTER — Other Ambulatory Visit (HOSPITAL_BASED_OUTPATIENT_CLINIC_OR_DEPARTMENT_OTHER): Payer: Medicare Other | Admitting: Lab

## 2012-12-10 ENCOUNTER — Ambulatory Visit (HOSPITAL_BASED_OUTPATIENT_CLINIC_OR_DEPARTMENT_OTHER): Payer: Medicare Other

## 2012-12-10 ENCOUNTER — Ambulatory Visit (HOSPITAL_BASED_OUTPATIENT_CLINIC_OR_DEPARTMENT_OTHER): Payer: Medicare Other | Admitting: Oncology

## 2012-12-10 ENCOUNTER — Encounter: Payer: Self-pay | Admitting: Oncology

## 2012-12-10 VITALS — BP 129/88 | HR 105 | Temp 98.1°F | Resp 19 | Ht 69.0 in | Wt 167.0 lb

## 2012-12-10 DIAGNOSIS — C7951 Secondary malignant neoplasm of bone: Secondary | ICD-10-CM

## 2012-12-10 DIAGNOSIS — C61 Malignant neoplasm of prostate: Secondary | ICD-10-CM

## 2012-12-10 DIAGNOSIS — F411 Generalized anxiety disorder: Secondary | ICD-10-CM

## 2012-12-10 DIAGNOSIS — Z5111 Encounter for antineoplastic chemotherapy: Secondary | ICD-10-CM

## 2012-12-10 DIAGNOSIS — C7952 Secondary malignant neoplasm of bone marrow: Secondary | ICD-10-CM

## 2012-12-10 DIAGNOSIS — E291 Testicular hypofunction: Secondary | ICD-10-CM

## 2012-12-10 LAB — COMPREHENSIVE METABOLIC PANEL (CC13)
Albumin: 3.3 g/dL — ABNORMAL LOW (ref 3.5–5.0)
BUN: 13.8 mg/dL (ref 7.0–26.0)
CO2: 20 mEq/L — ABNORMAL LOW (ref 22–29)
Calcium: 8.2 mg/dL — ABNORMAL LOW (ref 8.4–10.4)
Chloride: 109 mEq/L (ref 98–109)
Glucose: 108 mg/dl (ref 70–140)
Potassium: 4.8 mEq/L (ref 3.5–5.1)
Sodium: 138 mEq/L (ref 136–145)
Total Protein: 6.9 g/dL (ref 6.4–8.3)

## 2012-12-10 LAB — CBC WITH DIFFERENTIAL/PLATELET
Basophils Absolute: 0 10*3/uL (ref 0.0–0.1)
EOS%: 0.4 % (ref 0.0–7.0)
Eosinophils Absolute: 0 10*3/uL (ref 0.0–0.5)
HCT: 36.4 % — ABNORMAL LOW (ref 38.4–49.9)
HGB: 11.7 g/dL — ABNORMAL LOW (ref 13.0–17.1)
MCH: 23.4 pg — ABNORMAL LOW (ref 27.2–33.4)
NEUT#: 3.2 10*3/uL (ref 1.5–6.5)
NEUT%: 61.5 % (ref 39.0–75.0)
RDW: 18.4 % — ABNORMAL HIGH (ref 11.0–14.6)
lymph#: 1.2 10*3/uL (ref 0.9–3.3)

## 2012-12-10 LAB — PSA: PSA: 162.3 ng/mL — ABNORMAL HIGH (ref <=4.00)

## 2012-12-10 MED ORDER — HEPARIN SOD (PORK) LOCK FLUSH 100 UNIT/ML IV SOLN
500.0000 [IU] | Freq: Once | INTRAVENOUS | Status: AC | PRN
  Administered 2012-12-10: 500 [IU]
  Filled 2012-12-10: qty 5

## 2012-12-10 MED ORDER — LORAZEPAM 2 MG/ML IJ SOLN
0.5000 mg | Freq: Once | INTRAMUSCULAR | Status: AC
  Administered 2012-12-10: 0.5 mg via INTRAVENOUS

## 2012-12-10 MED ORDER — SODIUM CHLORIDE 0.9 % IJ SOLN
10.0000 mL | INTRAMUSCULAR | Status: DC | PRN
  Administered 2012-12-10: 10 mL
  Filled 2012-12-10: qty 10

## 2012-12-10 MED ORDER — DOCETAXEL CHEMO INJECTION 160 MG/16ML
75.0000 mg/m2 | Freq: Once | INTRAVENOUS | Status: AC
  Administered 2012-12-10: 150 mg via INTRAVENOUS
  Filled 2012-12-10: qty 15

## 2012-12-10 MED ORDER — SODIUM CHLORIDE 0.9 % IV SOLN
Freq: Once | INTRAVENOUS | Status: AC
  Administered 2012-12-10: 10:00:00 via INTRAVENOUS

## 2012-12-10 MED ORDER — LORAZEPAM 0.5 MG PO TABS: 0.5000 mg | ORAL_TABLET | Freq: Three times a day (TID) | ORAL | Status: DC

## 2012-12-10 MED ORDER — DEXAMETHASONE SODIUM PHOSPHATE 10 MG/ML IJ SOLN
10.0000 mg | Freq: Once | INTRAMUSCULAR | Status: AC
  Administered 2012-12-10: 10 mg via INTRAVENOUS

## 2012-12-10 MED ORDER — LORAZEPAM 0.5 MG PO TABS: 0.5000 mg | ORAL_TABLET | Freq: Three times a day (TID) | ORAL | Status: AC | PRN

## 2012-12-10 MED ORDER — ONDANSETRON HCL 8 MG PO TABS: 8.0000 mg | ORAL_TABLET | Freq: Two times a day (BID) | ORAL | Status: AC | PRN

## 2012-12-10 MED ORDER — ONDANSETRON 8 MG/50ML IVPB (CHCC)
8.0000 mg | Freq: Once | INTRAVENOUS | Status: AC
  Administered 2012-12-10: 8 mg via INTRAVENOUS

## 2012-12-10 NOTE — Patient Instructions (Addendum)
[facility] Discharge Instructions for Patients Receiving Chemotherapy  Today you received the following chemotherapy agents: Taxotere  To help prevent nausea and vomiting after your treatment, we encourage you to take your nausea medication as prescribed.    If you develop nausea and vomiting that is not controlled by your nausea medication, call the clinic.   BELOW ARE SYMPTOMS THAT SHOULD BE REPORTED IMMEDIATELY:  *FEVER GREATER THAN 100.5 F  *CHILLS WITH OR WITHOUT FEVER  NAUSEA AND VOMITING THAT IS NOT CONTROLLED WITH YOUR NAUSEA MEDICATION  *UNUSUAL SHORTNESS OF BREATH  *UNUSUAL BRUISING OR BLEEDING  TENDERNESS IN MOUTH AND THROAT WITH OR WITHOUT PRESENCE OF ULCERS  *URINARY PROBLEMS  *BOWEL PROBLEMS  UNUSUAL RASH Items with * indicate a potential emergency and should be followed up as soon as possible.  Feel free to call the clinic you have any questions or concerns. The clinic phone number is [phone].    

## 2012-12-10 NOTE — Progress Notes (Signed)
Hematology and Oncology Follow Up Visit  Willie Gaines [id number] November 06, 1940 72 y.o. 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 Diagnosed 2009. Gleason score 4+3 = 7 PSA 9.5  Prior Therapy: He underwent a radical prostatectomy on January 01, 2008, and the pathology from that, case number [id number], by Dr. Laverle Patter showed a prostatic adenocarcinoma, Gleason score of 4+3=7, involves both lobes. Tumor extends into the extracapsular tissue, involvement of the seminal vesicle. Lymph nodes were negative, and his pathological staging of T3b N0.  The patient did very well, did received adjuvant radiation therapy due to positive margins, and PSA was undetectable post surgery.  The patient developed biochemical relapse in October of 2010 July of 2011 and presented with metastatic disease and ureteral obstruction in August of 2011. The patient started with androgen deprivation at that time as well had a stent placement The patient did well initially with hormonal deprivation up until March 2013, where he developed castration-resistant disease, again with a bone scan showed  metastatic bony involvement. The patient was enrolled in the STRIVE trial, a randomized trial for enzalutamide versus bicalutamide and after  90 days of therapy, his PSA was up to 54 and a repeat bone scan on January 03, 2012, showed a progression of disease with increase widespread skeletal metastasis.  Zytiga 1000 mg daily started on 02/01/2012 and stopped in May 2014 due to disease progression.  He is s/p EBXRT to the spine and shoulders completed on 09/26/2012.    Current therapy: Taxotere started on 10/02/2012. He is here for cycle 4 today.  Interim History:  Willie Gaines presents today for a follow up visit with his wife. Received his first 3 cycles of Taxotere without major complications. He is having more fatigue and generalized weakness. He  reported his pain dramatically improved. He is no longer on fentanyl patch but uses oxycodone when necessary. No neurological symptoms. He had nausea and vomiting following his chemotherapy. He was not taking his antiemetics routinely. No leg edema. Reports that he feels anxious at times.  Medications: I have reviewed the patient's current medications. Current Outpatient Prescriptions  Medication Sig Dispense Refill  . acetaminophen (TYLENOL) 500 MG tablet Take 1,000 mg by mouth every 6 (six) hours as needed for pain. Pt uses OTC for pain      . atorvastatin (LIPITOR) 10 MG tablet Take 10 mg by mouth at bedtime. Patient currently taking as of 10/06/12.      . Calcium Citrate-Vitamin D (CITRACAL + D PO) Take 2 tablets by mouth every morning. Patient currently taking as of 10/06/12.      [name] denosumab (XGEVA) 120 MG/1.7ML SOLN Inject 120 mg into the skin every 30 (thirty) days.      [name] Leuprolide Acetate (LUPRON IJ) Inject as directed every 6 (six) months.       . lidocaine-prilocaine (EMLA) cream Apply 1 application topically daily as needed (Applies to port-a-cath.).      [name] LORazepam (ATIVAN) 0.5 MG tablet Take 1 tablet (0.5 mg total) by mouth every 8 (eight) hours as needed (Nausea/vomiting/anxiety).  30 tablet  0  . nystatin cream (MYCOSTATIN) Apply topically as needed for dry skin. Apply to left axilla twice a day as needed.  30 g  1  . ondansetron (ZOFRAN) 8 MG tablet Take 1 tablet (8 mg total) by mouth every 12 (twelve) hours as needed for nausea.  20 tablet  2  . oxyCODONE (  OXY IR/ROXICODONE) 5 MG immediate release tablet Take 5-10 mg by mouth every 4 (four) hours as needed for pain. PAIN      . zolpidem (AMBIEN) 5 MG tablet Take 1 tablet (5 mg total) by mouth at bedtime as needed for sleep.  30 tablet  1   No current facility-administered medications for this visit.   Facility-Administered Medications Ordered in Other Visits  Medication Dose Route Frequency Provider Last Rate Last Dose  .  dexamethasone (DECADRON) injection 10 mg  10 mg Intravenous Once Benjiman Core, MD      . DOCEtaxel (TAXOTERE) 150 mg in dextrose 5 % 250 mL chemo infusion  75 mg/m2 (Treatment Plan Actual) Intravenous Once Benjiman Core, MD      . heparin lock flush 100 unit/mL  500 Units Intracatheter Once PRN Benjiman Core, MD      . ondansetron (ZOFRAN) IVPB 8 mg  8 mg Intravenous Once Benjiman Core, MD      . sodium chloride 0.9 % injection 10 mL  10 mL Intracatheter PRN Benjiman Core, MD      .  Allergies:  Allergies  Allergen Reactions  . Other     SOFT SHELL CRAB-HIVES ALL OTHER    Past Medical History, Surgical history, Social history, and Family History were reviewed and updated.  Review of Systems: Constitutional:  Negative for fever, chills, night sweats, anorexia. Cardiovascular: no chest pain or dyspnea on exertion Respiratory: negative Neurological: negative Dermatological: negative ENT: negative Skin: Negative. Gastrointestinal: negative Genito-Urinary: negative Hematological and Lymphatic: negative Breast: negative Musculoskeletal: negative Remaining ROS negative.  Physical Exam: Blood pressure 129/88, pulse 105, temperature 98.1 F (36.7 C), temperature source Oral, resp. rate 19, height 5\' 9"  (1.753 m), weight 167 lb (75.751 kg). ECOG: 0 General appearance: alert Head: Normocephalic, without obvious abnormality, atraumatic Neck: no adenopathy, no carotid bruit, no JVD, supple, symmetrical, trachea midline and thyroid not enlarged, symmetric, no tenderness/mass/nodules Lymph nodes: Cervical, supraclavicular, and axillary nodes normal. Heart:regular rate and rhythm, S1, S2 normal, no murmur, click, rub or gallop Lung:chest clear, no wheezing, rales, normal symmetric air entry Abdomen: soft, non-tender, without masses or organomegaly EXT:no erythema, induration, or nodules Skin: right should discoloration noted.   Lab Results: Lab Results  Component Value Date   WBC  5.3 12/10/2012   HGB 11.7* 12/10/2012   HCT 36.4* 12/10/2012   MCV 72.9* 12/10/2012   PLT 240 12/10/2012     Chemistry      Component Value Date/Time   NA 140 11/19/2012 1135   NA 137 10/13/2012 1324   K 4.1 11/19/2012 1135   K 4.3 10/13/2012 1324   CL 107 10/13/2012 1324   CL 107 10/02/2012 1255   CO2 21* 11/19/2012 1135   CO2 20 10/13/2012 1324   BUN 7.5 11/19/2012 1135   BUN 8 10/13/2012 1324   CREATININE 0.9 11/19/2012 1135   CREATININE 0.87 10/13/2012 1324      Component Value Date/Time   CALCIUM 8.6 11/19/2012 1135   CALCIUM 7.2* 10/13/2012 1324   ALKPHOS 148 11/19/2012 1135   ALKPHOS 215* 02/09/2012 0935   AST 15 11/19/2012 1135   AST 18 02/09/2012 0935   ALT 6 11/19/2012 1135   ALT 9 02/09/2012 0935   BILITOT 0.33 11/19/2012 1135   BILITOT 0.3 02/09/2012 0935     Results for DIERRE, CREVIER (MRN [id number]) as of 12/10/2012 08:47  Ref. Range 07/23/2012 09:27 08/21/2012 09:19 09/19/2012 09:56 10/23/2012 13:20 11/19/2012 11:36  PSA Latest Range: <=4.00 ng/mL 82.53 (H) 96.86 (H) 101.50 (H) 169.70 (H) 129.20 (H)    Impression and Plan: This is a 72 year old gentleman with the following issues:   1. Castration-resistant prostate cancer. He is on Zytiga.  His PSA dropped from 83 to 58 but has gone up to 169.7. PSA back down to 129 last month. Recommend that he proceed with cycle 4 without dose modification. PSA is pending today.  2. Back pain and L3 involvement of his cancer. Status post radiation therapy. Pain is much better and using oxycodone only as needed.   3. Hormonal deprivation. He continues to be on Lupron at [facility].   4. Bone-directed therapy. I have recommended continue Xgeva at this time. He is receiving both Lupron and Xgeva at Dr. Vevelyn Royals office, and I told him that I will be happy to continue those here if he would like, but for the time being it does not really matter where he gets it as long as he is receiving both post treatments.   5. Neutropenia prophylaxis:  Neulasta will be added to each cycle.   6. IV access: Port-A-Cath in place.  7. nausea vomiting prophylaxis. He has Zofran and Compazine at home.  8. Right axilla/Shoulder discoloration. Resolved.  9. Anxiety: He will receive IV Ativan with chemo today. I have given him a prescription for Ativan to be used at home as needed.  10. Follow up: 3 weeks for cycle 5 of chemotherapy.    [place], [place] 8/13/201410:16 AM

## 2012-12-11 ENCOUNTER — Ambulatory Visit (HOSPITAL_BASED_OUTPATIENT_CLINIC_OR_DEPARTMENT_OTHER): Payer: Medicare Other

## 2012-12-11 ENCOUNTER — Encounter: Payer: Self-pay | Admitting: *Deleted

## 2012-12-11 ENCOUNTER — Encounter: Payer: Self-pay | Admitting: Oncology

## 2012-12-11 VITALS — BP 146/80 | HR 99 | Temp 98.5°F

## 2012-12-11 DIAGNOSIS — Z5189 Encounter for other specified aftercare: Secondary | ICD-10-CM

## 2012-12-11 DIAGNOSIS — C61 Malignant neoplasm of prostate: Secondary | ICD-10-CM

## 2012-12-11 DIAGNOSIS — C7951 Secondary malignant neoplasm of bone: Secondary | ICD-10-CM

## 2012-12-11 MED ORDER — PEGFILGRASTIM INJECTION 6 MG/0.6ML
6.0000 mg | Freq: Once | SUBCUTANEOUS | Status: AC
  Administered 2012-12-11: 6 mg via SUBCUTANEOUS
  Filled 2012-12-11: qty 0.6

## 2012-12-11 NOTE — Progress Notes (Signed)
Patient was given his PSA result by Liborio Nixon, injection nurse today, when he rec'd neulasta

## 2012-12-11 NOTE — Progress Notes (Signed)
RECEIVED A FAX FROM [facility] CONCERNING A PRIOR AUTHORIZATION FOR LORAZEPAM. THIS REQUEST WAS PLACED IN THE MANAGED CARE BIN. 

## 2012-12-11 NOTE — Progress Notes (Signed)
[facility], [id number], approved lorazepam 0.5mg  from 11/11/12-12/11/13.

## 2012-12-31 ENCOUNTER — Ambulatory Visit (HOSPITAL_BASED_OUTPATIENT_CLINIC_OR_DEPARTMENT_OTHER): Payer: Medicare Other

## 2012-12-31 ENCOUNTER — Telehealth: Payer: Self-pay | Admitting: *Deleted

## 2012-12-31 ENCOUNTER — Ambulatory Visit (HOSPITAL_BASED_OUTPATIENT_CLINIC_OR_DEPARTMENT_OTHER): Payer: Medicare Other | Admitting: Oncology

## 2012-12-31 ENCOUNTER — Telehealth: Payer: Self-pay | Admitting: Oncology

## 2012-12-31 ENCOUNTER — Other Ambulatory Visit (HOSPITAL_BASED_OUTPATIENT_CLINIC_OR_DEPARTMENT_OTHER): Payer: Medicare Other | Admitting: Lab

## 2012-12-31 VITALS — BP 137/75 | HR 89 | Temp 98.6°F | Resp 18 | Ht 69.0 in | Wt 170.4 lb

## 2012-12-31 DIAGNOSIS — E291 Testicular hypofunction: Secondary | ICD-10-CM

## 2012-12-31 DIAGNOSIS — C7951 Secondary malignant neoplasm of bone: Secondary | ICD-10-CM

## 2012-12-31 DIAGNOSIS — C61 Malignant neoplasm of prostate: Secondary | ICD-10-CM

## 2012-12-31 DIAGNOSIS — Z5111 Encounter for antineoplastic chemotherapy: Secondary | ICD-10-CM

## 2012-12-31 DIAGNOSIS — G893 Neoplasm related pain (acute) (chronic): Secondary | ICD-10-CM

## 2012-12-31 LAB — COMPREHENSIVE METABOLIC PANEL (CC13)
Albumin: 3 g/dL — ABNORMAL LOW (ref 3.5–5.0)
BUN: 14.4 mg/dL (ref 7.0–26.0)
Calcium: 8.1 mg/dL — ABNORMAL LOW (ref 8.4–10.4)
Chloride: 111 mEq/L — ABNORMAL HIGH (ref 98–109)
Creatinine: 0.8 mg/dL (ref 0.7–1.3)
Glucose: 111 mg/dl (ref 70–140)
Potassium: 3.8 mEq/L (ref 3.5–5.1)

## 2012-12-31 LAB — CBC WITH DIFFERENTIAL/PLATELET
BASO%: 0.2 % (ref 0.0–2.0)
Basophils Absolute: 0 10*3/uL (ref 0.0–0.1)
EOS%: 1 % (ref 0.0–7.0)
HCT: 33.5 % — ABNORMAL LOW (ref 38.4–49.9)
HGB: 10.6 g/dL — ABNORMAL LOW (ref 13.0–17.1)
LYMPH%: 20.5 % (ref 14.0–49.0)
MCH: 23.7 pg — ABNORMAL LOW (ref 27.2–33.4)
MCHC: 31.6 g/dL — ABNORMAL LOW (ref 32.0–36.0)
NEUT%: 66.4 % (ref 39.0–75.0)
Platelets: 212 10*3/uL (ref 140–400)
lymph#: 1.1 10*3/uL (ref 0.9–3.3)

## 2012-12-31 MED ORDER — DEXAMETHASONE SODIUM PHOSPHATE 10 MG/ML IJ SOLN
10.0000 mg | Freq: Once | INTRAMUSCULAR | Status: AC
  Administered 2012-12-31: 10 mg via INTRAVENOUS

## 2012-12-31 MED ORDER — ONDANSETRON 8 MG/50ML IVPB (CHCC)
8.0000 mg | Freq: Once | INTRAVENOUS | Status: AC
  Administered 2012-12-31: 8 mg via INTRAVENOUS

## 2012-12-31 MED ORDER — SODIUM CHLORIDE 0.9 % IV SOLN
Freq: Once | INTRAVENOUS | Status: AC
  Administered 2012-12-31: 10:00:00 via INTRAVENOUS

## 2012-12-31 MED ORDER — DOCETAXEL CHEMO INJECTION 160 MG/16ML
75.0000 mg/m2 | Freq: Once | INTRAVENOUS | Status: AC
  Administered 2012-12-31: 150 mg via INTRAVENOUS
  Filled 2012-12-31: qty 15

## 2012-12-31 MED ORDER — LORAZEPAM 2 MG/ML IJ SOLN
0.5000 mg | Freq: Once | INTRAMUSCULAR | Status: AC
  Administered 2012-12-31: 0.5 mg via INTRAVENOUS

## 2012-12-31 MED ORDER — HEPARIN SOD (PORK) LOCK FLUSH 100 UNIT/ML IV SOLN
500.0000 [IU] | Freq: Once | INTRAVENOUS | Status: AC | PRN
  Administered 2012-12-31: 500 [IU]
  Filled 2012-12-31: qty 5

## 2012-12-31 MED ORDER — SODIUM CHLORIDE 0.9 % IJ SOLN
10.0000 mL | INTRAMUSCULAR | Status: DC | PRN
  Administered 2012-12-31: 10 mL
  Filled 2012-12-31: qty 10

## 2012-12-31 NOTE — Progress Notes (Signed)
Hematology and Oncology Follow Up Visit  GREGG WINCHELL [id number] February 26, 1941 72 y.o. 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 Diagnosed 2009. Gleason score 4+3 = 7 PSA 9.5  Prior Therapy: He underwent a radical prostatectomy on January 01, 2008, and the pathology from that, case number [id number], by Dr. Laverle Patter showed a prostatic adenocarcinoma, Gleason score of 4+3=7, involves both lobes. Tumor extends into the extracapsular tissue, involvement of the seminal vesicle. Lymph nodes were negative, and his pathological staging of T3b N0.  The patient did very well, did received adjuvant radiation therapy due to positive margins, and PSA was undetectable post surgery.  The patient developed biochemical relapse in October of 2010 July of 2011 and presented with metastatic disease and ureteral obstruction in August of 2011. The patient started with androgen deprivation at that time as well had a stent placement The patient did well initially with hormonal deprivation up until March 2013, where he developed castration-resistant disease, again with a bone scan showed  metastatic bony involvement. The patient was enrolled in the STRIVE trial, a randomized trial for enzalutamide versus bicalutamide and after  90 days of therapy, his PSA was up to 54 and a repeat bone scan on January 03, 2012, showed a progression of disease with increase widespread skeletal metastasis.  Zytiga 1000 mg daily started on 02/01/2012 and stopped in May 2014 due to disease progression.  He is s/p EBXRT to the spine and shoulders completed on 09/26/2012.    Current therapy: Taxotere started on 10/02/2012. He is here for cycle 5 today.  Interim History:  Mr. Tetreault presents today for a follow up visit with his wife. Received his first 4 cycles of Taxotere without major complications. He is having more fatigue and generalized weakness but  these have stabilized. He reported his pain dramatically improved. He is no longer on fentanyl patch but uses oxycodone when necessary. No neurological symptoms. He had nausea and vomiting following his chemotherapy. He was not taking his antiemetics routinely. No leg edema. Faint grade one neuropathy noted.  Medications: I have reviewed the patient's current medications. Current Outpatient Prescriptions  Medication Sig Dispense Refill  . acetaminophen (TYLENOL) 500 MG tablet Take 1,000 mg by mouth every 6 (six) hours as needed for pain. Pt uses OTC for pain      . atorvastatin (LIPITOR) 10 MG tablet Take 10 mg by mouth at bedtime. Patient currently taking as of 10/06/12.      . Calcium Citrate-Vitamin D (CITRACAL + D PO) Take 2 tablets by mouth every morning. Patient currently taking as of 10/06/12.      [name] denosumab (XGEVA) 120 MG/1.7ML SOLN Inject 120 mg into the skin every 30 (thirty) days.      [name] Leuprolide Acetate (LUPRON IJ) Inject as directed every 6 (six) months.       . lidocaine-prilocaine (EMLA) cream Apply 1 application topically daily as needed (Applies to port-a-cath.).      [name] LORazepam (ATIVAN) 0.5 MG tablet Take 1 tablet (0.5 mg total) by mouth every 8 (eight) hours as needed (Nausea/vomiting/anxiety).  30 tablet  0  . nystatin cream (MYCOSTATIN) Apply topically as needed for dry skin. Apply to left axilla twice a day as needed.  30 g  1  . ondansetron (ZOFRAN) 8 MG tablet Take 1 tablet (8 mg total) by mouth every 12 (twelve) hours as needed for nausea.  20 tablet  2  .  oxyCODONE (OXY IR/ROXICODONE) 5 MG immediate release tablet Take 5-10 mg by mouth every 4 (four) hours as needed for pain. PAIN      . zolpidem (AMBIEN) 5 MG tablet Take 1 tablet (5 mg total) by mouth at bedtime as needed for sleep.  30 tablet  1   No current facility-administered medications for this visit.  .  Allergies:  Allergies  Allergen Reactions  . Other     SOFT SHELL CRAB-HIVES ALL OTHER    Past Medical  History, Surgical history, Social history, and Family History were reviewed and updated.  Review of Systems:  Remaining ROS negative.  Physical Exam: Blood pressure 137/75, pulse 89, temperature 98.6 F (37 C), temperature source Oral, resp. rate 18, height 5\' 9"  (1.753 m), weight 170 lb 6.4 oz (77.293 kg), SpO2 100.00%. ECOG: 1 General appearance: alert Head: Normocephalic, without obvious abnormality, atraumatic Neck: no adenopathy, no carotid bruit, no JVD, supple, symmetrical, trachea midline and thyroid not enlarged, symmetric, no tenderness/mass/nodules Lymph nodes: Cervical, supraclavicular, and axillary nodes normal. Heart:regular rate and rhythm, S1, S2 normal, no murmur, click, rub or gallop Lung:chest clear, no wheezing, rales, normal symmetric air entry Abdomen: soft, non-tender, without masses or organomegaly EXT:no erythema, induration, or nodules Skin: right should discoloration noted.   Lab Results: Lab Results  Component Value Date   WBC 5.2 12/31/2012   HGB 10.6* 12/31/2012   HCT 33.5* 12/31/2012   MCV 74.9* 12/31/2012   PLT 212 12/31/2012     Chemistry      Component Value Date/Time   NA 138 12/10/2012 0848   NA 137 10/13/2012 1324   K 4.8 12/10/2012 0848   K 4.3 10/13/2012 1324   CL 107 10/13/2012 1324   CL 107 10/02/2012 1255   CO2 20* 12/10/2012 0848   CO2 20 10/13/2012 1324   BUN 13.8 12/10/2012 0848   BUN 8 10/13/2012 1324   CREATININE 1.0 12/10/2012 0848   CREATININE 0.87 10/13/2012 1324      Component Value Date/Time   CALCIUM 8.2* 12/10/2012 0848   CALCIUM 7.2* 10/13/2012 1324   ALKPHOS 161* 12/10/2012 0848   ALKPHOS 215* 02/09/2012 0935   AST 16 12/10/2012 0848   AST 18 02/09/2012 0935   ALT <6 12/10/2012 0848   ALT 9 02/09/2012 0935   BILITOT 0.43 12/10/2012 0848   BILITOT 0.3 02/09/2012 0935      Results for KARION, CUDD (MRN [id number]) as of 12/31/2012 09:25  Ref. Range 10/23/2012 13:20 11/19/2012 11:36 12/10/2012 08:48  PSA Latest Range: <=4.00 ng/mL  169.70 (H) 129.20 (H) 162.30 (H)    Impression and Plan: This is a 72 year old gentleman with the following issues:   1. Castration-resistant prostate cancer. He is on Taxotere.  His PSA dropped from 83 to 58 but has gone up to 169.7. PSA back down to 129  Then up again last month. Recommend that he proceed with cycle 5 without dose modification. PSA is pending today. If PSA continues to rise, we will switch treatments.   2. Back pain and L3 involvement of his cancer. Status post radiation therapy. Pain is much better and using oxycodone only as needed.   3. Hormonal deprivation. He continues to be on Lupron at [facility].   4. Bone-directed therapy. I have recommended continue Xgeva at this time. He is receiving both Lupron and Xgeva at Dr. Vevelyn Royals office, and I told him that I will be happy to continue those here if he would like, but for the  time being it does not really matter where he gets it as long as he is receiving both post treatments.   5. Neutropenia prophylaxis: Neulasta will be added to each cycle.   6. IV access: Port-A-Cath in place.  7. nausea vomiting prophylaxis. He has Zofran and Compazine at home.  8. Follow up: 3 weeks for cycle 5 of chemotherapy.    Ayrianna Mcginniss 9/3/20149:38 AM

## 2012-12-31 NOTE — Telephone Encounter (Signed)
gv and printed appt sched and avs for pt...emaield MW to add tx.   °

## 2012-12-31 NOTE — Patient Instructions (Addendum)
[facility] Discharge Instructions for Patients Receiving Chemotherapy  Today you received the following chemotherapy agents: Taxotere  To help prevent nausea and vomiting after your treatment, we encourage you to take your nausea medication as prescribed.    If you develop nausea and vomiting that is not controlled by your nausea medication, call the clinic.   BELOW ARE SYMPTOMS THAT SHOULD BE REPORTED IMMEDIATELY:  *FEVER GREATER THAN 100.5 F  *CHILLS WITH OR WITHOUT FEVER  NAUSEA AND VOMITING THAT IS NOT CONTROLLED WITH YOUR NAUSEA MEDICATION  *UNUSUAL SHORTNESS OF BREATH  *UNUSUAL BRUISING OR BLEEDING  TENDERNESS IN MOUTH AND THROAT WITH OR WITHOUT PRESENCE OF ULCERS  *URINARY PROBLEMS  *BOWEL PROBLEMS  UNUSUAL RASH Items with * indicate a potential emergency and should be followed up as soon as possible.  Feel free to call the clinic you have any questions or concerns. The clinic phone number is [phone].    

## 2012-12-31 NOTE — Telephone Encounter (Signed)
Per staff message and POF I have scheduled appts.  JMW  

## 2013-01-01 ENCOUNTER — Telehealth: Payer: Self-pay

## 2013-01-01 ENCOUNTER — Ambulatory Visit (HOSPITAL_BASED_OUTPATIENT_CLINIC_OR_DEPARTMENT_OTHER): Payer: Medicare Other

## 2013-01-01 VITALS — BP 141/84 | HR 88 | Temp 98.8°F

## 2013-01-01 DIAGNOSIS — C61 Malignant neoplasm of prostate: Secondary | ICD-10-CM

## 2013-01-01 DIAGNOSIS — C7951 Secondary malignant neoplasm of bone: Secondary | ICD-10-CM

## 2013-01-01 MED ORDER — PEGFILGRASTIM INJECTION 6 MG/0.6ML
6.0000 mg | Freq: Once | SUBCUTANEOUS | Status: AC
  Administered 2013-01-01: 6 mg via SUBCUTANEOUS
  Filled 2013-01-01: qty 0.6

## 2013-01-01 NOTE — Telephone Encounter (Signed)
Message copied by Kallie Locks on Thu Jan 01, 2013 12:58 PM ------      Message from: Benjiman Core      Created: Thu Jan 01, 2013  9:38 AM       Please call his PSA. ------

## 2013-01-21 ENCOUNTER — Telehealth: Payer: Self-pay | Admitting: Oncology

## 2013-01-21 ENCOUNTER — Ambulatory Visit (HOSPITAL_BASED_OUTPATIENT_CLINIC_OR_DEPARTMENT_OTHER): Payer: Medicare Other | Admitting: Oncology

## 2013-01-21 ENCOUNTER — Ambulatory Visit (HOSPITAL_BASED_OUTPATIENT_CLINIC_OR_DEPARTMENT_OTHER): Payer: Medicare Other

## 2013-01-21 ENCOUNTER — Other Ambulatory Visit: Payer: Self-pay | Admitting: *Deleted

## 2013-01-21 ENCOUNTER — Other Ambulatory Visit (HOSPITAL_BASED_OUTPATIENT_CLINIC_OR_DEPARTMENT_OTHER): Payer: Medicare Other | Admitting: Lab

## 2013-01-21 VITALS — BP 139/79 | HR 99 | Temp 97.2°F | Resp 20 | Ht 69.0 in | Wt 169.2 lb

## 2013-01-21 DIAGNOSIS — C61 Malignant neoplasm of prostate: Secondary | ICD-10-CM

## 2013-01-21 DIAGNOSIS — Z5111 Encounter for antineoplastic chemotherapy: Secondary | ICD-10-CM

## 2013-01-21 LAB — COMPREHENSIVE METABOLIC PANEL (CC13)
AST: 17 U/L (ref 5–34)
Alkaline Phosphatase: 167 U/L — ABNORMAL HIGH (ref 40–150)
BUN: 13.1 mg/dL (ref 7.0–26.0)
Calcium: 8.3 mg/dL — ABNORMAL LOW (ref 8.4–10.4)
Creatinine: 0.9 mg/dL (ref 0.7–1.3)

## 2013-01-21 LAB — CBC WITH DIFFERENTIAL/PLATELET
Basophils Absolute: 0 10*3/uL (ref 0.0–0.1)
EOS%: 0.2 % (ref 0.0–7.0)
HCT: 35.7 % — ABNORMAL LOW (ref 38.4–49.9)
HGB: 11.4 g/dL — ABNORMAL LOW (ref 13.0–17.1)
MCH: 23.9 pg — ABNORMAL LOW (ref 27.2–33.4)
MCV: 75 fL — ABNORMAL LOW (ref 79.3–98.0)
MONO%: 15.2 % — ABNORMAL HIGH (ref 0.0–14.0)
NEUT%: 68.7 % (ref 39.0–75.0)
Platelets: 217 10*3/uL (ref 140–400)

## 2013-01-21 MED ORDER — ONDANSETRON 8 MG/50ML IVPB (CHCC)
8.0000 mg | Freq: Once | INTRAVENOUS | Status: AC
  Administered 2013-01-21: 8 mg via INTRAVENOUS

## 2013-01-21 MED ORDER — DEXAMETHASONE SODIUM PHOSPHATE 10 MG/ML IJ SOLN
INTRAMUSCULAR | Status: AC
  Filled 2013-01-21: qty 1

## 2013-01-21 MED ORDER — ONDANSETRON 8 MG/NS 50 ML IVPB
INTRAVENOUS | Status: AC
  Filled 2013-01-21: qty 8

## 2013-01-21 MED ORDER — DEXAMETHASONE SODIUM PHOSPHATE 10 MG/ML IJ SOLN
10.0000 mg | Freq: Once | INTRAMUSCULAR | Status: AC
  Administered 2013-01-21: 10 mg via INTRAVENOUS

## 2013-01-21 MED ORDER — SODIUM CHLORIDE 0.9 % IJ SOLN
10.0000 mL | INTRAMUSCULAR | Status: DC | PRN
  Administered 2013-01-21: 10 mL
  Filled 2013-01-21: qty 10

## 2013-01-21 MED ORDER — OXYCODONE HCL 5 MG PO TABS: 5.0000 mg | ORAL_TABLET | ORAL | Status: AC | PRN

## 2013-01-21 MED ORDER — LORAZEPAM 2 MG/ML IJ SOLN
INTRAMUSCULAR | Status: AC
  Filled 2013-01-21: qty 1

## 2013-01-21 MED ORDER — SODIUM CHLORIDE 0.9 % IV SOLN
Freq: Once | INTRAVENOUS | Status: AC
  Administered 2013-01-21: 14:00:00 via INTRAVENOUS

## 2013-01-21 MED ORDER — DOCETAXEL CHEMO INJECTION 160 MG/16ML
75.0000 mg/m2 | Freq: Once | INTRAVENOUS | Status: AC
  Administered 2013-01-21: 150 mg via INTRAVENOUS
  Filled 2013-01-21: qty 15

## 2013-01-21 MED ORDER — HEPARIN SOD (PORK) LOCK FLUSH 100 UNIT/ML IV SOLN
500.0000 [IU] | Freq: Once | INTRAVENOUS | Status: AC | PRN
  Administered 2013-01-21: 500 [IU]
  Filled 2013-01-21: qty 5

## 2013-01-21 MED ORDER — LORAZEPAM 2 MG/ML IJ SOLN
0.5000 mg | Freq: Once | INTRAMUSCULAR | Status: AC
  Administered 2013-01-21: 0.5 mg via INTRAVENOUS

## 2013-01-21 NOTE — Patient Instructions (Addendum)
[facility] Discharge Instructions for Patients Receiving Chemotherapy  Today you received the following chemotherapy agents: Taxotere  To help prevent nausea and vomiting after your treatment, we encourage you to take your nausea medication as prescribed.    If you develop nausea and vomiting that is not controlled by your nausea medication, call the clinic.   BELOW ARE SYMPTOMS THAT SHOULD BE REPORTED IMMEDIATELY:  *FEVER GREATER THAN 100.5 F  *CHILLS WITH OR WITHOUT FEVER  NAUSEA AND VOMITING THAT IS NOT CONTROLLED WITH YOUR NAUSEA MEDICATION  *UNUSUAL SHORTNESS OF BREATH  *UNUSUAL BRUISING OR BLEEDING  TENDERNESS IN MOUTH AND THROAT WITH OR WITHOUT PRESENCE OF ULCERS  *URINARY PROBLEMS  *BOWEL PROBLEMS  UNUSUAL RASH Items with * indicate a potential emergency and should be followed up as soon as possible.  Feel free to call the clinic you have any questions or concerns. The clinic phone number is [phone].    

## 2013-01-21 NOTE — Telephone Encounter (Signed)
Per staff message and POF I have scheduled appts.  JMW  

## 2013-01-21 NOTE — Telephone Encounter (Signed)
Called pt and left message regarding lab,md and chemo for Sept,October and november 2014

## 2013-01-21 NOTE — Progress Notes (Signed)
Hematology and Oncology Follow Up Visit  Willie Gaines [id number] [birth year] 72 y.o. 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 Diagnosed 2009. Gleason score 4+3 = 7 PSA 9.5  Prior Therapy: He underwent a radical prostatectomy on January 01, 2008, and the pathology from that, case number [id number], by Dr. Laverle Gaines showed a prostatic adenocarcinoma, Gleason score of 4+3=7, involves both lobes. Tumor extends into the extracapsular tissue, involvement of the seminal vesicle. Lymph nodes were negative, and his pathological staging of T3b N0.  The patient did very well, did received adjuvant radiation therapy due to positive margins, and PSA was undetectable post surgery.  The patient developed biochemical relapse in October of 2010 July of 2011 and presented with metastatic disease and ureteral obstruction in August of 2011. The patient started with androgen deprivation at that time as well had a stent placement The patient did well initially with hormonal deprivation up until March 2013, where he developed castration-resistant disease, again with a bone scan showed  metastatic bony involvement. The patient was enrolled in the STRIVE trial, a randomized trial for enzalutamide versus bicalutamide and after  90 days of therapy, his PSA was up to 54 and a repeat bone scan on January 03, 2012, showed a progression of disease with increase widespread skeletal metastasis.  Zytiga 1000 mg daily started on 02/01/2012 and stopped in May 2014 due to disease progression.  He is s/p EBXRT to the spine and shoulders completed on 09/26/2012.    Current therapy: Taxotere started on 10/02/2012. He is here for cycle 6 today.  Interim History:  Willie Gaines presents today for a follow up visit with his wife. He received his first 5 cycles of Taxotere without major complications. He is having more fatigue and generalized weakness  but these have stabilized. He reported his pain dramatically improved. He is no longer on fentanyl patch but uses oxycodone when necessary. No neurological symptoms. He had nausea and vomiting following his chemotherapy. He was not taking his antiemetics routinely. No leg edema. Faint grade one neuropathy noted. He is eating well and his weight is stable. No new complications noted. He also have noted new onset of left-sided jaw pain. Some associated with any fevers or any dental complications.  Medications: I have reviewed the patient's current medications. Current Outpatient Prescriptions  Medication Sig Dispense Refill  . acetaminophen (TYLENOL) 500 MG tablet Take 1,000 mg by mouth every 6 (six) hours as needed for pain. Pt uses OTC for pain      . atorvastatin (LIPITOR) 10 MG tablet Take 10 mg by mouth at bedtime. Patient currently taking as of 10/06/12.      . Calcium Citrate-Vitamin D (CITRACAL + D PO) Take 2 tablets by mouth every morning. Patient currently taking as of 10/06/12.      [name] denosumab (XGEVA) 120 MG/1.7ML SOLN Inject 120 mg into the skin every 30 (thirty) days.      [name] Leuprolide Acetate (LUPRON IJ) Inject as directed every 6 (six) months.       . lidocaine-prilocaine (EMLA) cream Apply 1 application topically daily as needed (Applies to port-a-cath.).      [name] LORazepam (ATIVAN) 0.5 MG tablet Take 1 tablet (0.5 mg total) by mouth every 8 (eight) hours as needed (Nausea/vomiting/anxiety).  30 tablet  0  . nystatin cream (MYCOSTATIN) Apply topically as needed for dry skin. Apply to left axilla twice a day as needed.  30 g  1  . ondansetron (ZOFRAN) 8 MG tablet Take 1 tablet (8 mg total) by mouth every 12 (twelve) hours as needed for nausea.  20 tablet  2  . zolpidem (AMBIEN) 5 MG tablet Take 1 tablet (5 mg total) by mouth at bedtime as needed for sleep.  30 tablet  1  . oxyCODONE (OXY IR/ROXICODONE) 5 MG immediate release tablet Take 1-2 tablets (5-10 mg total) by mouth every 4 (four) hours  as needed for pain. PAIN  60 tablet  0   No current facility-administered medications for this visit.  .  Allergies:  Allergies  Allergen Reactions  . Other     SOFT SHELL CRAB-HIVES ALL OTHER    Past Medical History, Surgical history, Social history, and Family History were reviewed and updated.  Review of Systems:  Remaining ROS negative.  Physical Exam: Blood pressure 139/79, pulse 99, temperature 97.2 F (36.2 C), temperature source Oral, resp. rate 20, height 5\' 9"  (1.753 m), weight 169 lb 3.2 oz (76.749 kg). ECOG: 1 General appearance: alert Head: Normocephalic, without obvious abnormality, atraumatic Neck: no adenopathy, no carotid bruit, no JVD, supple, symmetrical, trachea midline and thyroid not enlarged, symmetric, no tenderness/mass/nodules Lymph nodes: Cervical, supraclavicular, and axillary nodes normal. Heart:regular rate and rhythm, S1, S2 normal, no murmur, click, rub or gallop Lung:chest clear, no wheezing, rales, normal symmetric air entry Abdomen: soft, non-tender, without masses or organomegaly EXT:no erythema, induration, or nodules Skin: right should discoloration noted.  Neurological: No motor or sensory deficits. His gait and his mood all normal.  Lab Results: Lab Results  Component Value Date   WBC 5.9 01/21/2013   HGB 11.4* 01/21/2013   HCT 35.7* 01/21/2013   MCV 75.0* 01/21/2013   PLT 217 01/21/2013     Chemistry      Component Value Date/Time   NA 140 12/31/2012 0908   NA 137 10/13/2012 1324   K 3.8 12/31/2012 0908   K 4.3 10/13/2012 1324   CL 107 10/13/2012 1324   CL 107 10/02/2012 1255   CO2 20* 12/31/2012 0908   CO2 20 10/13/2012 1324   BUN 14.4 12/31/2012 0908   BUN 8 10/13/2012 1324   CREATININE 0.8 12/31/2012 0908   CREATININE 0.87 10/13/2012 1324      Component Value Date/Time   CALCIUM 8.1* 12/31/2012 0908   CALCIUM 7.2* 10/13/2012 1324   ALKPHOS 152* 12/31/2012 0908   ALKPHOS 215* 02/09/2012 0935   AST 16 12/31/2012 0908   AST 18 02/09/2012 0935    ALT 7 12/31/2012 0908   ALT 9 02/09/2012 0935   BILITOT 0.36 12/31/2012 0908   BILITOT 0.3 02/09/2012 0935      Results for Willie Gaines (MRN [id number]) as of 01/21/2013 12:04  Ref. Range 12/10/2012 08:48 12/31/2012 09:09  PSA Latest Range: <=4.00 ng/mL 162.30 (H) 158.50 (H)     Impression and Plan: This is a 71 year old gentleman with the following issues:   1. Castration-resistant prostate cancer. He is on Taxotere.  His PSA had been relatively stable without any major decline or elevation. I believe Taxotere have helped palliate his symptoms including diffuse bone pain and have contributed to his overall good quality of life and for the time being I will continue this treatment unless he progresses. She develops rapid progression in his PSA or a poor tolerance to Taxotere then we will switch to a different agent.  2. Back pain and L3 involvement of his cancer. Status post radiation therapy. Pain is  much better and using oxycodone only as needed. I gave him a prescription today.   3. Hormonal deprivation. He continues to be on Lupron at [facility].   4. Bone-directed therapy. I advised him to hold Xgeva for this month due to jaw pain.   5. Neutropenia prophylaxis: Neulasta will be added to each cycle.   6. IV access: Port-A-Cath in place.  7. Nausea vomiting prophylaxis. He has Zofran and Compazine at home.  8. Follow up: 3 weeks for cycle 6 of chemotherapy.    [facility] 9/24/201412:27 PM

## 2013-01-21 NOTE — Telephone Encounter (Signed)
Gave pt appt for lab,Md injections, eamiled Michelle regarding chemo fopr October and November 2015

## 2013-01-22 ENCOUNTER — Ambulatory Visit (HOSPITAL_BASED_OUTPATIENT_CLINIC_OR_DEPARTMENT_OTHER): Payer: Medicare Other

## 2013-01-22 VITALS — BP 147/82 | HR 107 | Temp 97.5°F | Resp 20

## 2013-01-22 DIAGNOSIS — Z5189 Encounter for other specified aftercare: Secondary | ICD-10-CM

## 2013-01-22 DIAGNOSIS — C61 Malignant neoplasm of prostate: Secondary | ICD-10-CM

## 2013-01-22 MED ORDER — PEGFILGRASTIM INJECTION 6 MG/0.6ML
6.0000 mg | Freq: Once | SUBCUTANEOUS | Status: AC
  Administered 2013-01-22: 6 mg via SUBCUTANEOUS
  Filled 2013-01-22: qty 0.6

## 2013-01-30 ENCOUNTER — Other Ambulatory Visit: Payer: Self-pay | Admitting: Oncology

## 2013-02-04 ENCOUNTER — Other Ambulatory Visit: Payer: Self-pay | Admitting: Urology

## 2013-02-05 ENCOUNTER — Other Ambulatory Visit: Payer: Self-pay | Admitting: *Deleted

## 2013-02-05 MED ORDER — PROCHLORPERAZINE 25 MG RE SUPP: 25.0000 mg | Freq: Four times a day (QID) | RECTAL | Status: AC | PRN

## 2013-02-05 NOTE — Telephone Encounter (Signed)
Wife linda calling. Patient vomited all night and is aching all over, vomited up zofran, denies fever. Per dr Clelia Croft, phenergan 25 mg suppositories called to [facility], wife notified. Push clear liquids, 30-40 minutes after giving supp, if tolerated, increase to full liquids, sport drinks preferably, for potassium and sodium replacement. Call back if no improvement

## 2013-02-07 ENCOUNTER — Emergency Department (HOSPITAL_COMMUNITY): Payer: Medicare Other

## 2013-02-07 ENCOUNTER — Emergency Department (HOSPITAL_COMMUNITY)
Admission: EM | Admit: 2013-02-07 | Discharge: 2013-02-08 | Disposition: A | Discharge status: Home / Self Care | Payer: Medicare Other | Attending: Emergency Medicine | Admitting: Emergency Medicine

## 2013-02-07 DIAGNOSIS — Z87891 Personal history of nicotine dependence: Secondary | ICD-10-CM | POA: Insufficient documentation

## 2013-02-07 DIAGNOSIS — R1110 Vomiting, unspecified: Secondary | ICD-10-CM

## 2013-02-07 DIAGNOSIS — Z79899 Other long term (current) drug therapy: Secondary | ICD-10-CM | POA: Insufficient documentation

## 2013-02-07 DIAGNOSIS — Z9221 Personal history of antineoplastic chemotherapy: Secondary | ICD-10-CM | POA: Insufficient documentation

## 2013-02-07 DIAGNOSIS — Z8546 Personal history of malignant neoplasm of prostate: Secondary | ICD-10-CM | POA: Insufficient documentation

## 2013-02-07 DIAGNOSIS — Z923 Personal history of irradiation: Secondary | ICD-10-CM | POA: Insufficient documentation

## 2013-02-07 DIAGNOSIS — R011 Cardiac murmur, unspecified: Secondary | ICD-10-CM | POA: Insufficient documentation

## 2013-02-07 DIAGNOSIS — R112 Nausea with vomiting, unspecified: Secondary | ICD-10-CM | POA: Insufficient documentation

## 2013-02-07 DIAGNOSIS — E860 Dehydration: Secondary | ICD-10-CM | POA: Insufficient documentation

## 2013-02-07 LAB — POCT I-STAT TROPONIN I

## 2013-02-07 LAB — COMPREHENSIVE METABOLIC PANEL
ALT: 6 U/L (ref 0–53)
AST: 20 U/L (ref 0–37)
BUN: 17 mg/dL (ref 6–23)
CO2: 21 mEq/L (ref 19–32)
Calcium: 8.3 mg/dL — ABNORMAL LOW (ref 8.4–10.5)
Chloride: 101 mEq/L (ref 96–112)
Creatinine, Ser: 1.03 mg/dL (ref 0.50–1.35)
GFR calc Af Amer: 81 mL/min — ABNORMAL LOW (ref >90)
GFR calc non Af Amer: 69 mL/min — ABNORMAL LOW (ref >90)
Glucose, Bld: 139 mg/dL — ABNORMAL HIGH (ref 70–99)
Total Bilirubin: 0.3 mg/dL (ref 0.3–1.2)

## 2013-02-07 LAB — CBC WITH DIFFERENTIAL/PLATELET
Eosinophils Relative: 0 % (ref 0–5)
HCT: 37.1 % — ABNORMAL LOW (ref 39.0–52.0)
Hemoglobin: 12.1 g/dL — ABNORMAL LOW (ref 13.0–17.0)
Lymphocytes Relative: 12 % (ref 12–46)
Lymphs Abs: 1 10*3/uL (ref 0.7–4.0)
MCV: 74.2 fL — ABNORMAL LOW (ref 78.0–100.0)
Monocytes Absolute: 0.8 10*3/uL (ref 0.1–1.0)
Monocytes Relative: 10 % (ref 3–12)
Neutro Abs: 6.4 10*3/uL (ref 1.7–7.7)
RBC: 5 MIL/uL (ref 4.22–5.81)
WBC: 8.3 10*3/uL (ref 4.0–10.5)

## 2013-02-07 MED ORDER — SODIUM CHLORIDE 0.9 % IV BOLUS (SEPSIS)
1000.0000 mL | Freq: Once | INTRAVENOUS | Status: AC
  Administered 2013-02-07: 1000 mL via INTRAVENOUS

## 2013-02-07 MED ORDER — ONDANSETRON HCL 4 MG/2ML IJ SOLN
4.0000 mg | Freq: Once | INTRAMUSCULAR | Status: AC
  Administered 2013-02-07: 4 mg via INTRAVENOUS
  Filled 2013-02-07: qty 2

## 2013-02-07 MED ORDER — GI COCKTAIL ~~LOC~~
30.0000 mL | Freq: Once | ORAL | Status: AC
  Administered 2013-02-07: 30 mL via ORAL
  Filled 2013-02-07: qty 30

## 2013-02-07 NOTE — ED Notes (Addendum)
Patient is alert and oriented x3.  He is complaining of nausea and vomiting that started Wednesday.   Patient has an issue with nausea and vomiting but only after his injections.  He states that his last injection Was 2 weeks ago.  This episode was a sudden onset with no diarrhea.  He denies lightheadedness or dizziness, But does state that he is very weak with some heartburn and has lost 10 lbs in the last 2 days

## 2013-02-07 NOTE — ED Provider Notes (Addendum)
CSN: [id number]     Arrival date & time 02/07/13  2101 History   First MD Initiated Contact with Patient 02/07/13 2106     Chief Complaint  Patient presents with  . Nausea  . Emesis   (Consider location/radiation/quality/duration/timing/severity/associated sxs/prior Treatment) The history is provided by the patient.  Willie Gaines is a 72 y.o. male history of prostate cancer on chemotherapy and radiation (last treatment was 2 weeks ago) here presenting with nausea and vomiting. Nausea vomiting for last 3 days. Was unable to keep anything down. Tried Zofran and Phenergan suppositories without any relief. Denies any abdominal pain or chest pain. No history of SBO. Denies fever at home.   Past Medical History  Diagnosis Date  . Blood transfusion     ?   [name] Cancer     hx of prostate cancer -S/P ROBOTIC PROSTATECTOMY --THEN RADIATION TX FOR 4 MONTHS--;LAST TX JUNE 2010 ?     NOW HAS METASTATIC DISEASE AND LEFT URETERAL OBSTRUCTION  AND  HAS LEFT URETERAL  STENT  . Heart murmur    Past Surgical History  Procedure Laterality Date  . Other surgical history      cystoscopy x multiple times with stent change   . Other surgical history      prostatectomy   . Cystoscopy w/ ureteral stent placement  06/01/2011    Procedure: CYSTOSCOPY WITH STENT REPLACEMENT;  Surgeon: Crecencio Mc, MD;  Location: [facility];  Service: Urology;  Laterality: N/A;  left stent   . Robot assisted laparoscopic radical prostatectomy  2009  . Rotator cuff repair      BILATERAL  . Appendectomy  1955  . Eye surgery  2012    7/12 and 8/12  CATARACT EXTRACTIONS  . Colon surgery      04/11/09 LAPAROSCOPIC ASSISTED RIGHT HEMI-COLECTOMY FOR TUBULOVILLOUS ADENOMA AND POLYP.  04/25/09 EXPLORATORY LAP, LYSIS  ADHESIONS, EVACUATION INFECTED HEMATOMA AND ABSCESS AND DIVERTING LOOP ILEOSTOMY.   08/05/09  LYSIS OF ADHESIONS AND ILEOSTOMY TAKEDOWN.  [name] Cystoscopy w/ ureteral stent placement  10/04/2011    Procedure: CYSTOSCOPY WITH STENT  REPLACEMENT;  Surgeon: Crecencio Mc, MD;  Location: [facility];  Service: Urology;  Laterality: Left;  . Cystoscopy w/ ureteral stent placement  01/28/2012    Procedure: CYSTOSCOPY WITH STENT REPLACEMENT;  Surgeon: Crecencio Mc, MD;  Location: [facility];  Service: Urology;  Laterality: Left;  LEFT URETERAL STENT CHANGE  C-ARM    . Cystoscopy w/ ureteral stent placement Left 06/20/2012    Procedure: CYSTOSCOPY WITH STENT REPLACEMENT;  Surgeon: Crecencio Mc, MD;  Location: [facility];  Service: Urology;  Laterality: Left;  . Cystoscopy w/ ureteral stent placement Left 10/13/2012    Procedure: CYSTOSCOPY WITH STENT REPLACEMENT;  Surgeon: Crecencio Mc, MD;  Location: [facility];  Service: Urology;  Laterality: Left;   No family history on file. History  Substance Use Topics  . Smoking status: Former Smoker -- 1.00 packs/day for 25 years    Types: Cigarettes    Quit date: 04/30/1978  . Smokeless tobacco: Never Used  . Alcohol Use: No    Review of Systems  Gastrointestinal: Positive for nausea and vomiting.  All other systems reviewed and are negative.    Allergies  Other  Home Medications   Current Outpatient Rx  Name  Route  Sig  Dispense  Refill  . acetaminophen (TYLENOL) 500 MG tablet   Oral   Take 1,000 mg by mouth every 6 (six) hours as needed for pain. Pt uses  OTC for pain         . Calcium Citrate-Vitamin D (CITRACAL + D PO)   Oral   Take 2 tablets by mouth daily. Patient currently taking as of 10/06/12.         [name] denosumab (XGEVA) 120 MG/1.7ML SOLN   Subcutaneous   Inject 120 mg into the skin every 30 (thirty) days.         [name] Leuprolide Acetate (LUPRON IJ)   Injection   Inject as directed every 6 (six) months.          . lidocaine-prilocaine (EMLA) cream   Topical   Apply 1 application topically daily as needed (Applies to port-a-cath.).         [name] LORazepam (ATIVAN) 0.5 MG tablet   Oral   Take 1 tablet (0.5 mg total) by mouth every 8 (eight) hours as needed  (Nausea/vomiting/anxiety).   30 tablet   0   . nystatin cream (MYCOSTATIN)   Topical   Apply topically as needed for dry skin. Apply to left axilla twice a day as needed.   30 g   1   . ondansetron (ZOFRAN) 8 MG tablet   Oral   Take 1 tablet (8 mg total) by mouth every 12 (twelve) hours as needed for nausea.   20 tablet   2   . oxyCODONE (OXY IR/ROXICODONE) 5 MG immediate release tablet   Oral   Take 1-2 tablets (5-10 mg total) by mouth every 4 (four) hours as needed for pain. PAIN   60 tablet   0   . prochlorperazine (COMPAZINE) 25 MG suppository   Rectal   Place 1 suppository (25 mg total) rectally every 6 (six) hours as needed for nausea.   12 suppository   1   . zolpidem (AMBIEN) 5 MG tablet   Oral   Take 1 tablet (5 mg total) by mouth at bedtime as needed for sleep.   30 tablet   1    BP 160/92  Pulse 109  Temp(Src) 99.4 F (37.4 C) (Oral)  Resp 20  Wt 160 lb (72.576 kg)  BMI 23.62 kg/m2  SpO2 98% Physical Exam  Nursing note and vitals reviewed. Constitutional: He is oriented to person, place, and time.  Thin, chronically ill, dehydrated   HENT:  Head: Normocephalic.  MM slightly dry   Eyes: Conjunctivae are normal. Pupils are equal, round, and reactive to light.  Neck: Normal range of motion. Neck supple.  Cardiovascular: Normal rate, regular rhythm and normal heart sounds.   Pulmonary/Chest: Effort normal and breath sounds normal. No respiratory distress. He has no wheezes. He has no rales.  Abdominal: Soft.  Previous surgical scars well healed. No abdominal tenderness. No CVAT   Musculoskeletal: Normal range of motion.  Neurological: He is alert and oriented to person, place, and time.  Skin: Skin is warm and dry.  Psychiatric: He has a normal mood and affect. His behavior is normal. Judgment and thought content normal.    ED Course  Procedures (including critical care time) Labs Review Labs Reviewed  CBC WITH DIFFERENTIAL - Abnormal; Notable  for the following:    Hemoglobin 12.1 (*)    HCT 37.1 (*)    MCV 74.2 (*)    MCH 24.2 (*)    RDW 16.6 (*)    Neutrophils Relative % 78 (*)    All other components within normal limits  COMPREHENSIVE METABOLIC PANEL - Abnormal; Notable for the following:    Sodium 131 (*)  Glucose, Bld 139 (*)    Calcium 8.3 (*)    Alkaline Phosphatase 181 (*)    GFR calc non Af Amer 69 (*)    GFR calc Af Amer 81 (*)    All other components within normal limits  LIPASE, BLOOD  POCT I-STAT TROPONIN I   Imaging Review Dg Abd Acute W/chest  02/07/2013   CLINICAL DATA:  Vomiting for the past 4 days. Ex-smoker. Metastatic prostate cancer.  EXAM: ACUTE ABDOMEN SERIES (ABDOMEN 2 VIEW & CHEST 1 VIEW)  COMPARISON:  Previous examinations.  FINDINGS: Normal sized heart. Right jugular port catheter tip in the superior vena cava. A small nodular density overlying the left lung base, laterally, is shown to represent a bone island or focal sclerotic metastasis within the anterior aspect of a rib on the CT dated 09/02/2012. Extensive sclerotic metastases elsewhere in the included bony skeleton. Normal bowel gas pattern without free peritoneal air. Left ureteral stent. Right mid abdominal surgical staples.  IMPRESSION: 1. No acute abnormality. 2. Extensive sclerotic bony metastatic disease.   Electronically Signed   By: Gordan Payment [name]   On: 02/07/2013 23:31    EKG Interpretation     Ventricular Rate:  100 PR Interval:  144 QRS Duration: 70 QT Interval:  358 QTC Calculation: 461 R Axis:   62 Text Interpretation:  Normal sinus rhythm Normal ECG No significant change since last tracing            MDM   1. Vomiting   2. Dehydration    Willie Gaines is a 72 y.o. male here with nausea/vomiting and dehydration. Will get labs and hydrate patient. No abdominal pain low suspicion for SBO so will get xray for now.   12:37 AM Labs at baseline. Xray showed no SBO. Tolerated PO here. Has zofran and phenergan  at home. Stable for d/c.     Richardean Canal, MD 02/07/13 2330  Richardean Canal, MD 02/08/13 [phone]

## 2013-02-08 MED ORDER — HEPARIN SOD (PORK) LOCK FLUSH 100 UNIT/ML IV SOLN
500.0000 [IU] | Freq: Once | INTRAVENOUS | Status: AC
  Administered 2013-02-08: 500 [IU]
  Filled 2013-02-08: qty 5

## 2013-02-08 NOTE — ED Notes (Signed)
Patient is alert and oriented x3.  He was given DC instructions and follow up visit instructions.  Patient gave verbal understanding.  He was DC ambulatory under his own power to home.  V/S stable.  He was not showing any signs of distress on DC 

## 2013-02-10 ENCOUNTER — Ambulatory Visit (HOSPITAL_BASED_OUTPATIENT_CLINIC_OR_DEPARTMENT_OTHER): Payer: Medicare Other

## 2013-02-10 ENCOUNTER — Other Ambulatory Visit (HOSPITAL_BASED_OUTPATIENT_CLINIC_OR_DEPARTMENT_OTHER): Payer: Medicare Other | Admitting: Lab

## 2013-02-10 ENCOUNTER — Ambulatory Visit (HOSPITAL_BASED_OUTPATIENT_CLINIC_OR_DEPARTMENT_OTHER): Payer: Medicare Other | Admitting: Oncology

## 2013-02-10 ENCOUNTER — Encounter: Payer: Self-pay | Admitting: Oncology

## 2013-02-10 VITALS — BP 127/76 | HR 117 | Temp 98.9°F | Resp 20 | Ht 69.0 in | Wt 161.0 lb

## 2013-02-10 DIAGNOSIS — E860 Dehydration: Secondary | ICD-10-CM

## 2013-02-10 DIAGNOSIS — C61 Malignant neoplasm of prostate: Secondary | ICD-10-CM

## 2013-02-10 DIAGNOSIS — E46 Unspecified protein-calorie malnutrition: Secondary | ICD-10-CM | POA: Insufficient documentation

## 2013-02-10 DIAGNOSIS — C7951 Secondary malignant neoplasm of bone: Secondary | ICD-10-CM

## 2013-02-10 DIAGNOSIS — R112 Nausea with vomiting, unspecified: Secondary | ICD-10-CM

## 2013-02-10 DIAGNOSIS — F411 Generalized anxiety disorder: Secondary | ICD-10-CM

## 2013-02-10 LAB — COMPREHENSIVE METABOLIC PANEL (CC13)
ALT: <6 U/L (ref 0–55)
Alkaline Phosphatase: 177 U/L — ABNORMAL HIGH (ref 40–150)
Anion Gap: 10 mEq/L (ref 3–11)
CO2: 19 mEq/L — ABNORMAL LOW (ref 22–29)
Calcium: 8.5 mg/dL (ref 8.4–10.4)
Chloride: 104 mEq/L (ref 98–109)
Creatinine: 1 mg/dL (ref 0.7–1.3)
Total Protein: 7.1 g/dL (ref 6.4–8.3)

## 2013-02-10 LAB — CBC WITH DIFFERENTIAL/PLATELET
BASO%: 0.7 % (ref 0.0–2.0)
Basophils Absolute: 0 10*3/uL (ref 0.0–0.1)
Eosinophils Absolute: 0 10*3/uL (ref 0.0–0.5)
HCT: 36.6 % — ABNORMAL LOW (ref 38.4–49.9)
HGB: 11.7 g/dL — ABNORMAL LOW (ref 13.0–17.1)
LYMPH%: 13.8 % — ABNORMAL LOW (ref 14.0–49.0)
MCHC: 32.1 g/dL (ref 32.0–36.0)
MCV: 74.2 fL — ABNORMAL LOW (ref 79.3–98.0)
MONO#: 1 10*3/uL — ABNORMAL HIGH (ref 0.1–0.9)
MONO%: 13.8 % (ref 0.0–14.0)
NEUT#: 5 10*3/uL (ref 1.5–6.5)
NEUT%: 71.5 % (ref 39.0–75.0)
WBC: 7 10*3/uL (ref 4.0–10.3)
lymph#: 1 10*3/uL (ref 0.9–3.3)

## 2013-02-10 LAB — PSA: PSA: 300.7 ng/mL — ABNORMAL HIGH (ref <=4.00)

## 2013-02-10 MED ORDER — LORAZEPAM 2 MG/ML IJ SOLN
0.5000 mg | Freq: Once | INTRAMUSCULAR | Status: AC
  Administered 2013-02-10: 0.5 mg via INTRAVENOUS

## 2013-02-10 MED ORDER — LORAZEPAM 2 MG/ML IJ SOLN
INTRAMUSCULAR | Status: AC
  Filled 2013-02-10: qty 1

## 2013-02-10 MED ORDER — DEXAMETHASONE SODIUM PHOSPHATE 10 MG/ML IJ SOLN
10.0000 mg | Freq: Once | INTRAMUSCULAR | Status: AC
  Administered 2013-02-10: 10 mg via INTRAVENOUS

## 2013-02-10 MED ORDER — DEXAMETHASONE 4 MG PO TABS: 4.0000 mg | ORAL_TABLET | Freq: Every day | ORAL | Status: AC

## 2013-02-10 MED ORDER — ONDANSETRON 8 MG/50ML IVPB (CHCC)
8.0000 mg | Freq: Once | INTRAVENOUS | Status: AC
  Administered 2013-02-10: 8 mg via INTRAVENOUS

## 2013-02-10 MED ORDER — SODIUM CHLORIDE 0.9 % IV SOLN
Freq: Once | INTRAVENOUS | Status: AC
  Administered 2013-02-10: 10:00:00 via INTRAVENOUS

## 2013-02-10 MED ORDER — DEXAMETHASONE SODIUM PHOSPHATE 10 MG/ML IJ SOLN
INTRAMUSCULAR | Status: AC
  Filled 2013-02-10: qty 1

## 2013-02-10 MED ORDER — ONDANSETRON 8 MG/NS 50 ML IVPB
INTRAVENOUS | Status: AC
  Filled 2013-02-10: qty 8

## 2013-02-10 NOTE — Progress Notes (Addendum)
Hematology and Oncology Follow Up Visit  Willie Gaines [id number] 1940-11-30 72 y.o. 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 Diagnosed 2009. Gleason score 4+3 = 7 PSA 9.5  Prior Therapy: He underwent a radical prostatectomy on January 01, 2008, and the pathology from that, case number [id number], by Dr. Laverle Patter showed a prostatic adenocarcinoma, Gleason score of 4+3=7, involves both lobes. Tumor extends into the extracapsular tissue, involvement of the seminal vesicle. Lymph nodes were negative, and his pathological staging of T3b N0.  The patient did very well, did received adjuvant radiation therapy due to positive margins, and PSA was undetectable post surgery.  The patient developed biochemical relapse in October of 2010 July of 2011 and presented with metastatic disease and ureteral obstruction in August of 2011. The patient started with androgen deprivation at that time as well had a stent placement The patient did well initially with hormonal deprivation up until March 2013, where he developed castration-resistant disease, again with a bone scan showed  metastatic bony involvement. The patient was enrolled in the STRIVE trial, a randomized trial for enzalutamide versus bicalutamide and after  90 days of therapy, his PSA was up to 54 and a repeat bone scan on January 03, 2012, showed a progression of disease with increase widespread skeletal metastasis.  Zytiga 1000 mg daily started on 02/01/2012 and stopped in May 2014 due to disease progression.  He is s/p EBXRT to the spine and shoulders completed on 09/26/2012.    Current therapy: Taxotere started on 10/02/2012. He is here for cycle 6 today.  Interim History:  Willie Gaines presents today for a follow up visit with his wife. The patient reports that he has not been doing well since his last chemotherapy. He was seen in the emergency room this  past weekend due to nausea, vomiting, and dehydration. He was given IV fluids and IV anti-emetics. An abdominal x-ray was performed which showed no bowel obstruction. He still reports having nausea, but has only vomited once since this past weekend. He has Zofran, Compazine, and Ativan at home, but states that he is unsure how to take these. He is having more fatigue and generalized weakness. He is in a wheelchair today. States that he is not sleeping well due to nausea. He has been using his Ativan. He reports feeling anxious, but has not been using his Ativan. His pain is controlled and he is taking oxycodone only on an as needed basis. No neurological symptoms. No leg edema. Faint grade one neuropathy noted. His appetite is poor and he has lost 8 pounds since his last visit with [place]. His left-sided jaw pain has resolved.  Medications: I have reviewed the patient's current medications. Current Outpatient Prescriptions  Medication Sig Dispense Refill  . acetaminophen (TYLENOL) 500 MG tablet Take 1,000 mg by mouth every 6 (six) hours as needed for pain. Pt uses OTC for pain      . Calcium Citrate-Vitamin D (CITRACAL + D PO) Take 2 tablets by mouth daily. Patient currently taking as of 10/06/12.      [name] denosumab (XGEVA) 120 MG/1.7ML SOLN Inject 120 mg into the skin every 30 (thirty) days.      [name] dexamethasone (DECADRON) 4 MG tablet Take 1 tablet (4 mg total) by mouth daily.  30 tablet  1  . Leuprolide Acetate (LUPRON IJ) Inject as directed every 6 (six) months.       [name]  lidocaine-prilocaine (EMLA) cream Apply 1 application topically daily as needed (Applies to port-a-cath.).      [name] LORazepam (ATIVAN) 0.5 MG tablet Take 1 tablet (0.5 mg total) by mouth every 8 (eight) hours as needed (Nausea/vomiting/anxiety).  30 tablet  0  . nystatin cream (MYCOSTATIN) Apply topically as needed for dry skin. Apply to left axilla twice a day as needed.  30 g  1  . ondansetron (ZOFRAN) 8 MG tablet Take 1 tablet (8 mg total)  by mouth every 12 (twelve) hours as needed for nausea.  20 tablet  2  . oxyCODONE (OXY IR/ROXICODONE) 5 MG immediate release tablet Take 1-2 tablets (5-10 mg total) by mouth every 4 (four) hours as needed for pain. PAIN  60 tablet  0  . prochlorperazine (COMPAZINE) 25 MG suppository Place 1 suppository (25 mg total) rectally every 6 (six) hours as needed for nausea.  12 suppository  1  . zolpidem (AMBIEN) 5 MG tablet Take 1 tablet (5 mg total) by mouth at bedtime as needed for sleep.  30 tablet  1   No current facility-administered medications for this visit.  .  Allergies:  Allergies  Allergen Reactions  . Other     SOFT SHELL CRAB-HIVES ALL OTHER    Past Medical History, Surgical history, Social history, and Family History were reviewed and updated.  Review of Systems:  Remaining ROS negative.  Physical Exam: Blood pressure 127/76, pulse 117, temperature 98.9 F (37.2 C), temperature source Oral, resp. rate 20, height 5\' 9"  (1.753 m), weight 161 lb (73.029 kg). ECOG: 1 General appearance: alert. Patient is in a wheelchair today and appears fatigued. Head: Normocephalic, without obvious abnormality, atraumatic Neck: no adenopathy, no carotid bruit, no JVD, supple, symmetrical, trachea midline and thyroid not enlarged, symmetric, no tenderness/mass/nodules Lymph nodes: Cervical, supraclavicular, and axillary nodes normal. Heart:regular rate and rhythm, S1, S2 normal, systolic murmur: holosystolic 3/6, blowing at lower left sternal border and no rub Lung:chest clear, no wheezing, rales, normal symmetric air entry Abdomen: soft, non-tender, without masses or organomegaly EXT:no erythema, induration, or nodules Skin: right should discoloration noted.  Neurological: No motor or sensory deficits. His gait and his mood all normal.  Lab Results: Lab Results  Component Value Date   WBC 7.0 02/10/2013   HGB 11.7* 02/10/2013   HCT 36.6* 02/10/2013   MCV 74.2* 02/10/2013   PLT 242  02/10/2013     Chemistry      Component Value Date/Time   NA 133* 02/10/2013 0858   NA 131* 02/07/2013 2200   K 4.2 02/10/2013 0858   K 4.6 02/07/2013 2200   CL 101 02/07/2013 2200   CL 107 10/02/2012 1255   CO2 19* 02/10/2013 0858   CO2 21 02/07/2013 2200   BUN 14.8 02/10/2013 0858   BUN 17 02/07/2013 2200   CREATININE 1.0 02/10/2013 0858   CREATININE 1.03 02/07/2013 2200      Component Value Date/Time   CALCIUM 8.5 02/10/2013 0858   CALCIUM 8.3* 02/07/2013 2200   ALKPHOS 177* 02/10/2013 0858   ALKPHOS 181* 02/07/2013 2200   AST 19 02/10/2013 0858   AST 20 02/07/2013 2200   ALT <6 02/10/2013 0858   ALT 6 02/07/2013 2200   BILITOT 0.47 02/10/2013 0858   BILITOT 0.3 02/07/2013 2200     Results for Willie Gaines, Willie Gaines (MRN [id number]) as of 02/10/2013 09:19  Ref. Range 10/23/2012 13:20 11/19/2012 11:36 12/10/2012 08:48 12/31/2012 09:09 01/21/2013 11:49  PSA Latest Range: <=4.00 ng/mL 169.70 (H) 129.20 (  H) 162.30 (H) 158.50 (H) 198.20 (H)    Impression and Plan: This is a 72 year old gentleman with the following issues:   1. Castration-resistant prostate cancer. He is on Taxotere.  His PSA had been relatively stable without any major decline or elevation. I believe Taxotere have helped palliate his symptoms including diffuse bone pain. We will hold his chemotherapy this week due to nausea, vomiting, and dehydration. Will reassess him in 3 weeks to decide if he should proceed with additional chemotherapy at that time.  2. Back pain and L3 involvement of his cancer. Status post radiation therapy. Pain is much better and using oxycodone only as needed.    3. Hormonal deprivation. He continues to be on Lupron at [facility].   4. Bone-directed therapy. He is receiving Xgeva monthly at [facility].   5. Neutropenia prophylaxis: Neulasta given with each cycle of chemotherapy.   6. IV access: Port-A-Cath in place.  7. Nausea vomiting. I have reviewed his anti-emetics with him. I  have given him written instructions on how to take his Zofran, Compazine, and Ativan. We will add dexamethasone 4 mg daily to help his nausea. He will receive IV Ativan, dexamethasone, and Zofran today in our office.  8. Protein calorie malnutrition. The patient is not eating due to his nausea and vomiting. See #7. The addition of dexamethasone may also help stimulate his appetite. I have referred him to [facility].  9. Dehydration. The patient received IV fluids today and tomorrow. I have given him instructions regarding a clear liquid diet and he is to advance as tolerated.  10. Anxiety. I have encouraged the use of Ativan. He will receive Ativan for his IV today.  11. Follow up: 3 weeks for cycle 6 of chemotherapy.    Clenton Pare 10/14/201411:05 AM   Patient seen and evaluated today. He is reporting overall weakness and fatigue and decreased by mouth intake. He is not reporting any pain specifically at this time.  His physical examination today did not reveal any lymphadenopathy or mucositis. No oral thrush but he was slightly tachycardic. Abdominal examination did not reveal any rebound or guarding.  Laboratory data was reviewed and discussed today with Willie Gaines and his wife.  The plan would be to continue supportive care and we will hold chemotherapy today given his failure to thrive at this point. We will set him up will IV fluids and anti-emetics as well as dexamethasone for supportive care purposes. He also has clear instructions he develops any fever, shortness of breath or syncope to proceed to the emergency department in the meantime.  [facility] MD 02/10/2013

## 2013-02-10 NOTE — Patient Instructions (Addendum)
Take Anti Nausea Meds as Directed: 1. Zofran (ondansetron) 8 mg every 12 hrs as needed. This is more preventative for chemotherapy induced nausea and also non sedating.  2. For unrelieved nausea; Add Compazine (prochloraperzine) 10 mg every 6 hrs as needed. Take every 6 hrs if any nausea. This is sedating.  3. For continued unrelieved nausea; Add the Ativan (lorazepam) 0.5 mg every 6 hrs as needed (can alternate with the compazine). Can melt under the tongue and this is also sedating.   For Nausea and or Vomiting unrelieved by all 3 nausea meds, Call us.   Anxiety:  Use Ativan (Lorazepam) 0.5 mg every 6 hours as needed for nausea  Appetite:  Begin Dexamethasone 4 mg daily. I have sent this prescription to your pharmacy. Dexamethasone will also help your nausea.     Clear Liquid Diet The clear liquid dietconsists of foods that are liquid or will become liquid at room temperature.You should be able to see through the liquid and beverages. Examples of foods allowed on a clear liquid diet include fruit juice, broth or bouillon, gelatin, or frozen ice pops. The purpose of this diet is to provide necessary fluid, electrolytes such as sodium and potassium, and energy to keep the body functioning during times when you are not able to consume a regular diet.A clear liquid diet should not be continued for long periods of time as it is not nutritionally adequate.  REASONS FOR USING A CLEAR LIQUID DIET  In sudden onset (acute) conditions for a patient before or after surgery.  As the first step in oral feeding.  For fluid and electrolyte replacement in diarrheal diseases.  As a diet before certain medical tests are performed. ADEQUACY The clear liquid diet is adequate only in ascorbic acid, according to the Recommended Dietary Allowances of the [employer]. CHOOSING FOODS Breads and Starches  Allowed:  None are allowed.  Avoid: All are avoided. Vegetables  Allowed:   Strained tomato or vegetable juice.  Avoid: Any others. Fruit  Allowed:  Strained fruit juices and fruit drinks. Include 1 serving of citrus or vitamin C-enriched fruit juice daily.  Avoid: Any others. Meat and Meat Substitutes  Allowed:  None are allowed.  Avoid: All are avoided. Milk  Allowed:  None are allowed.  Avoid: All are avoided. Soups and Combination Foods  Allowed:  Clear bouillon, broth, or strained broth-based soups.  Avoid: Any others. Desserts and Sweets  Allowed:  Sugar, honey. High protein gelatin. Flavored gelatin, ices, or frozen ice pops that do not contain milk.  Avoid: Any others. Fats and Oils  Allowed:  None are allowed.  Avoid: All are avoided. Beverages  Allowed: Cereal beverages, coffee (regular or decaffeinated), tea, or soda at the discretion of your caregiver.  Avoid: Any others. Condiments  Allowed:  Iodized salt.  Avoid: Any others, including pepper. Supplements  Allowed:  Liquid nutrition beverages.  Avoid: Any others that contain lactose or fiber. SAMPLE MEAL PLAN Breakfast  4 oz (120 mL) strained orange juice.   to 1 cup (125 to 250 mL) gelatin (plain or fortified).  1 cup (250 mL) beverage (coffee or tea).  Sugar, if desired. Midmorning Snack   cup (125 mL) gelatin (plain or fortified). Lunch  1 cup (250 mL) broth or consomm.  4 oz (120 mL) strained grapefruit juice.   cup (125 mL) gelatin (plain or fortified).  1 cup (250 mL) beverage (coffee or tea).  Sugar, if desired. Midafternoon Snack   cup (125 mL)  fruit ice.   cup (125 mL) strained fruit juice. Dinner  1 cup (250 mL) broth or consomm.   cup (125 mL) cranberry juice.   cup (125 mL) flavored gelatin (plain or fortified).  1 cup (250 mL) beverage (coffee or tea).  Sugar, if desired. Evening Snack  4 oz (120 mL) strained apple juice (vitamin C-fortified).   cup (125 mL) flavored gelatin (plain or fortified). Document  Released: 04/16/2005 Document Revised: 07/09/2011 Document Reviewed: 07/14/2010 [facility] Patient Information 2014 [place], [place].

## 2013-02-10 NOTE — Patient Instructions (Signed)
Dehydration, Adult Dehydration is when you lose more fluids from the body than you take in. Vital organs like the kidneys, brain, and heart cannot function without a proper amount of fluids and salt. Any loss of fluids from the body can cause dehydration.  CAUSES   Vomiting.  Diarrhea.  Excessive sweating.  Excessive urine output.  Fever. SYMPTOMS  Mild dehydration  Thirst.  Dry lips.  Slightly dry mouth. Moderate dehydration  Very dry mouth.  Sunken eyes.  Skin does not bounce back quickly when lightly pinched and released.  Dark urine and decreased urine production.  Decreased tear production.  Headache. Severe dehydration  Very dry mouth.  Extreme thirst.  Rapid, weak pulse (more than 100 beats per minute at rest).  Cold hands and feet.  Not able to sweat in spite of heat and temperature.  Rapid breathing.  Blue lips.  Confusion and lethargy.  Difficulty being awakened.  Minimal urine production.  No tears. DIAGNOSIS  Your caregiver will diagnose dehydration based on your symptoms and your exam. Blood and urine tests will help confirm the diagnosis. The diagnostic evaluation should also identify the cause of dehydration. TREATMENT  Treatment of mild or moderate dehydration can often be done at home by increasing the amount of fluids that you drink. It is best to drink small amounts of fluid more often. Drinking too much at one time can make vomiting worse. Refer to the home care instructions below. Severe dehydration needs to be treated at the hospital where you will probably be given intravenous (IV) fluids that contain water and electrolytes. HOME CARE INSTRUCTIONS   Ask your caregiver about specific rehydration instructions.  Drink enough fluids to keep your urine clear or pale yellow.  Drink small amounts frequently if you have nausea and vomiting.  Eat as you normally do.  Avoid:  Foods or drinks high in sugar.  Carbonated  drinks.  Juice.  Extremely hot or cold fluids.  Drinks with caffeine.  Fatty, greasy foods.  Alcohol.  Tobacco.  Overeating.  Gelatin desserts.  Wash your hands well to avoid spreading bacteria and viruses.  Only take over-the-counter or prescription medicines for pain, discomfort, or fever as directed by your caregiver.  Ask your caregiver if you should continue all prescribed and over-the-counter medicines.  Keep all follow-up appointments with your caregiver. SEEK MEDICAL CARE IF:  You have abdominal pain and it increases or stays in one area (localizes).  You have a rash, stiff neck, or severe headache.  You are irritable, sleepy, or difficult to awaken.  You are weak, dizzy, or extremely thirsty. SEEK IMMEDIATE MEDICAL CARE IF:   You are unable to keep fluids down or you get worse despite treatment.  You have frequent episodes of vomiting or diarrhea.  You have blood or green matter (bile) in your vomit.  You have blood in your stool or your stool looks black and tarry.  You have not urinated in 6 to 8 hours, or you have only urinated a small amount of very dark urine.  You have a fever.  You faint. MAKE SURE YOU:   Understand these instructions.  Will watch your condition.  Will get help right away if you are not doing well or get worse. Document Released: 04/16/2005 Document Revised: 07/09/2011 Document Reviewed: 12/04/2010 Exit[employer] Patient Information 2014 [employer].  

## 2013-02-11 ENCOUNTER — Ambulatory Visit (HOSPITAL_BASED_OUTPATIENT_CLINIC_OR_DEPARTMENT_OTHER): Payer: Medicare Other

## 2013-02-11 VITALS — BP 123/78 | HR 103 | Temp 97.6°F

## 2013-02-11 DIAGNOSIS — R112 Nausea with vomiting, unspecified: Secondary | ICD-10-CM

## 2013-02-11 DIAGNOSIS — E860 Dehydration: Secondary | ICD-10-CM

## 2013-02-11 MED ORDER — SODIUM CHLORIDE 0.9 % IV SOLN
Freq: Once | INTRAVENOUS | Status: AC
  Administered 2013-02-11: 10:00:00 via INTRAVENOUS

## 2013-02-11 MED ORDER — HEPARIN SOD (PORK) LOCK FLUSH 100 UNIT/ML IV SOLN
500.0000 [IU] | Freq: Once | INTRAVENOUS | Status: AC
  Administered 2013-02-11: 500 [IU] via INTRAVENOUS
  Filled 2013-02-11: qty 5

## 2013-02-11 MED ORDER — SODIUM CHLORIDE 0.9 % IJ SOLN
10.0000 mL | INTRAMUSCULAR | Status: DC | PRN
  Administered 2013-02-11: 10 mL via INTRAVENOUS
  Filled 2013-02-11: qty 10

## 2013-02-11 MED ORDER — ONDANSETRON 8 MG/NS 50 ML IVPB
INTRAVENOUS | Status: AC
  Filled 2013-02-11: qty 8

## 2013-02-11 MED ORDER — ONDANSETRON 8 MG/50ML IVPB (CHCC)
8.0000 mg | Freq: Once | INTRAVENOUS | Status: AC
  Administered 2013-02-11: 8 mg via INTRAVENOUS

## 2013-02-11 NOTE — Progress Notes (Signed)
PSA results discussed with the patient today. Given his PSA progression, I will switch him to [facility].  This was discussed with the patient in person today 02/11/2013.

## 2013-02-11 NOTE — Patient Instructions (Signed)
Dehydration, Adult  Dehydration means your body does not have as much fluid as it needs. Your kidneys, brain, and heart will not work properly without the right amount of fluids and salt.   HOME CARE   Ask your doctor how to replace body fluid losses (rehydrate).   Drink enough fluids to keep your pee (urine) clear or pale yellow.   Drink small amounts of fluids often if you feel sick to your stomach (nauseous) or throw up (vomit).   Eat like you normally do.   Avoid:   Foods or drinks high in sugar.   Bubbly (carbonated) drinks.   Juice.   Very hot or cold fluids.   Drinks with caffeine.   Fatty, greasy foods.   Alcohol.   Tobacco.   Eating too much.   Gelatin desserts.   Wash your hands to avoid spreading germs (bacteria, viruses).   Only take medicine as told by your doctor.   Keep all doctor visits as told.  GET HELP RIGHT AWAY IF:    You cannot drink something without throwing up.   You get worse even with treatment.   Your vomit has blood in it or looks greenish.   Your poop (stool) has blood in it or looks black and tarry.   You have not peed in 6 to 8 hours.   You pee a small amount of very dark pee.   You have a fever.   You pass out (faint).   You have belly (abdominal) pain that gets worse or stays in one spot (localizes).   You have a rash, stiff neck, or bad headache.   You get easily annoyed, sleepy, or are hard to wake up.   You feel weak, dizzy, or very thirsty.  MAKE SURE YOU:    Understand these instructions.   Will watch your condition.   Will get help right away if you are not doing well or get worse.  Document Released: 02/10/2009 Document Revised: 07/09/2011 Document Reviewed: 12/04/2010  [employer] Patient Information 2014 [employer].

## 2013-02-11 NOTE — Progress Notes (Signed)
Pt requesting zofran IV for nausea, per Dr Clelia Croft, okay to give 8mg  zofran IV.  SLJ

## 2013-02-12 ENCOUNTER — Ambulatory Visit: Payer: Self-pay

## 2013-02-12 ENCOUNTER — Encounter (HOSPITAL_COMMUNITY): Payer: Self-pay | Admitting: *Deleted

## 2013-02-12 NOTE — Progress Notes (Signed)
SPOKE WITH PT BY PHONE.  HE IS SCHEDULED FOR SURGERY WITH DR. Laverle Patter 10/27.  HIS LAST CHEMO WAS GIVEN 01/21/13 - HE WAS UNABLE TO TAKE PLANNED CHEMO ON 10/14 DUE TO N&V AND DEHYDRATION.  HE HAS LABS IN EPIC CBC, DIFF, CMET THAT WERE DONE 02/10/13 - SHOULD NOT NEED ANY ADDITIONAL LABS DAY OF SURGERY.  HE HAS CURRENT EKG AND CXR IN EPIC FROM 02/07/13.  PREOP INSTRUCTIONS AND CHLORHEXIDINE INSTRUCTIONS / PRECAUTIONS REVIEWED WITH PT BY PHONE.

## 2013-02-18 ENCOUNTER — Other Ambulatory Visit: Payer: Self-pay | Admitting: *Deleted

## 2013-02-18 ENCOUNTER — Ambulatory Visit (HOSPITAL_BASED_OUTPATIENT_CLINIC_OR_DEPARTMENT_OTHER): Payer: Medicare Other

## 2013-02-18 ENCOUNTER — Encounter: Payer: Self-pay | Admitting: *Deleted

## 2013-02-18 DIAGNOSIS — C61 Malignant neoplasm of prostate: Secondary | ICD-10-CM

## 2013-02-18 DIAGNOSIS — E860 Dehydration: Secondary | ICD-10-CM

## 2013-02-18 MED ORDER — SODIUM CHLORIDE 0.9 % IV SOLN
Freq: Once | INTRAVENOUS | Status: AC
  Administered 2013-02-18: 15:00:00 via INTRAVENOUS

## 2013-02-18 MED ORDER — ONDANSETRON 8 MG/50ML IVPB (CHCC)
8.0000 mg | Freq: Once | INTRAVENOUS | Status: AC
  Administered 2013-02-18: 8 mg via INTRAVENOUS

## 2013-02-18 MED ORDER — ONDANSETRON 8 MG/NS 50 ML IVPB
INTRAVENOUS | Status: AC
  Filled 2013-02-18: qty 8

## 2013-02-18 NOTE — Progress Notes (Signed)
1650. Patient d/c ambulating with spouse. Denies nausea, dizziness or any other symptoms. Refusing to stay for VS states "he feels fine." Encouraged pt to call clinic should he have any questions or concerns.

## 2013-02-18 NOTE — Progress Notes (Unsigned)
Patient calling to say he has vomited 2 times and is using compazine suppositories, and tried to take a zofran, but gagged. Would like to get iv fluids today. Yes per dr Clelia Croft. Infusion can take patient @ 2:30. Today. Patient notified.

## 2013-02-18 NOTE — Patient Instructions (Signed)
Dehydration, Adult Dehydration is when you lose more fluids from the body than you take in. Vital organs like the kidneys, brain, and heart cannot function without a proper amount of fluids and salt. Any loss of fluids from the body can cause dehydration.  CAUSES   Vomiting.  Diarrhea.  Excessive sweating.  Excessive urine output.  Fever. SYMPTOMS  Mild dehydration  Thirst.  Dry lips.  Slightly dry mouth. Moderate dehydration  Very dry mouth.  Sunken eyes.  Skin does not bounce back quickly when lightly pinched and released.  Dark urine and decreased urine production.  Decreased tear production.  Headache. Severe dehydration  Very dry mouth.  Extreme thirst.  Rapid, weak pulse (more than 100 beats per minute at rest).  Cold hands and feet.  Not able to sweat in spite of heat and temperature.  Rapid breathing.  Blue lips.  Confusion and lethargy.  Difficulty being awakened.  Minimal urine production.  No tears. DIAGNOSIS  Your caregiver will diagnose dehydration based on your symptoms and your exam. Blood and urine tests will help confirm the diagnosis. The diagnostic evaluation should also identify the cause of dehydration. TREATMENT  Treatment of mild or moderate dehydration can often be done at home by increasing the amount of fluids that you drink. It is best to drink small amounts of fluid more often. Drinking too much at one time can make vomiting worse. Refer to the home care instructions below. Severe dehydration needs to be treated at the hospital where you will probably be given intravenous (IV) fluids that contain water and electrolytes. HOME CARE INSTRUCTIONS   Ask your caregiver about specific rehydration instructions.  Drink enough fluids to keep your urine clear or pale yellow.  Drink small amounts frequently if you have nausea and vomiting.  Eat as you normally do.  Avoid:  Foods or drinks high in sugar.  Carbonated  drinks.  Juice.  Extremely hot or cold fluids.  Drinks with caffeine.  Fatty, greasy foods.  Alcohol.  Tobacco.  Overeating.  Gelatin desserts.  Wash your hands well to avoid spreading bacteria and viruses.  Only take over-the-counter or prescription medicines for pain, discomfort, or fever as directed by your caregiver.  Ask your caregiver if you should continue all prescribed and over-the-counter medicines.  Keep all follow-up appointments with your caregiver. SEEK MEDICAL CARE IF:  You have abdominal pain and it increases or stays in one area (localizes).  You have a rash, stiff neck, or severe headache.  You are irritable, sleepy, or difficult to awaken.  You are weak, dizzy, or extremely thirsty. SEEK IMMEDIATE MEDICAL CARE IF:   You are unable to keep fluids down or you get worse despite treatment.  You have frequent episodes of vomiting or diarrhea.  You have blood or green matter (bile) in your vomit.  You have blood in your stool or your stool looks black and tarry.  You have not urinated in 6 to 8 hours, or you have only urinated a small amount of very dark urine.  You have a fever.  You faint. MAKE SURE YOU:   Understand these instructions.  Will watch your condition.  Will get help right away if you are not doing well or get worse. Document Released: 04/16/2005 Document Revised: 07/09/2011 Document Reviewed: 12/04/2010 Exit[employer] Patient Information 2014 [employer].  

## 2013-02-23 ENCOUNTER — Encounter (HOSPITAL_COMMUNITY)
Admission: RE | Disposition: A | Discharge status: Home / Self Care | Payer: Self-pay | Source: Ambulatory Visit | Attending: Urology

## 2013-02-23 ENCOUNTER — Ambulatory Visit (HOSPITAL_COMMUNITY): Payer: Medicare Other | Admitting: Anesthesiology

## 2013-02-23 ENCOUNTER — Encounter (HOSPITAL_COMMUNITY): Payer: Self-pay | Admitting: Anesthesiology

## 2013-02-23 ENCOUNTER — Encounter (HOSPITAL_COMMUNITY): Payer: Medicare Other | Admitting: Anesthesiology

## 2013-02-23 ENCOUNTER — Ambulatory Visit (HOSPITAL_COMMUNITY)
Admission: RE | Admit: 2013-02-23 | Discharge: 2013-02-23 | Disposition: A | Discharge status: Home / Self Care | Payer: Medicare Other | Source: Ambulatory Visit | Attending: Urology | Admitting: Urology

## 2013-02-23 DIAGNOSIS — R300 Dysuria: Secondary | ICD-10-CM | POA: Insufficient documentation

## 2013-02-23 DIAGNOSIS — N135 Crossing vessel and stricture of ureter without hydronephrosis: Secondary | ICD-10-CM | POA: Insufficient documentation

## 2013-02-23 DIAGNOSIS — C61 Malignant neoplasm of prostate: Secondary | ICD-10-CM | POA: Insufficient documentation

## 2013-02-23 DIAGNOSIS — E291 Testicular hypofunction: Secondary | ICD-10-CM | POA: Insufficient documentation

## 2013-02-23 DIAGNOSIS — M549 Dorsalgia, unspecified: Secondary | ICD-10-CM | POA: Insufficient documentation

## 2013-02-23 DIAGNOSIS — C7951 Secondary malignant neoplasm of bone: Secondary | ICD-10-CM | POA: Insufficient documentation

## 2013-02-23 DIAGNOSIS — Z87891 Personal history of nicotine dependence: Secondary | ICD-10-CM | POA: Insufficient documentation

## 2013-02-23 DIAGNOSIS — R32 Unspecified urinary incontinence: Secondary | ICD-10-CM | POA: Insufficient documentation

## 2013-02-23 DIAGNOSIS — R109 Unspecified abdominal pain: Secondary | ICD-10-CM | POA: Insufficient documentation

## 2013-02-23 DIAGNOSIS — Z9079 Acquired absence of other genital organ(s): Secondary | ICD-10-CM | POA: Insufficient documentation

## 2013-02-23 DIAGNOSIS — M899 Disorder of bone, unspecified: Secondary | ICD-10-CM | POA: Insufficient documentation

## 2013-02-23 HISTORY — PX: CYSTOSCOPY W/ URETERAL STENT PLACEMENT: SHX1429

## 2013-02-23 HISTORY — DX: Secondary malignant neoplasm of unspecified site: C79.9

## 2013-02-23 SURGERY — CYSTOSCOPY, FLEXIBLE, WITH STENT REPLACEMENT
Anesthesia: General | Laterality: Left | Wound class: Clean Contaminated

## 2013-02-23 MED ORDER — FENTANYL CITRATE 0.05 MG/ML IJ SOLN
25.0000 ug | INTRAMUSCULAR | Status: DC | PRN
  Administered 2013-02-23 (×2): 50 ug via INTRAVENOUS

## 2013-02-23 MED ORDER — DEXAMETHASONE SODIUM PHOSPHATE 10 MG/ML IJ SOLN
INTRAMUSCULAR | Status: DC | PRN
  Administered 2013-02-23: 10 mg via INTRAVENOUS

## 2013-02-23 MED ORDER — LACTATED RINGERS IV SOLN
INTRAVENOUS | Status: DC
  Administered 2013-02-23: 1000 mL via INTRAVENOUS

## 2013-02-23 MED ORDER — CIPROFLOXACIN IN D5W 400 MG/200ML IV SOLN
400.0000 mg | INTRAVENOUS | Status: AC
  Administered 2013-02-23: 400 mg via INTRAVENOUS

## 2013-02-23 MED ORDER — ONDANSETRON HCL 4 MG/2ML IJ SOLN
INTRAMUSCULAR | Status: DC | PRN
  Administered 2013-02-23: 4 mg via INTRAVENOUS

## 2013-02-23 MED ORDER — CIPROFLOXACIN IN D5W 400 MG/200ML IV SOLN
INTRAVENOUS | Status: AC
  Filled 2013-02-23: qty 200

## 2013-02-23 MED ORDER — FENTANYL CITRATE 0.05 MG/ML IJ SOLN
INTRAMUSCULAR | Status: AC
  Filled 2013-02-23: qty 2

## 2013-02-23 MED ORDER — PROPOFOL 10 MG/ML IV BOLUS
INTRAVENOUS | Status: DC | PRN
  Administered 2013-02-23: 200 mg via INTRAVENOUS

## 2013-02-23 MED ORDER — KETAMINE HCL 10 MG/ML IJ SOLN
INTRAMUSCULAR | Status: DC | PRN
  Administered 2013-02-23: 30 mg via INTRAVENOUS

## 2013-02-23 MED ORDER — PHENYLEPHRINE HCL 10 MG/ML IJ SOLN
INTRAMUSCULAR | Status: DC | PRN
  Administered 2013-02-23 (×2): 80 ug via INTRAVENOUS

## 2013-02-23 MED ORDER — STERILE WATER FOR IRRIGATION IR SOLN
Status: DC | PRN
  Administered 2013-02-23: 3000 mL

## 2013-02-23 MED ORDER — MORPHINE SULFATE 10 MG/ML IJ SOLN
2.0000 mg | Freq: Once | INTRAMUSCULAR | Status: AC
  Administered 2013-02-23: 2 mg via INTRAVENOUS
  Filled 2013-02-23: qty 1

## 2013-02-23 MED ORDER — HYDROMORPHONE HCL PF 1 MG/ML IJ SOLN
INTRAMUSCULAR | Status: DC | PRN
  Administered 2013-02-23: 1 mg via INTRAVENOUS

## 2013-02-23 MED ORDER — LIDOCAINE HCL (CARDIAC) 20 MG/ML IV SOLN
INTRAVENOUS | Status: DC | PRN
  Administered 2013-02-23: 100 mg via INTRAVENOUS

## 2013-02-23 SURGICAL SUPPLY — 13 items
BAG URO CATCHER STRL LF (DRAPE) ×2 IMPLANT
BASKET ZERO TIP NITINOL 2.4FR (BASKET) IMPLANT
CATH INTERMIT  6FR 70CM (CATHETERS) IMPLANT
CLOTH BEACON ORANGE TIMEOUT ST (SAFETY) ×2 IMPLANT
DRAPE CAMERA CLOSED 9X96 (DRAPES) ×2 IMPLANT
GLOVE BIOGEL M STRL SZ7.5 (GLOVE) ×2 IMPLANT
GOWN PREVENTION PLUS LG XLONG (DISPOSABLE) ×4 IMPLANT
GUIDEWIRE ANG ZIPWIRE 038X150 (WIRE) IMPLANT
GUIDEWIRE STR DUAL SENSOR (WIRE) ×2 IMPLANT
MANIFOLD NEPTUNE II (INSTRUMENTS) ×2 IMPLANT
PACK CYSTO (CUSTOM PROCEDURE TRAY) ×2 IMPLANT
STENT CONTOUR 6FRX24X.038 (STENTS) ×2 IMPLANT
TUBING CONNECTING 10 (TUBING) ×2 IMPLANT

## 2013-02-23 NOTE — Anesthesia Postprocedure Evaluation (Signed)
  Anesthesia Post-op Note  Patient: Willie Gaines  Procedure(s) Performed: Procedure(s) (LRB): CYSTOSCOPY WITH STENT REPLACEMENT (Left)  Patient Location: PACU  Anesthesia Type: General  Level of Consciousness: awake and alert   Airway and Oxygen Therapy: Patient Spontanous Breathing  Post-op Pain: mild  Post-op Assessment: Post-op Vital signs reviewed, Patient's Cardiovascular Status Stable, Respiratory Function Stable, Patent Airway and No signs of Nausea or vomiting  Last Vitals:  Filed Vitals:   02/23/13 1532  BP: 143/95  Pulse: 86  Temp: 36.6 C  Resp: 14    Post-op Vital Signs: stable   Complications: No apparent anesthesia complications

## 2013-02-23 NOTE — Progress Notes (Signed)
Patient stated he feels much better after morphine IV. Doesn't want to take oxycodone prior to d/c home. Has voided and verbalizes understanding of d/c instructions. Home with wife.

## 2013-02-23 NOTE — Preoperative (Signed)
Beta Blockers   Reason not to administer Beta Blockers:Not Applicable 

## 2013-02-23 NOTE — H&P (Signed)
History of Present Illness  Mr. Willie Gaines is a 72 year old with the following urologic history:  1) Metastatic prostate cancer: He is s/p a non-nerve sparing RAL radical prostectomy on January 01, 2008. His postoperative course was complicated by a urine leak which resolved with catheter drainage. His pathology demonstrated pT3b N0 Mx (16 nodes negative), Gleason 4+3=7 (tertiary pattern 5) adenocarcinoma with positive surgical margins at the seminal vesicles. His PSA initially became undetectable after surgery and he proceeded with adjuvant radiation therapy which he completed in June 2010. He developed a biochemical recurrence in October 2010. 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 He began androgen deprivation with Lupron in August 2011. 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 He enrolled in the STRIVE trial in May 2013 randomizing patients to enzalutimide vs. bicalutimide. He was noted to have progression of his bone metastases in September 2013 as well as a rapid increase in his PSA and came off study in early October 2013.  He then began treatment with abiraterone under the care of Dr. Clelia Croft in October 2013. He progressed and began docetaxel chemotherapy in June 2014.  2) Left ureteral obstruction: This was diagnosed in August 2011 and it has been managed with ureteral stent drainage. Last stent: 10/13/12 (6 x 24)  3) Testosterone deficiency/osteopenia/CRPC with bone metastases: He has a history of osteopenia and takes Vitamin D and calcium supplementation for osteoporosis prevention. Current therapy: Lupron 30 mg (09/25/12), docetaxel Sep 2013: Normal (improved from baseline) Bone treatment:  Xgeva 120 mg monthly  Interval history:  He follows up today for further management of his left ureteral obstruction and metastatic prostate cancer.  He continues to receive docetaxel chemotherapy and is scheduled for his seventh cycle to begin next week.  His most recent PSA was 198.  He also had electrolytes and his serum calcium level was 8.3 last week.  He admits that he has not been very compliant with taking his calcium supplement.  He'll continue to receive Xgeva 129 g monthly.  He received this last one week ago.  He continues to have intermittent urinary symptoms including terminal dysuria and incontinence.  This does vary quite significantly a wide days where he is completely continent and other days where he requires 2-3 undergarment.  He did start developing significant symptoms yesterday and in particular terminal dysuria.     Past Medical History Problems  1. History of  Acute Pancreatitis 577.0 2. 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 Prostate Cancer 185  Surgical History Problems  1. History of  Appendectomy 2. History of  Cystoscopy With Insertion Of Ureteral Stent Left 3. History of  Cystoscopy With Insertion Of Ureteral Stent Left 4. History of  Cystoscopy With Insertion Of Ureteral Stent Left 5. History of  Cystoscopy With Insertion Of Ureteral Stent Left 6. History of  Cystoscopy With Insertion Of Ureteral Stent Left 7. History of  Cystoscopy With Insertion Of Ureteral Stent Left 8. History of  Cystoscopy With Insertion Of Ureteral Stent Left 9. History of  Cystoscopy With Insertion Of Ureteral Stent Left 10. History of  Cystoscopy With Insertion Of Ureteral Stent Left 11. History of  Ileostomy 12. History of  Ileostomy Closure  13. History of  Partial Colectomy 14. History of  Prostatect Retropubic Radical W/ Nerve Sparing Laparoscopic 15. History of  Rotator Cuff Repair Bilateral  Current Meds 1. Atorvastatin Calcium 10 MG Oral  Tablet; Therapy: 28Jun2013 to 2. Calcium 600 TABS; Therapy: (Recorded:07Jun2013) to 3. EQ Aspirin Low Dose 81 MG TABS; Therapy: (Recorded:17Apr2012) to 4. Iron TABS; Therapy: (Recorded:11Jun2013) to 5. Lipitor 20 MG Oral Tablet; Therapy: (Recorded:22Jan2014) to 6. Ondansetron HCl 8 MG Oral Tablet; Therapy: 11Mar2014 to 7. OxyCODONE HCl 5 MG Oral Tablet; Therapy: 27Sep2013 to 8. Zolpidem Tartrate 5 MG Oral Tablet; Therapy: 13Nov2013 to 9. Zytiga 250 MG Oral Tablet; Therapy: (Recorded:22Jan2014) to 10. ZzzQuil LIQD; Therapy: (Recorded:12Sep2013) to  Allergies Medication  1. No Known Drug Allergies  Family History Problems  1. Paternal history of  Adenocarcinoma Of The Pancreas 2. Maternal history of  Asthma V17.5 3. Fraternal history of  Prostate Cancer V16.42  Social History Problems  1. Alcohol Use 2. Former Smoker V15.82 quit smoking in 1981 3. Marital History - Currently Married 4. Occupation: retired Nurse, children's  5. History of  Tobacco Use  Vitals Vital Signs [Data Includes: Last 1 Day]  07Oct2014 12:50PM  BMI Calculated: 24.44 BSA Calculated: 1.9 Height: 5 ft 9 in Weight: 165 lb  Blood Pressure: 126 / 80 Heart Rate: 92  Physical Exam Constitutional: Well nourished and well developed . No acute distress.  Pulmonary: No respiratory distress and normal respiratory rhythm and effort.  Cardiovascular: Heart rate and rhythm are normal . No peripheral edema.    Results/Data Selected Results  UA With REFLEX 07Oct2014 12:43PM Heloise Purpura   Test Name Result Flag Reference  COLOR YELLOW  YELLOW  APPEARANCE CLEAR  CLEAR  SPECIFIC GRAVITY 1.020  1.005-1.030  pH 6.0  5.0-8.0  GLUCOSE NEG mg/dL  NEG  BILIRUBIN NEG  NEG  KETONE NEG mg/dL  NEG  BLOOD MOD A NEG  PROTEIN 100 mg/dL A NEG  UROBILINOGEN 0.2 mg/dL  1.6-1.0  NITRITE NEG  NEG  LEUKOCYTE ESTERASE NEG  NEG  SQUAMOUS EPITHELIAL/HPF FEW  RARE  WBC 3-6 WBC/hpf A <3  RBC 11-20 RBC/hpf A <3  BACTERIA FEW A RARE   CRYSTALS NONE SEEN  NONE SEEN  CASTS NONE SEEN  NONE SEEN  Other AMORPHOUS NOTED     Assessment Assessed  1. Prostate Cancer 185 2. 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 Ureteral Obstruction 593.4 4. Urinary Incontinence 788.30  Plan Prostate Cancer (185)  1. Lupron Depot 30 MG Intramuscular Kit; INJECT 30 MG Intramuscular; Done: 07Oct2014  01:44PM; Status: COMPLETE 2. Follow-up Month x 4 Office  Follow-up  Requested for: 11Feb2015 Urinary Incontinence (788.30)  3. PVR U/S Urinary Tract Infection (599.0), Urinary Incontinence (788.30)  4. Follow-up Weeks 1-2 Office  Follow-up  Discussion/Summary  1.  Metastatic castrate resistant prostate cancer: He will receive Lupron 30 mg today and plan to follow up in 4 months for continued androgen deprivation therapy.  He will continue his follow-up with Dr. Clelia Croft and continue with docetaxel as planned next week.  2.  Left ureteral obstruction: He will be scheduled for a left ureteral stent change in the next few weeks in between his chemotherapy treatments.  We have reviewed the potential risks, complications, alternative options, and expected recovery process associated with this procedure.  His urine has been cultured and he will be treated with appropriate antibiotic prophylaxis.  3.  Testosterone deficiency/osteopenia/bone metastases: He will continue treatment with XT 120 mg.  His serum calcium level is normal.  I again strongly recommended he continue his regular calcium supplementation on a daily basis.  4.  Lower urinary tract symptoms: He did not find Toviaz to be particularly helpful after trying that this summer.  He does not wish to proceed with chronic therapy at this time.  His urine will be cultured.  Cc: Dr. Eli Hose Dr. Elias Else Dr. Margaretmary Dys     Verified Results URINE CULTURE1 07Oct2014 01:22PM1 Lyda Perone  SOURCE : CLEAN CATCH SPECIMEN TYPE: CLEAN CATCH  [Feb 04, 2013 8:47PM Bert Ptacek,  Zacharey Jensen] Please notify patient that urine culture was negative. If he has recurrent symptoms that he thinks may indicate infection, he should call us so he can drop off a UA.   Test Name Result Flag Reference  CULTURE, URINE1 Culture, Urine1    ===== COLONY COUNT: =====  NO GROWTH   FINAL REPORT: NO GROWTH     1. Amended By: Heloise Purpura; 02/04/2013 8:47 PMEST  Signatures Electronically signed by : Heloise Purpura, [name]; Feb 04 2013  8:47PM

## 2013-02-23 NOTE — Anesthesia Preprocedure Evaluation (Addendum)
Anesthesia Evaluation  Patient identified by MRN, date of birth, ID band Patient awake    Reviewed: Allergy & Precautions, H&P , NPO status , Patient's Chart, lab work & pertinent test results  Airway Mallampati: II TM Distance: >3 FB Neck ROM: Full    Dental no notable dental hx.    Pulmonary former smoker,  breath sounds clear to auscultation  Pulmonary exam normal       Cardiovascular negative cardio ROS  + Valvular Problems/Murmurs Rhythm:Regular Rate:Normal  Most recent ECG and CXR reviewed.   Neuro/Psych negative neurological ROS  negative psych ROS   GI/Hepatic negative GI ROS, Neg liver ROS,   Endo/Other  negative endocrine ROS  Renal/GU negative Renal ROS  negative genitourinary   Musculoskeletal negative musculoskeletal ROS (+)   Abdominal   Peds negative pediatric ROS (+)  Hematology negative hematology ROS (+)   Anesthesia Other Findings   Reproductive/Obstetrics negative OB ROS                          Anesthesia Physical Anesthesia Plan  ASA: II  Anesthesia Plan: General   Post-op Pain Management:    Induction: Intravenous  Airway Management Planned: LMA  Additional Equipment:   Intra-op Plan:   Post-operative Plan: Extubation in OR  Informed Consent: I have reviewed the patients History and Physical, chart, labs and discussed the procedure including the risks, benefits and alternatives for the proposed anesthesia with the patient or authorized representative who has indicated his/her understanding and acceptance.   Dental advisory given  Plan Discussed with: CRNA  Anesthesia Plan Comments: (LMA #4 last time (June 2014))        Anesthesia Quick Evaluation

## 2013-02-23 NOTE — Interval H&P Note (Signed)
History and Physical Interval Note:  02/23/2013 1:22 PM  Willie Gaines  has presented today for surgery, with the diagnosis of LEFT URETERAL OBSTRUCTION  The various methods of treatment have been discussed with the patient and family. After consideration of risks, benefits and other options for treatment, the patient has consented to  Procedure(s): CYSTOSCOPY WITH STENT REPLACEMENT (Left) as a surgical intervention .  The patient's history has been reviewed, patient examined, no change in status, stable for surgery.  I have reviewed the patient's chart and labs.  Questions were answered to the patient's satisfaction.     Kanan Sobek,LES

## 2013-02-23 NOTE — Op Note (Signed)
Preoperative diagnosis:  1. Left ureteral obstruction 2. Metastatic prostate cancer   Postoperative diagnosis:  1. Left ureteral obstruction 2. Metastatic prostate cancer   Procedure:  1. Cystoscopy 2. Left ureteral stent placement (6 x 24)  Surgeon: Rolly Salter, Montez Hageman. [name]  Anesthesia: General  Complications: None  Intraoperative findings: The indwelling ureteral stent was noted to be severely encrusted at the distal curl.  EBL: Minimal  Specimens: None  Indication: Willie Gaines is a 72 y.o. patient with left ureteral obstruction. After reviewing the management options for treatment, he elected to proceed with the above surgical procedure(s). We have discussed the potential benefits and risks of the procedure, side effects of the proposed treatment, the likelihood of the patient achieving the goals of the procedure, and any potential problems that might occur during the procedure or recuperation. Informed consent has been obtained.  Description of procedure:  The patient was taken to the operating room and general anesthesia was induced.  The patient was placed in the dorsal lithotomy position, prepped and draped in the usual sterile fashion, and preoperative antibiotics were administered. A preoperative time-out was performed.   Cystourethroscopy was performed.  The patient's urethra was examined and was normal except for the notable finding of an absent prostate s/p radical prostatectomy. The bladder was then systematically examined in its entirety. There was no evidence for any bladder tumors, stones, or other mucosal pathology.    Attention then turned to the left ureteral orifice and the patient's indwelling ureteral stent was identified and brought out to the urethral meatus with the flexible graspers.  A 0.38 sensor guidewire was then advanced up the left ureter into the renal pelvis under fluoroscopic guidance.  The wire was then backloaded through the cystoscope and a  ureteral stent was advance over the wire using Seldinger technique.  The stent was positioned appropriately under fluoroscopic and cystoscopic guidance.  The wire was then removed with an adequate stent curl noted in the renal pelvis as well as in the bladder.  The bladder was then emptied and the procedure ended.  The patient appeared to tolerate the procedure well and without complications.  The patient was able to be awakened and transferred to the recovery unit in satisfactory condition.    Moody Bruins MD

## 2013-02-23 NOTE — Transfer of Care (Signed)
Immediate Anesthesia Transfer of Care Note  Patient: Willie Gaines  Procedure(s) Performed: Procedure(s): CYSTOSCOPY WITH STENT REPLACEMENT (Left)  Patient Location: PACU  Anesthesia Type:General  Level of Consciousness: sedated  Airway & Oxygen Therapy: Patient Spontanous Breathing and Patient connected to face mask oxygen  Post-op Assessment: Report given to PACU RN and Post -op Vital signs reviewed and stable  Post vital signs: Reviewed and stable  Complications: No apparent anesthesia complications

## 2013-02-23 NOTE — Progress Notes (Signed)
Patient back from PACU and stated pain in back /pelvis  8/10. Paged Dr. Laverle Patter and order received.

## 2013-02-24 ENCOUNTER — Encounter (HOSPITAL_COMMUNITY): Payer: Self-pay | Admitting: Urology

## 2013-02-25 ENCOUNTER — Other Ambulatory Visit: Payer: Self-pay | Admitting: *Deleted

## 2013-02-25 MED ORDER — HYDROMORPHONE HCL 2 MG PO TABS: 2.0000 mg | ORAL_TABLET | ORAL | Status: DC | PRN

## 2013-02-25 NOTE — Telephone Encounter (Signed)
Patient calling to say he had a stent put in on Monday and Tuesday he ran errands with his wife. Now he is hurting more and the oxycodone 5 mg 2 tablets is not helping. States his legs are hurting and his left hip is hurting. Per dr Clelia Croft, dilaudid 2 mg 1-2 tablets every 4 hours prn pain. Patient will p/u at front

## 2013-03-04 ENCOUNTER — Other Ambulatory Visit (HOSPITAL_BASED_OUTPATIENT_CLINIC_OR_DEPARTMENT_OTHER): Payer: Medicare Other | Admitting: Lab

## 2013-03-04 ENCOUNTER — Ambulatory Visit (HOSPITAL_BASED_OUTPATIENT_CLINIC_OR_DEPARTMENT_OTHER): Payer: Medicare Other | Admitting: Oncology

## 2013-03-04 ENCOUNTER — Ambulatory Visit: Payer: Self-pay

## 2013-03-04 ENCOUNTER — Telehealth: Payer: Self-pay | Admitting: Oncology

## 2013-03-04 ENCOUNTER — Encounter: Payer: Self-pay | Admitting: Nutrition

## 2013-03-04 VITALS — BP 146/89 | HR 116 | Temp 97.0°F | Resp 18 | Ht 69.0 in | Wt 158.7 lb

## 2013-03-04 DIAGNOSIS — C7951 Secondary malignant neoplasm of bone: Secondary | ICD-10-CM

## 2013-03-04 DIAGNOSIS — C61 Malignant neoplasm of prostate: Secondary | ICD-10-CM

## 2013-03-04 LAB — CBC WITH DIFFERENTIAL/PLATELET
BASO%: 0 % (ref 0.0–2.0)
EOS%: 0.3 % (ref 0.0–7.0)
HCT: 32.5 % — ABNORMAL LOW (ref 38.4–49.9)
LYMPH%: 8.6 % — ABNORMAL LOW (ref 14.0–49.0)
MCH: 22.8 pg — ABNORMAL LOW (ref 27.2–33.4)
MCHC: 32 g/dL (ref 32.0–36.0)
MONO%: 8.4 % (ref 0.0–14.0)
NEUT%: 82.7 % — ABNORMAL HIGH (ref 39.0–75.0)
Platelets: 373 10*3/uL (ref 140–400)
RDW: 16.1 % — ABNORMAL HIGH (ref 11.0–14.6)
lymph#: 0.8 10*3/uL — ABNORMAL LOW (ref 0.9–3.3)

## 2013-03-04 MED ORDER — SODIUM CHLORIDE 0.9 % IV SOLN: INTRAVENOUS | Status: DC

## 2013-03-04 MED ORDER — FENTANYL 25 MCG/HR TD PT72: 25.0000 ug | MEDICATED_PATCH | TRANSDERMAL | Status: DC

## 2013-03-04 NOTE — Telephone Encounter (Signed)
GV AND PRINTED APPT SCHED AND AVS FOR PT FOR DEC.Marland KitchenMarland KitchenPT GOING TO BE OUT OF TOWN THE WEEK OF THANKSGIVING.Marland KitchenMarland Kitchen

## 2013-03-04 NOTE — Progress Notes (Signed)
Hematology and Oncology Follow Up Visit  Willie Gaines [id number] Sep 27, 1940 72 y.o. 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 Diagnosed 2009. Gleason score 4+3 = 7 PSA 9.5  Prior Therapy: He underwent a radical prostatectomy on January 01, 2008, and the pathology from that, case number [id number], by Dr. Laverle Patter showed a prostatic adenocarcinoma, Gleason score of 4+3=7, involves both lobes. Tumor extends into the extracapsular tissue, involvement of the seminal vesicle. Lymph nodes were negative, and his pathological staging of T3b N0.  The patient did very well, did received adjuvant radiation therapy due to positive margins, and PSA was undetectable post surgery.  The patient developed biochemical relapse in October of 2010 July of 2011 and presented with metastatic disease and ureteral obstruction in August of 2011. The patient started with androgen deprivation at that time as well had a stent placement The patient did well initially with hormonal deprivation up until March 2013, where he developed castration-resistant disease, again with a bone scan showed  metastatic bony involvement. The patient was enrolled in the STRIVE trial, a randomized trial for enzalutamide versus bicalutamide and after  90 days of therapy, his PSA was up to 54 and a repeat bone scan on January 03, 2012, showed a progression of disease with increase widespread skeletal metastasis.  Zytiga 1000 mg daily started on 02/01/2012 and stopped in May 2014 due to disease progression.  He is s/p EBXRT to the spine and shoulders completed on 09/26/2012.    Current therapy: Taxotere started on 10/02/2012. He is here for the next cycle of chemotherapy.  Interim History:  Willie Gaines presents today for a follow up visit with his wife. The patient reports continues to do poorly with overall decline and his energy. He reports less nausea and  no vomiting. But his mobility have decreased on a daily basis. He is predominantly at home sitting in a chair most of the day. He walks around very short distances inside his house and rarely outside. He still able to drive short distances. He is reporting more diffuse bone pain at this time. His pain has been rated as high as 7/10 and is not focal. And have migrated from lower back and hips legs and shoulders. He is not reporting any neurological symptoms. Does not report any lower chin any weakness or neuropathy. Does not report any bowel or bladder dysfunction. I prescribed him Dilaudid which she has taking for breakthrough purposes and uses about 2-3 doses the day. His appetite has been poor I continued to lose weight. He felt that IV fluids helped him in the past although he still able to drink at this time.  Medications: I have reviewed the patient's current medications. Current Outpatient Prescriptions  Medication Sig Dispense Refill  . acetaminophen (TYLENOL) 500 MG tablet Take 1,000 mg by mouth every 6 (six) hours as needed for pain. Pt uses OTC for pain      . Calcium Citrate-Vitamin D (CITRACAL + D PO) Take 2 tablets by mouth daily. Patient currently taking as of 10/06/12.      [name] denosumab (XGEVA) 120 MG/1.7ML SOLN Inject 120 mg into the skin every 30 (thirty) days.      [name] dexamethasone (DECADRON) 4 MG tablet Take 1 tablet (4 mg total) by mouth daily.  30 tablet  1  . HYDROmorphone (DILAUDID) 2 MG tablet Take 1 tablet (2 mg total) by mouth every 4 (four) hours as  needed for pain (1 to 2 tablets every 4 hours prn pain).  30 tablet  0  . Leuprolide Acetate (LUPRON IJ) Inject as directed every 6 (six) months.       . lidocaine-prilocaine (EMLA) cream Apply 1 application topically daily as needed (Applies to port-a-cath.).      [name] LORazepam (ATIVAN) 0.5 MG tablet Take 1 tablet (0.5 mg total) by mouth every 8 (eight) hours as needed (Nausea/vomiting/anxiety).  30 tablet  0  . nystatin cream  (MYCOSTATIN) Apply topically as needed for dry skin. Apply to left axilla twice a day as needed.  30 g  1  . ondansetron (ZOFRAN) 8 MG tablet Take 1 tablet (8 mg total) by mouth every 12 (twelve) hours as needed for nausea.  20 tablet  2  . oxyCODONE (OXY IR/ROXICODONE) 5 MG immediate release tablet Take 1-2 tablets (5-10 mg total) by mouth every 4 (four) hours as needed for pain. PAIN  60 tablet  0  . prochlorperazine (COMPAZINE) 25 MG suppository Place 1 suppository (25 mg total) rectally every 6 (six) hours as needed for nausea.  12 suppository  1  . zolpidem (AMBIEN) 5 MG tablet Take 1 tablet (5 mg total) by mouth at bedtime as needed for sleep.  30 tablet  1  . fentaNYL (DURAGESIC - DOSED MCG/HR) 25 MCG/HR patch Place 1 patch (25 mcg total) onto the skin every 3 (three) days.  10 patch  0   Current Facility-Administered Medications  Medication Dose Route Frequency Provider Last Rate Last Dose  . 0.9 %  sodium chloride infusion   Intravenous Continuous Benjiman Core, MD      .  Allergies:  Allergies  Allergen Reactions  . Other     SOFT SHELL CRAB-HIVES ALL OTHER    Past Medical History, Surgical history, Social history, and Family History were reviewed and updated.  Review of Systems:  Remaining ROS negative.  Physical Exam: Blood pressure 146/89, pulse 116, temperature 97 F (36.1 C), temperature source Oral, resp. rate 18, height 5\' 9"  (1.753 m), weight 158 lb 11.2 oz (71.986 kg). ECOG: 3 General appearance: alert. Chronically ill appearing Head: Normocephalic, without obvious abnormality, atraumatic Neck: no adenopathy, no carotid bruit, no JVD, supple, symmetrical, trachea midline and thyroid not enlarged, symmetric, no tenderness/mass/nodules Lymph nodes: Cervical, supraclavicular, and axillary nodes normal. Heart:regular rate and rhythm, S1, S2 normal, systolic murmur: holosystolic 3/6, blowing at lower left sternal border and no rub Lung:chest clear, no wheezing, rales,  normal symmetric air entry Abdomen: soft, non-tender, without masses or organomegaly EXT:no erythema, induration, or nodules Skin: right should discoloration noted.  Neurological: No motor or sensory deficits. His gait and his mood all normal.  Lab Results: Lab Results  Component Value Date   WBC 9.0 03/04/2013   HGB 10.4* 03/04/2013   HCT 32.5* 03/04/2013   MCV 71.3* 03/04/2013   PLT 373 03/04/2013     Chemistry      Component Value Date/Time   NA 133* 02/10/2013 0858   NA 131* 02/07/2013 2200   K 4.2 02/10/2013 0858   K 4.6 02/07/2013 2200   CL 101 02/07/2013 2200   CL 107 10/02/2012 1255   CO2 19* 02/10/2013 0858   CO2 21 02/07/2013 2200   BUN 14.8 02/10/2013 0858   BUN 17 02/07/2013 2200   CREATININE 1.0 02/10/2013 0858   CREATININE 1.03 02/07/2013 2200      Component Value Date/Time   CALCIUM 8.5 02/10/2013 0858   CALCIUM 8.3*  02/07/2013 2200   ALKPHOS 177* 02/10/2013 0858   ALKPHOS 181* 02/07/2013 2200   AST 19 02/10/2013 0858   AST 20 02/07/2013 2200   ALT <6 02/10/2013 0858   ALT 6 02/07/2013 2200   BILITOT 0.47 02/10/2013 0858   BILITOT 0.3 02/07/2013 2200      Results for Willie Gaines, Willie Gaines (MRN [id number]) as of 03/04/2013 10:57  Ref. Range 01/21/2013 11:49 02/10/2013 08:58  PSA Latest Range: <=4.00 ng/mL 198.20 (H) 300.70 (H)    Impression and Plan: This is a 72 year old gentleman with the following issues:   1. Castration-resistant prostate cancer. He is on Taxotere.  His PSA clearly have continued to rise indicating rapid progression of his disease. The next step at this point is to restage him with CT scan and a bone scan. This will undoubtedly review rapid progression of his cancer. I had a long discussion today with Willie Gaines and his wife discussing the treatment options. Salvage chemotherapy with [facility] is one option versus supportive on hospice care. At this point, he is a marginal candidate for further chemotherapy given the deterioration and his decline  overall. I feel he is better suited for hospice care his prognosis is rather poor and this was discussed today in detail. He is agreeable to proceed with the staging workup and we will discuss which way he would like to proceed at that time. I recommended that if he decides not to go with chemotherapy on hospice enrollment will be the next step.  2. Back pain and L3 involvement of his cancer. Status post radiation therapy.    3. Hormonal deprivation. He continues to be on Lupron at [facility].   4. Bone-directed therapy. He is receiving Xgeva monthly at [facility].   5. Neutropenia prophylaxis: Neulasta no longer needed.  6. IV access: Port-A-Cath in place.  7. Pain: I have prescribed him fentanyl patch 25 mcg per hour for long acting pain control I encouraged him to use the Dilaudid for breakthrough purposes.  8. followup: In the next 2-3 weeks to discuss the next step.

## 2013-03-05 ENCOUNTER — Ambulatory Visit: Payer: Self-pay

## 2013-03-06 ENCOUNTER — Other Ambulatory Visit: Payer: Self-pay | Admitting: *Deleted

## 2013-03-06 MED ORDER — HYDROMORPHONE HCL 2 MG PO TABS: 2.0000 mg | ORAL_TABLET | ORAL | Status: DC | PRN

## 2013-03-10 ENCOUNTER — Telehealth: Payer: Self-pay | Admitting: *Deleted

## 2013-03-10 NOTE — Telephone Encounter (Signed)
No note

## 2013-03-10 NOTE — Telephone Encounter (Signed)
Wife calling to say patient is still in pain and the dilaudid 2 mg and fentanyl 25 mcg patches not helping. Per dr Clelia Croft, increase fentanyl to 50 mcg and take 2 dilaudid tabs every 2-4 hours prn pain. Wife verbalizes understanding.

## 2013-03-12 ENCOUNTER — Other Ambulatory Visit: Payer: Self-pay | Admitting: *Deleted

## 2013-03-12 MED ORDER — HYDROMORPHONE HCL 2 MG PO TABS: 2.0000 mg | ORAL_TABLET | ORAL | Status: DC | PRN

## 2013-03-12 NOTE — Telephone Encounter (Signed)
Wife calling for refill script on dilaudid, signed script left at front for patient p/u. Wife linda notified.

## 2013-03-17 ENCOUNTER — Encounter: Payer: Self-pay | Admitting: Medical Oncology

## 2013-03-17 ENCOUNTER — Other Ambulatory Visit: Payer: Self-pay | Admitting: Oncology

## 2013-03-18 ENCOUNTER — Encounter (HOSPITAL_COMMUNITY)
Admission: RE | Admit: 2013-03-18 | Discharge: 2013-03-18 | Disposition: A | Discharge status: Home / Self Care | Payer: Medicare Other | Source: Ambulatory Visit | Attending: Oncology | Admitting: Oncology

## 2013-03-18 ENCOUNTER — Encounter (HOSPITAL_COMMUNITY): Payer: Self-pay

## 2013-03-18 ENCOUNTER — Ambulatory Visit (HOSPITAL_COMMUNITY)
Admission: RE | Admit: 2013-03-18 | Discharge: 2013-03-18 | Disposition: A | Discharge status: Home / Self Care | Payer: Medicare Other | Source: Ambulatory Visit | Attending: Oncology | Admitting: Oncology

## 2013-03-18 DIAGNOSIS — N269 Renal sclerosis, unspecified: Secondary | ICD-10-CM | POA: Insufficient documentation

## 2013-03-18 DIAGNOSIS — I359 Nonrheumatic aortic valve disorder, unspecified: Secondary | ICD-10-CM | POA: Insufficient documentation

## 2013-03-18 DIAGNOSIS — C7951 Secondary malignant neoplasm of bone: Secondary | ICD-10-CM | POA: Insufficient documentation

## 2013-03-18 DIAGNOSIS — C61 Malignant neoplasm of prostate: Secondary | ICD-10-CM | POA: Insufficient documentation

## 2013-03-18 DIAGNOSIS — I2510 Atherosclerotic heart disease of native coronary artery without angina pectoris: Secondary | ICD-10-CM | POA: Insufficient documentation

## 2013-03-18 DIAGNOSIS — I7090 Unspecified atherosclerosis: Secondary | ICD-10-CM | POA: Insufficient documentation

## 2013-03-18 DIAGNOSIS — N289 Disorder of kidney and ureter, unspecified: Secondary | ICD-10-CM | POA: Insufficient documentation

## 2013-03-18 DIAGNOSIS — E279 Disorder of adrenal gland, unspecified: Secondary | ICD-10-CM | POA: Insufficient documentation

## 2013-03-18 DIAGNOSIS — Z79899 Other long term (current) drug therapy: Secondary | ICD-10-CM | POA: Insufficient documentation

## 2013-03-18 DIAGNOSIS — C787 Secondary malignant neoplasm of liver and intrahepatic bile duct: Secondary | ICD-10-CM | POA: Insufficient documentation

## 2013-03-18 DIAGNOSIS — Z9079 Acquired absence of other genital organ(s): Secondary | ICD-10-CM | POA: Insufficient documentation

## 2013-03-18 MED ORDER — TECHNETIUM TC 99M MEDRONATE IV KIT
24.6000 | PACK | Freq: Once | INTRAVENOUS | Status: AC | PRN
  Administered 2013-03-18: 24.6 via INTRAVENOUS

## 2013-03-18 MED ORDER — IOHEXOL 300 MG/ML  SOLN
100.0000 mL | Freq: Once | INTRAMUSCULAR | Status: AC | PRN
  Administered 2013-03-18: 100 mL via INTRAVENOUS

## 2013-03-19 ENCOUNTER — Other Ambulatory Visit: Payer: Self-pay | Admitting: Medical Oncology

## 2013-03-19 DIAGNOSIS — C61 Malignant neoplasm of prostate: Secondary | ICD-10-CM

## 2013-03-19 MED ORDER — ZOLPIDEM TARTRATE 5 MG PO TABS: 5.0000 mg | ORAL_TABLET | Freq: Every evening | ORAL | Status: AC | PRN

## 2013-03-19 MED ORDER — FENTANYL 25 MCG/HR TD PT72: 25.0000 ug | MEDICATED_PATCH | TRANSDERMAL | Status: DC

## 2013-03-19 MED ORDER — HYDROMORPHONE HCL 2 MG PO TABS: 2.0000 mg | ORAL_TABLET | ORAL | Status: DC | PRN

## 2013-03-19 NOTE — Telephone Encounter (Signed)
Patient LVMOM requesting refill for Ambien, dilaudid and fentanyl patch, will be out of town next week and will run out of patches. Per MD, ok to refill. Patient informed and will p.u. Expressed thanks, no further questions at this time.

## 2013-03-20 ENCOUNTER — Other Ambulatory Visit: Payer: Self-pay | Admitting: Medical Oncology

## 2013-03-20 DIAGNOSIS — C61 Malignant neoplasm of prostate: Secondary | ICD-10-CM

## 2013-03-20 MED ORDER — FENTANYL 50 MCG/HR TD PT72: 50.0000 ug | MEDICATED_PATCH | TRANSDERMAL | Status: DC

## 2013-03-31 ENCOUNTER — Telehealth: Payer: Self-pay | Admitting: Oncology

## 2013-03-31 ENCOUNTER — Ambulatory Visit (HOSPITAL_BASED_OUTPATIENT_CLINIC_OR_DEPARTMENT_OTHER): Payer: Medicare Other | Admitting: Oncology

## 2013-03-31 VITALS — BP 128/75 | HR 111 | Temp 98.3°F | Resp 17 | Ht 69.0 in | Wt 143.9 lb

## 2013-03-31 DIAGNOSIS — C7951 Secondary malignant neoplasm of bone: Secondary | ICD-10-CM

## 2013-03-31 DIAGNOSIS — C787 Secondary malignant neoplasm of liver and intrahepatic bile duct: Secondary | ICD-10-CM

## 2013-03-31 DIAGNOSIS — C61 Malignant neoplasm of prostate: Secondary | ICD-10-CM

## 2013-03-31 MED ORDER — HYDROMORPHONE HCL 2 MG PO TABS: 2.0000 mg | ORAL_TABLET | ORAL | Status: DC | PRN

## 2013-03-31 MED ORDER — MEGESTROL ACETATE 625 MG/5ML PO SUSP: 625.0000 mg | Freq: Every day | ORAL | Status: AC

## 2013-03-31 MED ORDER — FENTANYL 100 MCG/HR TD PT72: 100.0000 ug | MEDICATED_PATCH | TRANSDERMAL | Status: AC

## 2013-03-31 NOTE — Telephone Encounter (Signed)
Gave pt appt for MD only on 12/31

## 2013-03-31 NOTE — Progress Notes (Signed)
Hematology and Oncology Follow Up Visit  Willie Gaines [id number] 1940/05/21 72 y.o. 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 Diagnosed 2009. Gleason score 4+3 = 7 PSA 9.5  Prior Therapy: He underwent a radical prostatectomy on January 01, 2008, and the pathology from that, case number [id number], by Dr. Laverle Gaines showed a prostatic adenocarcinoma, Gleason score of 4+3=7, involves both lobes. Tumor extends into the extracapsular tissue, involvement of the seminal vesicle. Lymph nodes were negative, and his pathological staging of T3b N0.  The patient did very well, did received adjuvant radiation therapy due to positive margins, and PSA was undetectable post surgery.  The patient developed biochemical relapse in October of 2010 July of 2011 and presented with metastatic disease and ureteral obstruction in August of 2011. The patient started with androgen deprivation at that time as well had a stent placement The patient did well initially with hormonal deprivation up until March 2013, where he developed castration-resistant disease, again with a bone scan showed  metastatic bony involvement. The patient was enrolled in the STRIVE trial, a randomized trial for enzalutamide versus bicalutamide and after  90 days of therapy, his PSA was up to 54 and a repeat bone scan on January 03, 2012, showed a progression of disease with increase widespread skeletal metastasis.  Zytiga 1000 mg daily started on 02/01/2012 and stopped in May 2014 due to disease progression.  He is s/p EBXRT to the spine and shoulders completed on 09/26/2012.  Taxotere started on 10/02/2012. Last treatment given on 01/21/2013 due to progression of disease.  Current therapy: Supportive care only  Interim History:  Willie Gaines presents today for a follow up visit with his wife. The patient reports continues to do poorly with overall decline and  his energy and performance status. Hiss mobility have decreased on a daily basis. He is predominantly at home sitting in a chair most of the day. He walks around very short distances inside his house and rarely outside. He still able to drive short distances. He is reporting more diffuse bone pain at this time. His pain has been rated as high as 9/10 and is not focal despite being on fentanyl patches total of 50 mcg every 3 days. His appetite has been poor I continued to lose weight.  Medications: I have reviewed the patient's current medications. Current Outpatient Prescriptions  Medication Sig Dispense Refill  . acetaminophen (TYLENOL) 500 MG tablet Take 1,000 mg by mouth every 6 (six) hours as needed for pain. Pt uses OTC for pain      . Calcium Citrate-Vitamin D (CITRACAL + D PO) Take 2 tablets by mouth daily. Patient currently taking as of 10/06/12.      [name] denosumab (XGEVA) 120 MG/1.7ML SOLN Inject 120 mg into the skin every 30 (thirty) days.      [name] dexamethasone (DECADRON) 4 MG tablet Take 1 tablet (4 mg total) by mouth daily.  30 tablet  1  . fentaNYL (DURAGESIC - DOSED MCG/HR) 100 MCG/HR Place 1 patch (100 mcg total) onto the skin every 3 (three) days.  10 patch  0  . HYDROmorphone (DILAUDID) 2 MG tablet Take 1 tablet (2 mg total) by mouth every 4 (four) hours as needed.  90 tablet  0  . Leuprolide Acetate (LUPRON IJ) Inject as directed every 6 (six) months.       . lidocaine-prilocaine (EMLA) cream Apply 1 application topically daily as needed (  Applies to port-a-cath.).      [name] LORazepam (ATIVAN) 0.5 MG tablet Take 1 tablet (0.5 mg total) by mouth every 8 (eight) hours as needed (Nausea/vomiting/anxiety).  30 tablet  0  . nystatin cream (MYCOSTATIN) Apply topically as needed for dry skin. Apply to left axilla twice a day as needed.  30 g  1  . ondansetron (ZOFRAN) 8 MG tablet Take 1 tablet (8 mg total) by mouth every 12 (twelve) hours as needed for nausea.  20 tablet  2  . oxyCODONE (OXY  IR/ROXICODONE) 5 MG immediate release tablet Take 1-2 tablets (5-10 mg total) by mouth every 4 (four) hours as needed for pain. PAIN  60 tablet  0  . prochlorperazine (COMPAZINE) 25 MG suppository Place 1 suppository (25 mg total) rectally every 6 (six) hours as needed for nausea.  12 suppository  1  . zolpidem (AMBIEN) 5 MG tablet Take 1 tablet (5 mg total) by mouth at bedtime as needed for sleep.  30 tablet  1  . megestrol (MEGACE ES) 625 MG/5ML suspension Take 5 mLs (625 mg total) by mouth daily.  150 mL  0   No current facility-administered medications for this visit.  .  Allergies:  Allergies  Allergen Reactions  . Other     SOFT SHELL CRAB-HIVES ALL OTHER    Past Medical History, Surgical history, Social history, and Family History were reviewed and updated.  Review of Systems:  Remaining ROS negative.  Physical Exam: Blood pressure 128/75, pulse 111, temperature 98.3 F (36.8 C), temperature source Oral, resp. rate 17, height 5\' 9"  (1.753 m), weight 143 lb 14.4 oz (65.273 kg), SpO2 99.00%. ECOG: 3 General appearance: alert. Chronically ill appearing Head: Normocephalic, without obvious abnormality, atraumatic Neck: no adenopathy, no carotid bruit, no JVD, supple, symmetrical, trachea midline and thyroid not enlarged, symmetric, no tenderness/mass/nodules Lymph nodes: Cervical, supraclavicular, and axillary nodes normal. Heart:regular rate and rhythm, S1, S2 normal, systolic murmur: holosystolic 3/6, blowing at lower left sternal border and no rub Lung:chest clear, no wheezing, rales, normal symmetric air entry Abdomen: soft, non-tender, without masses or organomegaly EXT:no erythema, induration, or nodules Skin: right should discoloration noted.  Neurological: No motor or sensory deficits. His gait and his mood all normal.  Lab Results: Lab Results  Component Value Date   WBC 9.0 03/04/2013   HGB 10.4* 03/04/2013   HCT 32.5* 03/04/2013   MCV 71.3* 03/04/2013   PLT 373  03/04/2013     Chemistry      Component Value Date/Time   NA 133* 02/10/2013 0858   NA 131* 02/07/2013 2200   K 4.2 02/10/2013 0858   K 4.6 02/07/2013 2200   CL 101 02/07/2013 2200   CL 107 10/02/2012 1255   CO2 19* 02/10/2013 0858   CO2 21 02/07/2013 2200   BUN 14.8 02/10/2013 0858   BUN 17 02/07/2013 2200   CREATININE 1.0 02/10/2013 0858   CREATININE 1.03 02/07/2013 2200      Component Value Date/Time   CALCIUM 8.5 02/10/2013 0858   CALCIUM 8.3* 02/07/2013 2200   ALKPHOS 177* 02/10/2013 0858   ALKPHOS 181* 02/07/2013 2200   AST 19 02/10/2013 0858   AST 20 02/07/2013 2200   ALT <6 02/10/2013 0858   ALT 6 02/07/2013 2200   BILITOT 0.47 02/10/2013 0858   BILITOT 0.3 02/07/2013 2200     EXAM:  NUCLEAR MEDICINE WHOLE BODY BONE SCAN  TECHNIQUE:  Whole body anterior and posterior images were obtained approximately  3 hours  after intravenous injection of radiopharmaceutical.  COMPARISON: Bone scan 09/02/2012  RADIOPHARMACEUTICALS: 24.6 Technetium-99 MDP  FINDINGS:  There is diffuse osseous metastases within the axillary and  appendicular skeleton. Lesions within the ribs have increased in  confluence compared to prior. Lesion within the left proximal femur  is larger than prior. Activity in the right proximal humerus is  slightly less. No clear new lesions are identified. There is  increased activity in the central skullbase.  IMPRESSION:  Mild progression of widespread osseous metastasis. No pathologic  fractures evident by scintigraphy.   EXAM:  CT ABDOMEN AND PELVIS WITH CONTRAST  TECHNIQUE:  Multidetector CT imaging of the abdomen and pelvis was performed  using the standard protocol following bolus administration of  intravenous contrast.  CONTRAST: OMNIPAQUE IOHEXOL 300 MG/ML SOLN  COMPARISON: CT of the abdomen and pelvis 09/02/2012.  FINDINGS:  Lung Bases: Calcifications of the aortic valve. Atherosclerotic  calcifications in the distal left circumflex,  left anterior  descending and right coronary arteries.  Abdomen/Pelvis: Compared to the prior examination there has been a  significant increase in the number and size of numerous hepatic  nodules and masses. The largest single lesion on today's examination  is within segment 4 of the liver measuring approximately 4.0 x 2.7  cm (image 17 of series 2). The appearance of the gallbladder,  pancreas, spleen, left adrenal gland and right kidney is  unremarkable. 1.2 cm nodule in the medial limb of the right adrenal  gland is unchanged. Severe left renal atrophy, similar to the prior  study. 1.2 cm exophytic low-attenuation lesion extending off the  upper pole of the left kidney is unchanged, compatible with a simple  cyst. Left-sided double-J ureteral stent in position extending from  the left renal collecting system to the urinary bladder.  Atherosclerosis throughout the abdominal and pelvic vasculature,  without evidence aneurysm or dissection. No significant volume of  ascites. No pneumoperitoneum. No pathologic distention of small  bowel. Status post appendectomy. Status post radical prostatectomy.  No definite lymphadenopathy identified within the abdomen or pelvis.  Musculoskeletal: Multifocal sclerotic lesions again noted throughout  the axial and appendicular skeleton, very similar to the prior  examinations, compatible with widespread bony metastatic disease. No  definite pathologic fractures identified within the visualized  portions of the skeleton at this time.  IMPRESSION:  1. Today's study demonstrates progression of metastatic disease with  increased number and size of numerous hepatic lesions, as discussed  above.  2. Widespread skeletal metastatic disease appears very similar to  the prior study. No definite pathologic fractures are identified on  today's examination.  3. Unchanged indeterminate 1.2 cm right adrenal nodule.  4. Additional incidental findings, as above,  similar prior studies.  Electronically Signed    Impression and Plan: This is a 72 year old gentleman with the following issues:   1. Castration-resistant prostate cancer. He is on Taxotere.  His PSA clearly have continued to rise indicating rapid progression of his disease. CT scan and bone scan from 03/18/2013 were discussed today in the images were reviewed with the patient and his wife today. He clearly has progressed with his cancer and multiple areas both in the skeleton and hepatic metastasis. I have explained to him that that carries a poor prognosis especially for happens while he was on systemic chemotherapy. Options of treatments were discussed today including salvage Jevtana which I think he will be a poor candidate for versus best supportive care. I feel that the benefit from [place] is  very minimum and his performance status does not support any further chemotherapy. I feel he is better suited for hospice which he will think about and 1 we'll let me know in the near future.  2. Pain: I have increased his fentanyl patch to 100 mcg every 3 days with breakthrough pain medication in the form of Dilaudid.  3. Prognosis: Very poor with very limited life expectancy at this time

## 2013-04-10 ENCOUNTER — Telehealth: Payer: Self-pay | Admitting: *Deleted

## 2013-04-10 NOTE — Telephone Encounter (Signed)
Patient's wife linda calling this am. Tearfully requests hospice services. Ok per dr Clelia Croft, he will be the attending, ok for hospice MD's to do symptom management and to sign DNR. Spoke with josephine at hospice intake. She will speak with the family today.

## 2013-04-13 ENCOUNTER — Other Ambulatory Visit: Payer: Self-pay | Admitting: Hematology and Oncology

## 2013-04-13 ENCOUNTER — Telehealth: Payer: Self-pay | Admitting: Medical Oncology

## 2013-04-13 DIAGNOSIS — C61 Malignant neoplasm of prostate: Secondary | ICD-10-CM

## 2013-04-13 MED ORDER — HYDROMORPHONE HCL 4 MG PO TABS: 4.0000 mg | ORAL_TABLET | ORAL | Status: DC | PRN

## 2013-04-13 NOTE — Telephone Encounter (Signed)
Patients wife called for refill of dilaudid, states were only able to have 66 tablets, d/t insurance cap, instead of the 90 tablets written on prescription by MD. Jeanene Erb patient's pharmacy to confirm. Dr. Clelia Croft out of office, Dr. Bertis Ruddy signed for prescription and increased dilaudid to 4 mg by mouth every 4(four) hours as needed, disp 30 tablets. Wife informed, will p.u today. Expressed thanks, no further questions at this time.  Mssg forwarded to Dr. Clelia Croft for review.

## 2013-04-28 ENCOUNTER — Other Ambulatory Visit: Payer: Self-pay | Admitting: *Deleted

## 2013-04-28 MED ORDER — PREDNISONE 5 MG PO TABS: 10.0000 mg | ORAL_TABLET | Freq: Every day | ORAL | Status: AC

## 2013-04-28 MED ORDER — METHADONE HCL 10 MG PO TABS: 12.5000 mg | ORAL_TABLET | Freq: Three times a day (TID) | ORAL | Status: AC

## 2013-04-28 NOTE — Telephone Encounter (Signed)
Per hospice nurse teress, she and dr Gibson Ramp visited patient today, to try and get his pain under control. His methadone was increased to 12.5 mg a day and prednisone 10 mg daily for appetite stimulant. He remains on dilaudid 2 mg 1-2 tabs every 4 hours prn pain.

## 2013-04-29 ENCOUNTER — Ambulatory Visit (HOSPITAL_BASED_OUTPATIENT_CLINIC_OR_DEPARTMENT_OTHER): Payer: Medicare Other | Admitting: Oncology

## 2013-04-29 ENCOUNTER — Encounter (INDEPENDENT_AMBULATORY_CARE_PROVIDER_SITE_OTHER): Payer: Self-pay

## 2013-04-29 VITALS — BP 125/71 | HR 118 | Temp 98.4°F | Resp 21 | Ht 69.0 in | Wt 132.2 lb

## 2013-04-29 DIAGNOSIS — C61 Malignant neoplasm of prostate: Secondary | ICD-10-CM

## 2013-04-29 DIAGNOSIS — M899 Disorder of bone, unspecified: Secondary | ICD-10-CM

## 2013-04-29 DIAGNOSIS — C7951 Secondary malignant neoplasm of bone: Secondary | ICD-10-CM

## 2013-04-29 NOTE — Progress Notes (Signed)
Hematology and Oncology Follow Up Visit  Willie Gaines [id number] 10/14/1940 72 y.o. 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 Diagnosed 2009. Gleason score 4+3 = 7 PSA 9.5  Prior Therapy: He underwent a radical prostatectomy on January 01, 2008, and the pathology from that, case number [id number], by Dr. Laverle Patter showed a prostatic adenocarcinoma, Gleason score of 4+3=7, involves both lobes. Tumor extends into the extracapsular tissue, involvement of the seminal vesicle. Lymph nodes were negative, and his pathological staging of T3b N0.  The patient did very well, did received adjuvant radiation therapy due to positive margins, and PSA was undetectable post surgery.  The patient developed biochemical relapse in October of 2010 July of 2011 and presented with metastatic disease and ureteral obstruction in August of 2011. The patient started with androgen deprivation at that time as well had a stent placement The patient did well initially with hormonal deprivation up until March 2013, where he developed castration-resistant disease, again with a bone scan showed  metastatic bony involvement. The patient was enrolled in the STRIVE trial, a randomized trial for enzalutamide versus bicalutamide and after  90 days of therapy, his PSA was up to 54 and a repeat bone scan on January 03, 2012, showed a progression of disease with increase widespread skeletal metastasis.  Zytiga 1000 mg daily started on 02/01/2012 and stopped in May 2014 due to disease progression.  He is s/p EBXRT to the spine and shoulders completed on 09/26/2012.  Taxotere started on 10/02/2012. Last treatment given on 01/21/2013 due to progression of disease.  Current therapy: Supportive care only  Interim History:  Willie Gaines presents today for a follow up visit with his wife. The patient reports continues to do poorly with overall decline  and his energy and performance status. His mobility have decreased on a daily basis. He is predominantly at home sitting in a chair most of the day. He walks around very short distances inside his house and rarely outside. [name] He is reporting more diffuse bone pain at this time. His pain has been rated as high as 9/10 and currently on methadone for chronic pain control. He is mostly chair ridden at this time and under hospice care.  Medications: I have reviewed the patient's current medications. Current Outpatient Prescriptions  Medication Sig Dispense Refill  . acetaminophen (TYLENOL) 500 MG tablet Take 1,000 mg by mouth every 6 (six) hours as needed for pain. Pt uses OTC for pain      . Calcium Citrate-Vitamin D (CITRACAL + D PO) Take 2 tablets by mouth daily. Patient currently taking as of 10/06/12.      [name] denosumab (XGEVA) 120 MG/1.7ML SOLN Inject 120 mg into the skin every 30 (thirty) days.      [name] dexamethasone (DECADRON) 4 MG tablet Take 1 tablet (4 mg total) by mouth daily.  30 tablet  1  . fentaNYL (DURAGESIC - DOSED MCG/HR) 100 MCG/HR Place 1 patch (100 mcg total) onto the skin every 3 (three) days.  10 patch  0  . HYDROmorphone (DILAUDID) 2 MG tablet Take 1 tablet (2 mg total) by mouth every 4 (four) hours as needed.  90 tablet  0  . Leuprolide Acetate (LUPRON IJ) Inject as directed every 6 (six) months.       . lidocaine-prilocaine (EMLA) cream Apply 1 application topically daily as needed (Applies to port-a-cath.).      [name] LORazepam (ATIVAN) 0.5  MG tablet Take 1 tablet (0.5 mg total) by mouth every 8 (eight) hours as needed (Nausea/vomiting/anxiety).  30 tablet  0  . megestrol (MEGACE ES) 625 MG/5ML suspension Take 5 mLs (625 mg total) by mouth daily.  150 mL  0  . methadone (DOLOPHINE) 10 MG tablet Take 1.5 tablets (15 mg total) by mouth every 8 (eight) hours.    0  . nystatin cream (MYCOSTATIN) Apply topically as needed for dry skin. Apply to left axilla twice a day as needed.  30 g  1  .  ondansetron (ZOFRAN) 8 MG tablet Take 1 tablet (8 mg total) by mouth every 12 (twelve) hours as needed for nausea.  20 tablet  2  . oxyCODONE (OXY IR/ROXICODONE) 5 MG immediate release tablet Take 1-2 tablets (5-10 mg total) by mouth every 4 (four) hours as needed for pain. PAIN  60 tablet  0  . predniSONE (DELTASONE) 5 MG tablet Take 2 tablets (10 mg total) by mouth daily with breakfast.      . prochlorperazine (COMPAZINE) 25 MG suppository Place 1 suppository (25 mg total) rectally every 6 (six) hours as needed for nausea.  12 suppository  1  . zolpidem (AMBIEN) 5 MG tablet Take 1 tablet (5 mg total) by mouth at bedtime as needed for sleep.  30 tablet  1   No current facility-administered medications for this visit.  .  Allergies:  Allergies  Allergen Reactions  . Other     SOFT SHELL CRAB-HIVES ALL OTHER    Past Medical History, Surgical history, Social history, and Family History were reviewed and updated.  Review of Systems:  Remaining ROS negative.  Physical Exam: Blood pressure 125/71, pulse 118, temperature 98.4 F (36.9 C), temperature source Oral, resp. rate 21, height 5\' 9"  (1.753 m), weight 132 lb 3.2 oz (59.966 kg), SpO2 100.00%. ECOG: 3 General appearance: alert. Chronically ill appearing Head: Normocephalic, without obvious abnormality, atraumatic Neck: no adenopathy, no carotid bruit, no JVD, supple, symmetrical, trachea midline and thyroid not enlarged, symmetric, no tenderness/mass/nodules Lymph nodes: Cervical, supraclavicular, and axillary nodes normal. Heart:regular rate and rhythm, S1, S2 normal, systolic murmur: holosystolic 3/6, blowing at lower left sternal border and no rub Lung:chest clear, no wheezing, rales, normal symmetric air entry Abdomen: soft, non-tender, without masses or organomegaly EXT:no erythema, induration, or nodules Skin: right should discoloration noted.  Neurological: No motor or sensory deficits. His gait and his mood all  normal.    Impression and Plan: This is a 72 year old gentleman with the following issues:   1. Castration-resistant prostate cancer. He is clearly approaching end-stage status and under the care of hospice. I anticipate that he limited life expectancy at this time. I have not scheduled any further followups and we will rely on hospice updates regarding that. I've also discussed with him the possibility of a residential hospice if that becomes a further issue.   2. Pain: Currently on methadone which have been increased recently with modest benefit.  3. Prognosis: Very poor with very limited life expectancy at this time.

## 2013-05-04 ENCOUNTER — Telehealth: Payer: Self-pay | Admitting: *Deleted

## 2013-05-04 NOTE — Telephone Encounter (Signed)
PT. HAS BEEN CONFUSED, HALLUCINATING, AND WANDERING. HE HAS BEEN ON THE METHADONE 12.5MG  EVERY EIGHT HOURS WHICH HAS ALLOWED PT.'S WIFE TO DECREASE THE AMOUNT OF DILAUDID. DR.FELDMAN FEELS THE DECREASE IN THE DILAUDID MAY HAVE RESULTED IN PT.'S PRESENT CONDITION. DR.FELDMAN HAS INSTRUCTED PT.'S WIFE TO GIVE PT. DILAUDID 2MG  THREE TIMES A DAY TO SEE IF PT.'S CONDITION IMPROVES. ALSO PT. HAS 1+ EDEMA IN HIS FEET.

## 2013-05-12 ENCOUNTER — Telehealth: Payer: Self-pay | Admitting: *Deleted

## 2013-05-12 DIAGNOSIS — C61 Malignant neoplasm of prostate: Secondary | ICD-10-CM

## 2013-05-12 MED ORDER — HYDROMORPHONE HCL 2 MG PO TABS: ORAL_TABLET | ORAL | Status: AC

## 2013-05-12 NOTE — Telephone Encounter (Signed)
Milus Banister RN called reporting patient has 4 pills left of the Hydromorphine 2 mg tabs and needs refill sent to [facility] at [facility].

## 2013-05-13 ENCOUNTER — Telehealth: Payer: Self-pay | Admitting: *Deleted

## 2013-05-13 DIAGNOSIS — C61 Malignant neoplasm of prostate: Secondary | ICD-10-CM

## 2013-05-13 MED ORDER — DOCUSATE SODIUM 50 MG PO CAPS: 50.0000 mg | ORAL_CAPSULE | Freq: Two times a day (BID) | ORAL | Status: AC

## 2013-05-13 MED ORDER — POLYETHYLENE GLYCOL 3350 17 GM/SCOOP PO POWD: 1.0000 | Freq: Once | ORAL | Status: AC

## 2013-05-13 NOTE — Telephone Encounter (Signed)
Therese RN with Hospice called reporting Hospice is ordering Miralax patient's constipation.  Would like permission to change colace due to wife has been giving patient two pills per day instead of one.  He is still having trouble with bowels is why we're adding Miralax.  Therese informed to refill colace as taken by patient.  Will notify providers.

## 2013-06-02 ENCOUNTER — Telehealth: Payer: Self-pay | Admitting: *Deleted

## 2013-06-02 NOTE — Telephone Encounter (Signed)
teress with hospice calling with an update. Patient is bed bound, incontinent, difficulty processing, refusing his pain meds, only taking sips of water and juice. Has increased anxiety, they are going to increase his ativan to a liquid 4 times daily. Note to dr Hazeline Junker desk.

## 2013-06-05 ENCOUNTER — Telehealth: Payer: Self-pay | Admitting: *Deleted

## 2013-06-05 NOTE — Telephone Encounter (Signed)
Hospice nurse Willie Gaines calling with an update. Patient refusing pain meds. Methadone stopped. Placed on a fentanly patch 50 mcg, with dilaudid for break-thru pain, ativan for aggitation.

## 2013-06-09 ENCOUNTER — Encounter: Payer: Self-pay | Admitting: *Deleted

## 2013-06-28 NOTE — Progress Notes (Signed)
Received call from [facility] of [facility], patient expired today. No future appts in epic, note to dr shadad's desk.

## 2013-06-28 DEATH — deceased

## 2013-12-22 ENCOUNTER — Other Ambulatory Visit: Payer: Self-pay | Admitting: Pharmacist

## 2014-05-11 IMAGING — CT CT ABD-PELV W/ CM
1 of 3 series · 13 of 32 positions shown, 18 images · IV contrast (OMNIPAQUE)
Comparison: CT of the abdomen and pelvis 09/02/2012.

CLINICAL DATA: History of metastatic prostate cancer status post
radiation therapy. Ongoing chemotherapy.

EXAM:
CT ABDOMEN AND PELVIS WITH CONTRAST
TECHNIQUE: Multidetector CT imaging of the abdomen and pelvis was performed
using the standard protocol following bolus administration of
intravenous contrast.
CONTRAST:  100mL OMNIPAQUE IOHEXOL 300 MG/ML  SOLN

[Series 2: rtn ap with st · axial · 0.76mm/px · z∈[+809,+1189]mm · 13 of 86 slices shown, 18 images]
[im 5/86  soft-tissue]
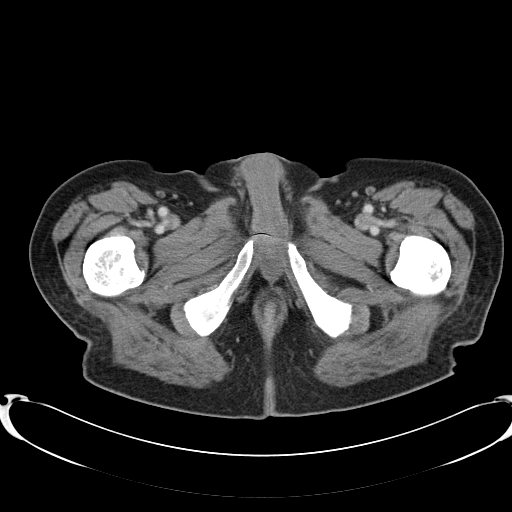
[im 5/86  bone]
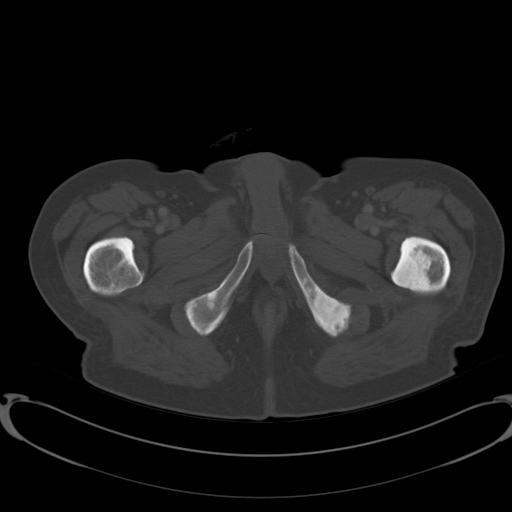
[im 13/86  soft-tissue]
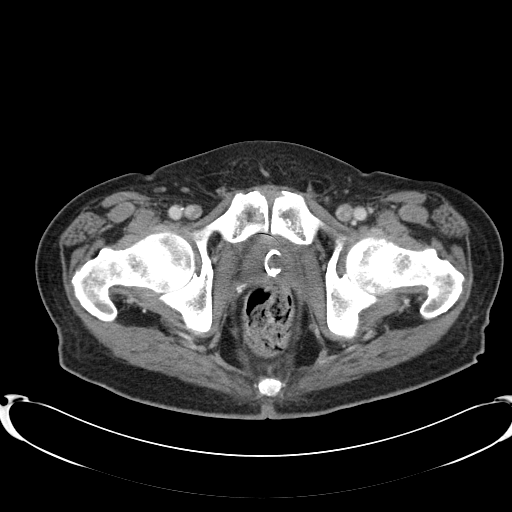
[im 18/86  soft-tissue]
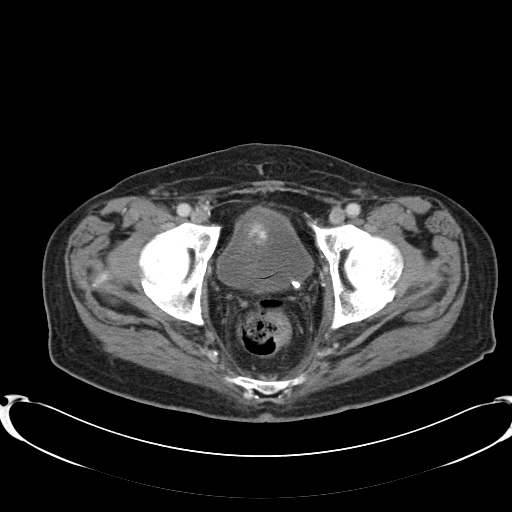
[im 26/86  soft-tissue]
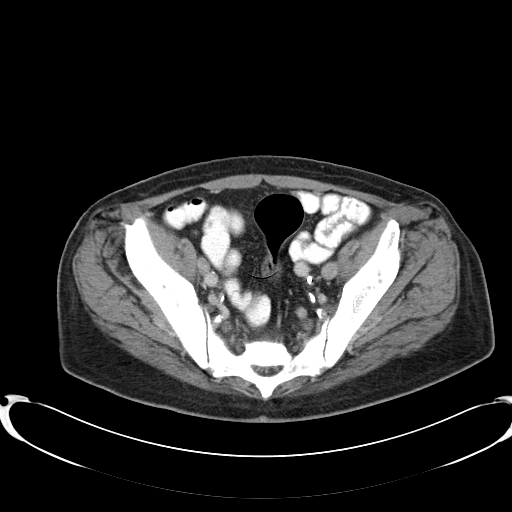
[im 35/86  soft-tissue]
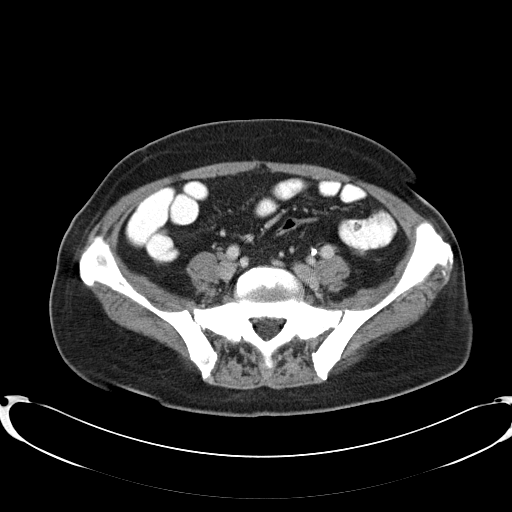
[im 39/86  soft-tissue]
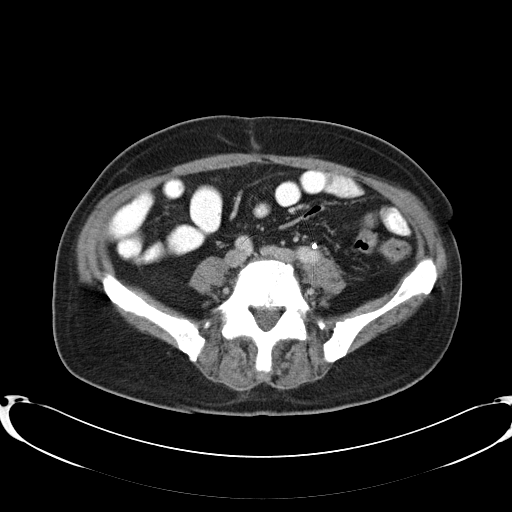
[im 47/86  soft-tissue]
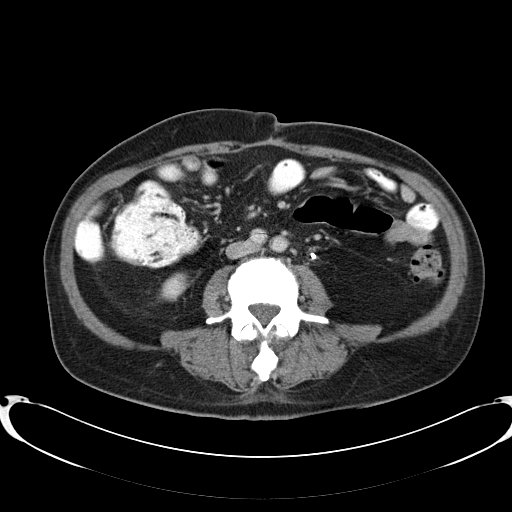
[im 52/86  soft-tissue]
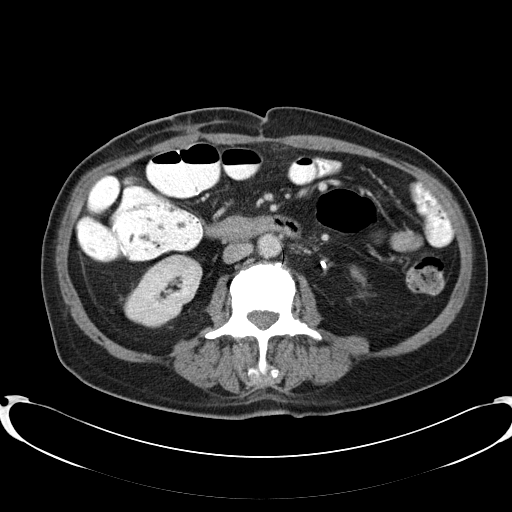
[im 60/86  soft-tissue]
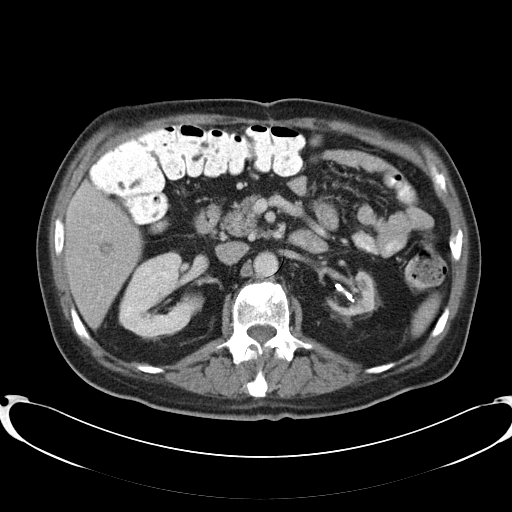
[im 60/86  bone]
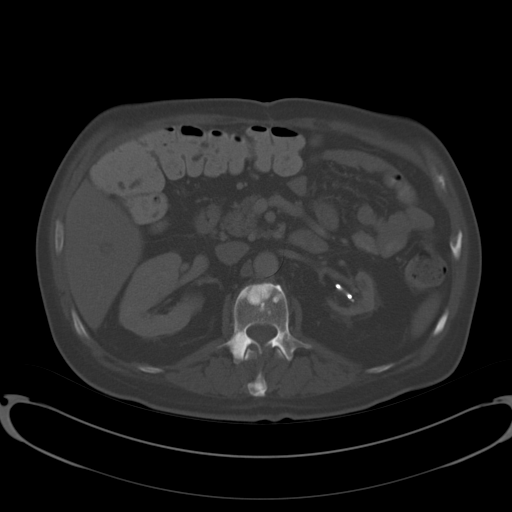
[im 69/86  soft-tissue]
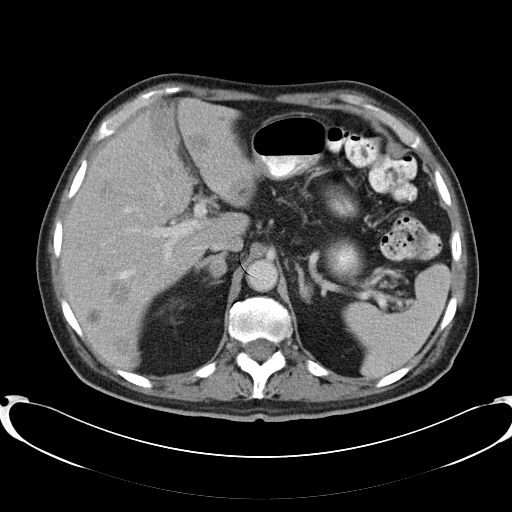
[im 69/86  lung]
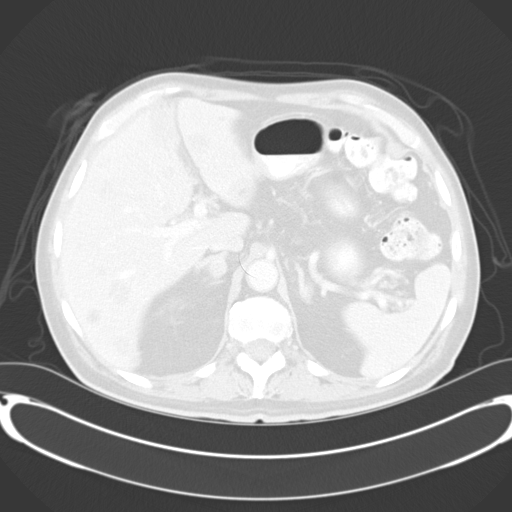
[im 73/86  soft-tissue]
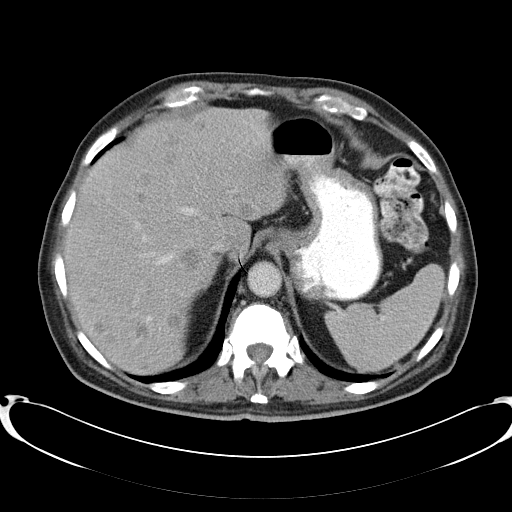
[im 73/86  lung]
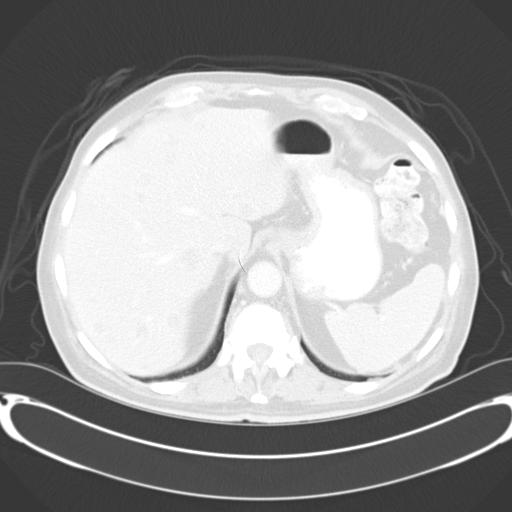
[im 77/86  lung]
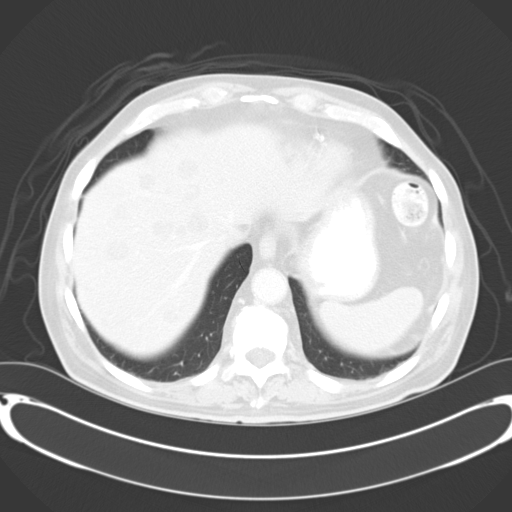
[im 81/86  soft-tissue]
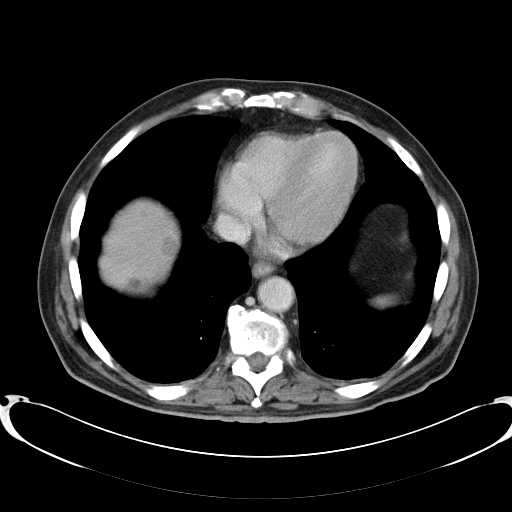
[im 81/86  lung]
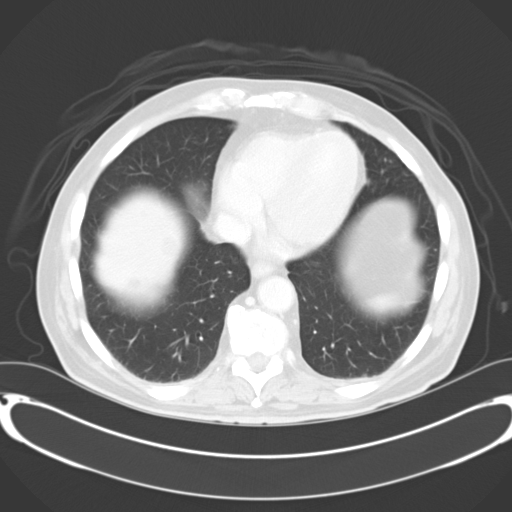

[13 of 32 positions shown; findings below may reference images not displayed]

FINDINGS: Lung Bases: Calcifications of the aortic valve. Atherosclerotic
calcifications in the distal left circumflex, left anterior
descending and right coronary arteries.

Abdomen/Pelvis: Compared to the prior examination there has been a
significant increase in the number and size of numerous hepatic
nodules and masses. The largest single lesion on today's examination
is within segment 4 of the liver measuring approximately 4.0 x
cm (image 17 of series 2). The appearance of the gallbladder,
pancreas, spleen, left adrenal gland and right kidney is
unremarkable. 1.2 cm nodule in the medial limb of the right adrenal
gland is unchanged. Severe left renal atrophy, similar to the prior
study. 1.2 cm exophytic low-attenuation lesion extending off the
upper pole of the left kidney is unchanged, compatible with a simple
cyst. Left-sided double-J ureteral stent in position extending from
the left renal collecting system to the urinary bladder.

Atherosclerosis throughout the abdominal and pelvic vasculature,
without evidence aneurysm or dissection. No significant volume of
ascites. No pneumoperitoneum. No pathologic distention of small
bowel. Status post appendectomy. Status post radical prostatectomy.
No definite lymphadenopathy identified within the abdomen or pelvis.

Musculoskeletal: Multifocal sclerotic lesions again noted throughout
the axial and appendicular skeleton, very similar to the prior
examinations, compatible with widespread bony metastatic disease. No
definite pathologic fractures identified within the visualized
portions of the skeleton at this time.
IMPRESSION: 1. Today's study demonstrates progression of metastatic disease with
increased number and size of numerous hepatic lesions, as discussed
above.
2. Widespread skeletal metastatic disease appears very similar to
the prior study. No definite pathologic fractures are identified on
today's examination.
3. Unchanged indeterminate 1.2 cm right adrenal nodule.
4. Additional incidental findings, as above, similar prior studies.
